# Patient Record
Sex: Male | Born: 1940
Health system: Southern US, Community
[De-identification: ages and names within clinical notes are randomized; demographics above are authoritative.]

## PROBLEM LIST (undated history)

## (undated) DIAGNOSIS — I10 Essential (primary) hypertension: Secondary | ICD-10-CM

## (undated) DIAGNOSIS — C8590 Non-Hodgkin lymphoma, unspecified, unspecified site: Secondary | ICD-10-CM

## (undated) DIAGNOSIS — I639 Cerebral infarction, unspecified: Secondary | ICD-10-CM

## (undated) DIAGNOSIS — K219 Gastro-esophageal reflux disease without esophagitis: Secondary | ICD-10-CM

## (undated) DIAGNOSIS — C801 Malignant (primary) neoplasm, unspecified: Secondary | ICD-10-CM

## (undated) DIAGNOSIS — E7800 Pure hypercholesterolemia, unspecified: Secondary | ICD-10-CM

## (undated) DIAGNOSIS — I509 Heart failure, unspecified: Secondary | ICD-10-CM

## (undated) DIAGNOSIS — F0280 Dementia in other diseases classified elsewhere without behavioral disturbance: Secondary | ICD-10-CM

## (undated) HISTORY — DX: Heart failure, unspecified: I50.9

## (undated) HISTORY — PX: PROSTATE SURGERY: SHX751

## (undated) HISTORY — PX: EYE SURGERY: SHX253

## (undated) HISTORY — DX: Non-Hodgkin lymphoma, unspecified, unspecified site: C85.90

## (undated) HISTORY — PX: APPENDECTOMY: SHX54

## (undated) HISTORY — PX: TONSILLECTOMY: SUR1361

## (undated) HISTORY — PX: PACEMAKER INSERTION: SHX728

---

## 1998-12-14 ENCOUNTER — Encounter: Payer: Self-pay | Admitting: Cardiology

## 1998-12-14 ENCOUNTER — Encounter: Payer: Self-pay | Admitting: Emergency Medicine

## 1998-12-14 ENCOUNTER — Observation Stay (HOSPITAL_COMMUNITY): Admission: EM | Admit: 1998-12-14 | Discharge: 1998-12-14 | Payer: Self-pay | Admitting: Emergency Medicine

## 2012-07-06 ENCOUNTER — Emergency Department (HOSPITAL_BASED_OUTPATIENT_CLINIC_OR_DEPARTMENT_OTHER): Admission: EM | Admit: 2012-07-06 | Discharge: 2012-07-06 | Discharge status: Against Medical Advice | Payer: MEDICARE

## 2012-07-06 ENCOUNTER — Encounter (HOSPITAL_BASED_OUTPATIENT_CLINIC_OR_DEPARTMENT_OTHER): Payer: Self-pay | Admitting: *Deleted

## 2012-07-06 ENCOUNTER — Emergency Department (HOSPITAL_BASED_OUTPATIENT_CLINIC_OR_DEPARTMENT_OTHER): Payer: Medicare Other

## 2012-07-06 ENCOUNTER — Emergency Department (HOSPITAL_BASED_OUTPATIENT_CLINIC_OR_DEPARTMENT_OTHER)
Admission: EM | Admit: 2012-07-06 | Discharge: 2012-07-07 | Disposition: A | Discharge status: Home / Self Care | Payer: Medicare Other | Attending: Emergency Medicine | Admitting: Emergency Medicine

## 2012-07-06 DIAGNOSIS — R011 Cardiac murmur, unspecified: Secondary | ICD-10-CM | POA: Insufficient documentation

## 2012-07-06 DIAGNOSIS — I10 Essential (primary) hypertension: Secondary | ICD-10-CM | POA: Insufficient documentation

## 2012-07-06 DIAGNOSIS — R109 Unspecified abdominal pain: Secondary | ICD-10-CM | POA: Insufficient documentation

## 2012-07-06 DIAGNOSIS — N201 Calculus of ureter: Secondary | ICD-10-CM | POA: Insufficient documentation

## 2012-07-06 DIAGNOSIS — E119 Type 2 diabetes mellitus without complications: Secondary | ICD-10-CM | POA: Insufficient documentation

## 2012-07-06 DIAGNOSIS — Z79899 Other long term (current) drug therapy: Secondary | ICD-10-CM | POA: Insufficient documentation

## 2012-07-06 HISTORY — DX: Cerebral infarction, unspecified: I63.9

## 2012-07-06 HISTORY — DX: Pure hypercholesterolemia, unspecified: E78.00

## 2012-07-06 HISTORY — DX: Gastro-esophageal reflux disease without esophagitis: K21.9

## 2012-07-06 HISTORY — DX: Essential (primary) hypertension: I10

## 2012-07-06 HISTORY — DX: Malignant (primary) neoplasm, unspecified: C80.1

## 2012-07-06 LAB — CBC WITH DIFFERENTIAL/PLATELET
Basophils Absolute: 0 10*3/uL (ref 0.0–0.1)
Basophils Relative: 0 % (ref 0–1)
Eosinophils Absolute: 0.2 10*3/uL (ref 0.0–0.7)
Eosinophils Relative: 2 % (ref 0–5)
HCT: 39.6 % (ref 39.0–52.0)
Hemoglobin: 13.1 g/dL (ref 13.0–17.0)
Lymphocytes Relative: 15 % (ref 12–46)
Lymphs Abs: 1.1 10*3/uL (ref 0.7–4.0)
MCH: 27.3 pg (ref 26.0–34.0)
MCHC: 33.1 g/dL (ref 30.0–36.0)
MCV: 82.7 fL (ref 78.0–100.0)
Monocytes Absolute: 0.5 10*3/uL (ref 0.1–1.0)
Monocytes Relative: 7 % (ref 3–12)
Neutro Abs: 5.8 10*3/uL (ref 1.7–7.7)
Neutrophils Relative %: 76 % (ref 43–77)
Platelets: 235 10*3/uL (ref 150–400)
RBC: 4.79 MIL/uL (ref 4.22–5.81)
RDW: 14.7 % (ref 11.5–15.5)
WBC: 7.6 10*3/uL (ref 4.0–10.5)

## 2012-07-06 LAB — COMPREHENSIVE METABOLIC PANEL
ALT: 16 U/L (ref 0–53)
AST: 20 U/L (ref 0–37)
Albumin: 4.6 g/dL (ref 3.5–5.2)
Alkaline Phosphatase: 55 U/L (ref 39–117)
BUN: 22 mg/dL (ref 6–23)
CO2: 24 mEq/L (ref 19–32)
Calcium: 10.3 mg/dL (ref 8.4–10.5)
Chloride: 101 mEq/L (ref 96–112)
Creatinine, Ser: 1 mg/dL (ref 0.50–1.35)
GFR calc Af Amer: 87 mL/min — ABNORMAL LOW (ref >90)
GFR calc non Af Amer: 75 mL/min — ABNORMAL LOW (ref >90)
Glucose, Bld: 181 mg/dL — ABNORMAL HIGH (ref 70–99)
Potassium: 3.8 mEq/L (ref 3.5–5.1)
Sodium: 138 mEq/L (ref 135–145)
Total Bilirubin: 0.4 mg/dL (ref 0.3–1.2)
Total Protein: 8.3 g/dL (ref 6.0–8.3)

## 2012-07-06 LAB — URINALYSIS, ROUTINE W REFLEX MICROSCOPIC
Bilirubin Urine: NEGATIVE
Glucose, UA: NEGATIVE mg/dL
Ketones, ur: NEGATIVE mg/dL
Leukocytes, UA: NEGATIVE
Nitrite: NEGATIVE
Protein, ur: >300 mg/dL — AB
Specific Gravity, Urine: 1.018 (ref 1.005–1.030)
Urobilinogen, UA: 0.2 mg/dL (ref 0.0–1.0)
pH: 5.5 (ref 5.0–8.0)

## 2012-07-06 LAB — LIPASE, BLOOD: Lipase: 38 U/L (ref 11–59)

## 2012-07-06 LAB — URINE MICROSCOPIC-ADD ON

## 2012-07-06 MED ORDER — ONDANSETRON 8 MG PO TBDP: 8.0000 mg | ORAL_TABLET | Freq: Three times a day (TID) | ORAL | Status: DC | PRN

## 2012-07-06 MED ORDER — HYDROCODONE-ACETAMINOPHEN 5-325 MG PO TABS: 1.0000 | ORAL_TABLET | Freq: Four times a day (QID) | ORAL | Status: DC | PRN

## 2012-07-06 MED ORDER — SODIUM CHLORIDE 0.9 % IV SOLN
1000.0000 mL | INTRAVENOUS | Status: DC
  Administered 2012-07-06 (×2): 1000 mL via INTRAVENOUS

## 2012-07-06 MED ORDER — KETOROLAC TROMETHAMINE 30 MG/ML IJ SOLN
15.0000 mg | Freq: Once | INTRAMUSCULAR | Status: AC
  Administered 2012-07-06: 15 mg via INTRAVENOUS
  Filled 2012-07-06: qty 1

## 2012-07-06 MED ORDER — TAMSULOSIN HCL 0.4 MG PO CAPS: 0.4000 mg | ORAL_CAPSULE | Freq: Every day | ORAL | Status: DC

## 2012-07-06 NOTE — ED Notes (Signed)
Left lower quad abdominal pain x 3 hours. Sudden onset.

## 2012-07-06 NOTE — ED Provider Notes (Signed)
History     CSN: [id number]  Arrival date & time 07/06/12  2016   First MD Initiated Contact with Patient 07/06/12 2119      Chief Complaint  Patient presents with  . Abdominal Pain     HPI Comments: It started this evening before he started having dinner.  The pain has been located in the left lower abdomen.  Patient is a 71 y.o. male presenting with abdominal pain. The history is provided by the patient.  Abdominal Pain The primary symptoms of the illness include abdominal pain. The primary symptoms of the illness do not include fever, fatigue, vomiting, diarrhea or dysuria. The current episode started 3 to 5 hours ago. The onset of the illness was sudden. The problem has been gradually improving.  The abdominal pain is located in the LLQ. The abdominal pain does not radiate. The abdominal pain is relieved by nothing.  Additional symptoms associated with the illness include frequency. Symptoms associated with the illness do not include anorexia or back pain.  Pt has had kidney stones in the past.  Past Medical History  Diagnosis Date  . Hypertension   . High cholesterol   . Diabetes mellitus   . Stroke   . GERD (gastroesophageal reflux disease)   . Cancer     Past Surgical History  Procedure Date  . Prostate surgery   . Appendectomy   . Tonsillectomy     No family history on file.  History  Substance Use Topics  . Smoking status: Never Smoker   . Smokeless tobacco: Not on file  . Alcohol Use: Yes      Review of Systems  Constitutional: Negative for fever and fatigue.  Gastrointestinal: Positive for abdominal pain. Negative for vomiting, diarrhea and anorexia.  Genitourinary: Positive for frequency. Negative for dysuria.  Musculoskeletal: Negative for back pain.  All other systems reviewed and are negative.    Allergies  Review of patient's allergies indicates no known allergies.  Home Medications   Current Outpatient Rx  Name Route Sig Dispense  Refill  . AMLODIPINE BESYLATE 10 MG PO TABS Oral Take 10 mg by mouth daily.    Marland Kitchen CARVEDILOL 12.5 MG PO TABS Oral Take 12.5 mg by mouth 2 (two) times daily with a meal.    . FENOFIBRATE 160 MG PO TABS Oral Take 160 mg by mouth daily.    Marland Kitchen GLIPIZIDE 10 MG PO TABS Oral Take 10 mg by mouth 2 (two) times daily before a meal.    . LISINOPRIL-HYDROCHLOROTHIAZIDE 20-25 MG PO TABS Oral Take 1 tablet by mouth daily.    . MELOXICAM 7.5 MG PO TABS Oral Take 7.5 mg by mouth daily.    Marland Kitchen METFORMIN HCL ER (MOD) 1000 MG PO TB24 Oral Take 1,000 mg by mouth daily with breakfast.    . OSTEO BI-FLEX JOINT SHIELD PO TABS Oral Take 2 tablets by mouth daily.    Marland Kitchen ONE-DAILY MULTI VITAMINS PO TABS Oral Take 1 tablet by mouth daily.    Marland Kitchen FISH OIL PO Oral Take 2 capsules by mouth daily.    . SELENIUM 50 MCG PO TABS Oral Take 50 mcg by mouth daily.    Marland Kitchen SIMVASTATIN 40 MG PO TABS Oral Take 40 mg by mouth every evening.      BP 191/94  Pulse 78  Temp 98.1 F (36.7 C) (Oral)  Resp 20  SpO2 99%  Physical Exam  Nursing note and vitals reviewed. Constitutional: He appears well-developed and well-nourished. No  distress.  HENT:  Head: Normocephalic and atraumatic.  Right Ear: External ear normal.  Left Ear: External ear normal.  Eyes: Conjunctivae normal are normal. Right eye exhibits no discharge. Left eye exhibits no discharge. No scleral icterus.  Neck: Neck supple. No tracheal deviation present.  Cardiovascular: Normal rate, regular rhythm and intact distal pulses.   Murmur heard.      Extra systoles   Pulmonary/Chest: Effort normal and breath sounds normal. No stridor. No respiratory distress. He has no wheezes. He has no rales.  Abdominal: Soft. Bowel sounds are normal. He exhibits no distension. There is no tenderness. There is no rebound and no guarding.  Musculoskeletal: He exhibits no edema and no tenderness.  Neurological: He is alert. He has normal strength. No sensory deficit. Cranial nerve deficit:   no gross defecits noted. He exhibits normal muscle tone. He displays no seizure activity. Coordination normal.  Skin: Skin is warm and dry. No rash noted.  Psychiatric: He has a normal mood and affect.    ED Course  Procedures (including critical care time)  Medications  glipiZIDE (GLUCOTROL) 10 MG tablet (not administered)  metFORMIN (GLUMETZA) 1000 MG (MOD) 24 hr tablet (not administered)  carvedilol (COREG) 12.5 MG tablet (not administered)  amLODipine (NORVASC) 10 MG tablet (not administered)  simvastatin (ZOCOR) 40 MG tablet (not administered)  meloxicam (MOBIC) 7.5 MG tablet (not administered)  lisinopril-hydrochlorothiazide (PRINZIDE,ZESTORETIC) 20-25 MG per tablet (not administered)  fenofibrate 160 MG tablet (not administered)  Omega-3 Fatty Acids (FISH OIL PO) (not administered)  Multiple Vitamin (MULTIVITAMIN) tablet (not administered)  selenium 50 MCG TABS (not administered)  Misc Natural Products (OSTEO BI-FLEX JOINT SHIELD) TABS (not administered)  0.9 %  sodium chloride infusion (1000 mL Intravenous New Bag/Given 07/06/12 2150)  ketorolac (TORADOL) 30 MG/ML injection 15 mg (not administered)  HYDROcodone-acetaminophen (NORCO) 5-325 MG per tablet (not administered)  ondansetron (ZOFRAN ODT) 8 MG disintegrating tablet (not administered)  Tamsulosin HCl (FLOMAX) 0.4 MG CAPS (not administered)    Labs Reviewed  URINALYSIS, ROUTINE W REFLEX MICROSCOPIC - Abnormal; Notable for the following:    Hgb urine dipstick SMALL (*)     Protein, ur >300 (*)     All other components within normal limits  COMPREHENSIVE METABOLIC PANEL - Abnormal; Notable for the following:    Glucose, Bld 181 (*)     GFR calc non Af Amer 75 (*)     GFR calc Af Amer 87 (*)     All other components within normal limits  URINE MICROSCOPIC-ADD ON  LIPASE, BLOOD  CBC WITH DIFFERENTIAL   Ct Abdomen Pelvis Wo Contrast  07/06/2012  *RADIOLOGY REPORT*  Clinical Data: Lower abdominal pain.  CT ABDOMEN  AND PELVIS WITHOUT CONTRAST  Technique:  Multidetector CT imaging of the abdomen and pelvis was performed following the standard protocol without intravenous contrast.  Comparison: None.  Findings: The lung bases are clear.  No pleural or pericardial effusion.   The patient has moderate to moderately severe left hydronephrosis with stranding about the left kidney and ureter due to a 0.5 cm stone just proximal to the UVJ.  The patient has three additional nonobstructing left renal stones measuring 0.3-0.5 cm.  No right renal stones are identified.  The kidneys are otherwise unremarkable.  The gallbladder, liver, adrenal glands, spleen and pancreas appear normal.  Extensive atherosclerosis is seen in a nonaneurysmal aorta.  Urinary bladder is unremarkable.  Surgical clips in the pelvis are identified with the patient status post prostatectomy. The stomach  and small bowel and large bowel are unremarkable. There is no focal bony abnormality with degenerative changes seen in the lower lumbar spine.  IMPRESSION:  1.  Moderate to moderately severe left hydronephrosis due to a 0.5 cm stone just proximal to the UVJ.  Three nonobstructing stones are identified in the left kidney. 2.  Extensive atherosclerosis. 3.  Status post prostatectomy.   Original Report Authenticated By: Arvid Right. D'ALESSIO, M.D.      1. Ureteral stone       MDM  Pt has left sided ureteral stone.  Discussed findings with the patient.  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  Will dc home with pain medications, flomax and antinausea medications.   Pt instructed to call his urologist tomorrow.        Kathalene Frames, MD 07/06/12 [phone]

## 2012-08-31 HISTORY — PX: PACEMAKER INSERTION: SHX728

## 2015-10-29 DIAGNOSIS — K5730 Diverticulosis of large intestine without perforation or abscess without bleeding: Secondary | ICD-10-CM | POA: Insufficient documentation

## 2015-10-29 DIAGNOSIS — R0989 Other specified symptoms and signs involving the circulatory and respiratory systems: Secondary | ICD-10-CM | POA: Insufficient documentation

## 2015-10-29 DIAGNOSIS — Z8673 Personal history of transient ischemic attack (TIA), and cerebral infarction without residual deficits: Secondary | ICD-10-CM | POA: Insufficient documentation

## 2015-10-29 DIAGNOSIS — M179 Osteoarthritis of knee, unspecified: Secondary | ICD-10-CM | POA: Insufficient documentation

## 2015-10-29 DIAGNOSIS — M5137 Other intervertebral disc degeneration, lumbosacral region: Secondary | ICD-10-CM | POA: Insufficient documentation

## 2015-10-29 DIAGNOSIS — M25561 Pain in right knee: Secondary | ICD-10-CM | POA: Insufficient documentation

## 2015-10-29 DIAGNOSIS — Z961 Presence of intraocular lens: Secondary | ICD-10-CM | POA: Insufficient documentation

## 2015-10-29 DIAGNOSIS — M1710 Unilateral primary osteoarthritis, unspecified knee: Secondary | ICD-10-CM | POA: Insufficient documentation

## 2015-10-29 DIAGNOSIS — E782 Mixed hyperlipidemia: Secondary | ICD-10-CM | POA: Insufficient documentation

## 2015-10-29 DIAGNOSIS — I10 Essential (primary) hypertension: Secondary | ICD-10-CM | POA: Insufficient documentation

## 2015-10-29 DIAGNOSIS — I442 Atrioventricular block, complete: Secondary | ICD-10-CM | POA: Insufficient documentation

## 2015-10-29 DIAGNOSIS — Z86018 Personal history of other benign neoplasm: Secondary | ICD-10-CM | POA: Insufficient documentation

## 2015-10-29 DIAGNOSIS — H9190 Unspecified hearing loss, unspecified ear: Secondary | ICD-10-CM | POA: Insufficient documentation

## 2015-10-29 DIAGNOSIS — Z8546 Personal history of malignant neoplasm of prostate: Secondary | ICD-10-CM | POA: Insufficient documentation

## 2015-10-29 DIAGNOSIS — E781 Pure hyperglyceridemia: Secondary | ICD-10-CM | POA: Insufficient documentation

## 2015-10-29 DIAGNOSIS — M47812 Spondylosis without myelopathy or radiculopathy, cervical region: Secondary | ICD-10-CM | POA: Insufficient documentation

## 2015-10-30 DIAGNOSIS — E781 Pure hyperglyceridemia: Secondary | ICD-10-CM | POA: Diagnosis not present

## 2015-10-30 DIAGNOSIS — I10 Essential (primary) hypertension: Secondary | ICD-10-CM | POA: Diagnosis not present

## 2015-10-30 DIAGNOSIS — E119 Type 2 diabetes mellitus without complications: Secondary | ICD-10-CM | POA: Diagnosis not present

## 2015-10-31 DIAGNOSIS — E781 Pure hyperglyceridemia: Secondary | ICD-10-CM | POA: Diagnosis not present

## 2015-12-19 DIAGNOSIS — Z85828 Personal history of other malignant neoplasm of skin: Secondary | ICD-10-CM | POA: Diagnosis not present

## 2015-12-19 DIAGNOSIS — Z08 Encounter for follow-up examination after completed treatment for malignant neoplasm: Secondary | ICD-10-CM | POA: Diagnosis not present

## 2016-01-21 DIAGNOSIS — I498 Other specified cardiac arrhythmias: Secondary | ICD-10-CM | POA: Diagnosis not present

## 2016-03-11 DIAGNOSIS — E119 Type 2 diabetes mellitus without complications: Secondary | ICD-10-CM | POA: Diagnosis not present

## 2016-03-11 DIAGNOSIS — R413 Other amnesia: Secondary | ICD-10-CM | POA: Diagnosis not present

## 2016-03-11 DIAGNOSIS — I10 Essential (primary) hypertension: Secondary | ICD-10-CM | POA: Diagnosis not present

## 2016-03-25 DIAGNOSIS — R413 Other amnesia: Secondary | ICD-10-CM | POA: Diagnosis not present

## 2016-04-11 ENCOUNTER — Encounter (HOSPITAL_BASED_OUTPATIENT_CLINIC_OR_DEPARTMENT_OTHER): Payer: Self-pay

## 2016-04-11 ENCOUNTER — Emergency Department (HOSPITAL_BASED_OUTPATIENT_CLINIC_OR_DEPARTMENT_OTHER)
Admission: EM | Admit: 2016-04-11 | Discharge: 2016-04-11 | Disposition: A | Discharge status: Home / Self Care | Payer: PPO | Attending: Emergency Medicine | Admitting: Emergency Medicine

## 2016-04-11 ENCOUNTER — Other Ambulatory Visit: Payer: Self-pay

## 2016-04-11 DIAGNOSIS — Z7984 Long term (current) use of oral hypoglycemic drugs: Secondary | ICD-10-CM | POA: Diagnosis not present

## 2016-04-11 DIAGNOSIS — Z8673 Personal history of transient ischemic attack (TIA), and cerebral infarction without residual deficits: Secondary | ICD-10-CM | POA: Insufficient documentation

## 2016-04-11 DIAGNOSIS — H81399 Other peripheral vertigo, unspecified ear: Secondary | ICD-10-CM | POA: Diagnosis not present

## 2016-04-11 DIAGNOSIS — Z79899 Other long term (current) drug therapy: Secondary | ICD-10-CM | POA: Insufficient documentation

## 2016-04-11 DIAGNOSIS — I10 Essential (primary) hypertension: Secondary | ICD-10-CM | POA: Diagnosis not present

## 2016-04-11 DIAGNOSIS — Z87891 Personal history of nicotine dependence: Secondary | ICD-10-CM | POA: Insufficient documentation

## 2016-04-11 DIAGNOSIS — R42 Dizziness and giddiness: Secondary | ICD-10-CM | POA: Diagnosis present

## 2016-04-11 DIAGNOSIS — E119 Type 2 diabetes mellitus without complications: Secondary | ICD-10-CM | POA: Diagnosis not present

## 2016-04-11 LAB — CBG MONITORING, ED: Glucose-Capillary: 216 mg/dL — ABNORMAL HIGH (ref 65–99)

## 2016-04-11 MED ORDER — MECLIZINE HCL 25 MG PO TABS
25.0000 mg | ORAL_TABLET | Freq: Once | ORAL | Status: AC
  Administered 2016-04-11: 25 mg via ORAL
  Filled 2016-04-11: qty 1

## 2016-04-11 MED ORDER — MECLIZINE HCL 25 MG PO TABS: 25.0000 mg | ORAL_TABLET | Freq: Three times a day (TID) | ORAL | Status: DC | PRN

## 2016-04-11 NOTE — ED Provider Notes (Signed)
CSN: [id number]     Arrival date & time 04/11/16  2040 History  By signing my name below, I, Roxine Caddy, attest that this documentation has been prepared under the direction and in the presence of Deno Etienne, DO.  Electronically signed: Roxine Caddy, ED Scribe. 04/11/2016. 10:08 PM.   Chief Complaint  Patient presents with  . Dizziness   The history is provided by the patient. No language interpreter was used.   HPI Comments: Kenneth Lyons is a 75 y.o. male with PMHx of HTN, high cholesterol, DM, and stroke who presents to the Emergency Department complaining of sudden onset, intermittment dizziness for the past 4-5 days.Pt reports associated vomiting (x3) which began around 6:30 this evening. He states he has been able to hold food and drink down aside from vomiting earlier today. Pt states dizziness occurs when bending over or standing up. He states the only time symptoms do not occur is when he is sitting still. Pt notes he hit his head on a car door 2 weeks ago.He denies cough, congestion, SOB, diarrhea, headache, neck pain, chest pain, or abdominal pain. Pt has a pacemaker inserted.  Past Medical History  Diagnosis Date  . Hypertension   . High cholesterol   . Diabetes mellitus   . Stroke ([place])   . GERD (gastroesophageal reflux disease)   . Cancer [facility])    Past Surgical History  Procedure Laterality Date  . Prostate surgery    . Appendectomy    . Tonsillectomy    . Pacemaker insertion     No family history on file. Social History  Substance Use Topics  . Smoking status: Former Research scientist (life sciences)  . Smokeless tobacco: None  . Alcohol Use: Yes     Comment: occ    Review of Systems  Constitutional: Negative for fever and chills.  HENT: Negative for congestion and facial swelling.   Eyes: Negative for discharge and visual disturbance.  Respiratory: Negative for cough and shortness of breath.   Cardiovascular: Negative for chest pain and palpitations.  Gastrointestinal: Positive for  vomiting. Negative for abdominal pain and diarrhea.  Musculoskeletal: Negative for myalgias, arthralgias and neck pain.  Skin: Negative for color change and rash.  Neurological: Positive for dizziness. Negative for tremors, syncope and headaches.  Psychiatric/Behavioral: Negative for confusion and dysphoric mood.   Allergies  Review of patient's allergies indicates no known allergies.  Home Medications   Prior to Admission medications   Medication Sig Start Date End Date Taking? Authorizing Provider  amLODipine (NORVASC) 10 MG tablet Take 10 mg by mouth daily.    Historical Provider, MD  carvedilol (COREG) 12.5 MG tablet Take 12.5 mg by mouth 2 (two) times daily with a meal.    Historical Provider, MD  fenofibrate 160 MG tablet Take 160 mg by mouth daily.    Historical Provider, MD  glipiZIDE (GLUCOTROL) 10 MG tablet Take 10 mg by mouth 2 (two) times daily before a meal.    Historical Provider, MD  lisinopril-hydrochlorothiazide (PRINZIDE,ZESTORETIC) 20-25 MG per tablet Take 1 tablet by mouth daily.    Historical Provider, MD  meclizine (ANTIVERT) 25 MG tablet Take 1 tablet (25 mg total) by mouth 3 (three) times daily as needed for dizziness. 04/11/16   Deno Etienne, DO  meloxicam (MOBIC) 7.5 MG tablet Take 7.5 mg by mouth daily.    Historical Provider, MD  metFORMIN (GLUMETZA) 1000 MG (MOD) 24 hr tablet Take 1,000 mg by mouth daily with breakfast.    Historical Provider, MD  Misc Natural Products (OSTEO BI-FLEX JOINT SHIELD) TABS Take 2 tablets by mouth daily.    Historical Provider, MD  Multiple Vitamin (MULTIVITAMIN) tablet Take 1 tablet by mouth daily.    Historical Provider, MD  Omega-3 Fatty Acids (FISH OIL PO) Take 2 capsules by mouth daily.    Historical Provider, MD  selenium 50 MCG TABS Take 50 mcg by mouth daily.    Historical Provider, MD  simvastatin (ZOCOR) 40 MG tablet Take 40 mg by mouth every evening.    Historical Provider, MD  Tamsulosin HCl (FLOMAX) 0.4 MG CAPS Take 1  capsule (0.4 mg total) by mouth daily. 07/06/12   Dorie Rank, MD   BP 170/72 mmHg  Pulse 71  Temp(Src) 98.1 F (36.7 C) (Oral)  Resp 18  Ht 5\' 7"  (1.702 m)  Wt 190 lb (86.183 kg)  BMI 29.75 kg/m2  SpO2 99% Physical Exam  Constitutional: He is oriented to person, place, and time. He appears well-developed and well-nourished.  HENT:  Head: Normocephalic and atraumatic.  Eyes: EOM are normal. Pupils are equal, round, and reactive to light.  Neck: Normal range of motion. Neck supple. No JVD present.  Cardiovascular: Normal rate and regular rhythm.  Exam reveals no gallop and no friction rub.   No murmur heard. Pulmonary/Chest: No respiratory distress. He has no wheezes.  Abdominal: He exhibits no distension. There is no rebound and no guarding.  Musculoskeletal: Normal range of motion.  Neurological: He is alert and oriented to person, place, and time.  Benign neurological exam.  Skin: No rash noted. No pallor.  Psychiatric: He has a normal mood and affect. His behavior is normal.  Nursing note and vitals reviewed.   ED Course  Procedures  DIAGNOSTIC STUDIES: Oxygen Saturation is 100% on RA, normal by my interpretation.  COORDINATION OF CARE: 9:44 PM Discussed treatment plan which includes obtaining a EKG, CBG monitoring, and prescribing meclizine with pt at bedside and pt agreed to plan.  Labs Review Labs Reviewed  CBG MONITORING, ED - Abnormal; Notable for the following:    Glucose-Capillary 216 (*)    All other components within normal limits   Imaging Review No results found. I have personally reviewed and evaluated these images and lab results as part of my medical decision-making.   EKG Interpretation None      MDM   Final diagnoses:  Peripheral vertigo, unspecified laterality    75 yo M With a chief complaints of unsteadiness on his feet. Visit come and go with head movement. Patient currently has no symptoms. Benign neurologic exam. Suspect this is likely  peripheral in nature. Follow-up with family doctor. Start on meclizine.  I personally performed the services described in this documentation, which was scribed in my presence. The recorded information has been reviewed and is accurate.  11:32 PM:  I have discussed the diagnosis/risks/treatment options with the patient and family and believe the pt to be eligible for discharge home to follow-up with PCP. We also discussed returning to the ED immediately if new or worsening sx occur. We discussed the sx which are most concerning (e.g., sudden worsening pain, fever, inability to tolerate by mouth) that necessitate immediate return. Medications administered to the patient during their visit and any new prescriptions provided to the patient are listed below.  Medications given during this visit Medications  meclizine (ANTIVERT) tablet 25 mg (25 mg Oral Given 04/11/16 2219)    Discharge Medication List as of 04/11/2016 10:09 PM    START taking  these medications   Details  meclizine (ANTIVERT) 25 MG tablet Take 1 tablet (25 mg total) by mouth 3 (three) times daily as needed for dizziness., Starting 04/11/2016, Until Discontinued, Print        The patient appears reasonably screen and/or stabilized for discharge and I doubt any other medical condition or other [facility] requiring further screening, evaluation, or treatment in the ED at this time prior to discharge.     Deno Etienne, DO 04/11/16 2333

## 2016-04-11 NOTE — ED Notes (Signed)
Dizziness x 3-4 days-denies HA, CP-NAD-reports steady gait-was brought to triage via w/c

## 2016-04-11 NOTE — Discharge Instructions (Signed)
Benign Positional Vertigo Vertigo is the feeling that you or your surroundings are moving when they are not. Benign positional vertigo is the most common form of vertigo. The cause of this condition is not serious (is benign). This condition is triggered by certain movements and positions (is positional). This condition can be dangerous if it occurs while you are doing something that could endanger you or others, such as driving.  CAUSES In many cases, the cause of this condition is not known. It may be caused by a disturbance in an area of the inner ear that helps your brain to sense movement and balance. This disturbance can be caused by a viral infection (labyrinthitis), head injury, or repetitive motion. RISK FACTORS This condition is more likely to develop in:  Women.  People who are 50 years of age or older. SYMPTOMS Symptoms of this condition usually happen when you move your head or your eyes in different directions. Symptoms may start suddenly, and they usually last for less than a minute. Symptoms may include:  Loss of balance and falling.  Feeling like you are spinning or moving.  Feeling like your surroundings are spinning or moving.  Nausea and vomiting.  Blurred vision.  Dizziness.  Involuntary eye movement (nystagmus). Symptoms can be mild and cause only slight annoyance, or they can be severe and interfere with daily life. Episodes of benign positional vertigo may return (recur) over time, and they may be triggered by certain movements. Symptoms may improve over time. DIAGNOSIS This condition is usually diagnosed by medical history and a physical exam of the head, neck, and ears. You may be referred to a health care provider who specializes in ear, nose, and throat (ENT) problems (otolaryngologist) or a provider who specializes in disorders of the nervous system (neurologist). You may have additional testing, including:  MRI.  A CT scan.  Eye movement tests. Your  health care provider may ask you to change positions quickly while he or she watches you for symptoms of benign positional vertigo, such as nystagmus. Eye movement may be tested with an electronystagmogram (ENG), caloric stimulation, the Dix-Hallpike test, or the roll test.  An electroencephalogram (EEG). This records electrical activity in your brain.  Hearing tests. TREATMENT Usually, your health care provider will treat this by moving your head in specific positions to adjust your inner ear back to normal. Surgery may be needed in severe cases, but this is rare. In some cases, benign positional vertigo may resolve on its own in 2-4 weeks. HOME CARE INSTRUCTIONS Safety  Move slowly.Avoid sudden body or head movements.  Avoid driving.  Avoid operating heavy machinery.  Avoid doing any tasks that would be dangerous to you or others if a vertigo episode would occur.  If you have trouble walking or keeping your balance, try using a cane for stability. If you feel dizzy or unstable, sit down right away.  Return to your normal activities as told by your health care provider. Ask your health care provider what activities are safe for you. General Instructions  Take over-the-counter and prescription medicines only as told by your health care provider.  Avoid certain positions or movements as told by your health care provider.  Drink enough fluid to keep your urine clear or pale yellow.  Keep all follow-up visits as told by your health care provider. This is important. SEEK MEDICAL CARE IF:  You have a fever.  Your condition gets worse or you develop new symptoms.  Your family or friends   notice any behavioral changes.  Your nausea or vomiting gets worse.  You have numbness or a "pins and needles" sensation. SEEK IMMEDIATE MEDICAL CARE IF:  You have difficulty speaking or moving.  You are always dizzy.  You faint.  You develop severe headaches.  You have weakness in your  legs or arms.  You have changes in your hearing or vision.  You develop a stiff neck.  You develop sensitivity to light.   This information is not intended to replace advice given to you by your health care provider. Make sure you discuss any questions you have with your health care provider.   Document Released: 07/14/2006 Document Revised: 06/27/2015 Document Reviewed: 01/29/2015 Elsevier Interactive Patient Education 2016 [employer]  

## 2016-04-16 DIAGNOSIS — I10 Essential (primary) hypertension: Secondary | ICD-10-CM | POA: Diagnosis not present

## 2016-04-16 DIAGNOSIS — Z8673 Personal history of transient ischemic attack (TIA), and cerebral infarction without residual deficits: Secondary | ICD-10-CM | POA: Diagnosis not present

## 2016-04-16 DIAGNOSIS — Z950 Presence of cardiac pacemaker: Secondary | ICD-10-CM | POA: Diagnosis not present

## 2016-04-16 DIAGNOSIS — H8113 Benign paroxysmal vertigo, bilateral: Secondary | ICD-10-CM | POA: Insufficient documentation

## 2016-04-16 DIAGNOSIS — I442 Atrioventricular block, complete: Secondary | ICD-10-CM | POA: Diagnosis not present

## 2016-04-16 DIAGNOSIS — Z8546 Personal history of malignant neoplasm of prostate: Secondary | ICD-10-CM | POA: Diagnosis not present

## 2016-04-16 DIAGNOSIS — E1129 Type 2 diabetes mellitus with other diabetic kidney complication: Secondary | ICD-10-CM | POA: Diagnosis not present

## 2016-04-16 DIAGNOSIS — E11649 Type 2 diabetes mellitus with hypoglycemia without coma: Secondary | ICD-10-CM | POA: Diagnosis not present

## 2016-04-16 DIAGNOSIS — R809 Proteinuria, unspecified: Secondary | ICD-10-CM | POA: Diagnosis not present

## 2016-04-16 DIAGNOSIS — Z45018 Encounter for adjustment and management of other part of cardiac pacemaker: Secondary | ICD-10-CM | POA: Insufficient documentation

## 2016-05-15 DIAGNOSIS — E781 Pure hyperglyceridemia: Secondary | ICD-10-CM | POA: Diagnosis not present

## 2016-06-03 DIAGNOSIS — E11649 Type 2 diabetes mellitus with hypoglycemia without coma: Secondary | ICD-10-CM | POA: Diagnosis not present

## 2016-06-03 DIAGNOSIS — Z8673 Personal history of transient ischemic attack (TIA), and cerebral infarction without residual deficits: Secondary | ICD-10-CM | POA: Diagnosis not present

## 2016-06-03 DIAGNOSIS — L570 Actinic keratosis: Secondary | ICD-10-CM | POA: Diagnosis not present

## 2016-06-03 DIAGNOSIS — I10 Essential (primary) hypertension: Secondary | ICD-10-CM | POA: Diagnosis not present

## 2016-06-03 DIAGNOSIS — F0390 Unspecified dementia without behavioral disturbance: Secondary | ICD-10-CM | POA: Diagnosis not present

## 2016-06-25 DIAGNOSIS — L570 Actinic keratosis: Secondary | ICD-10-CM | POA: Diagnosis not present

## 2016-06-25 DIAGNOSIS — C44319 Basal cell carcinoma of skin of other parts of face: Secondary | ICD-10-CM | POA: Diagnosis not present

## 2016-06-25 DIAGNOSIS — Z85828 Personal history of other malignant neoplasm of skin: Secondary | ICD-10-CM | POA: Diagnosis not present

## 2016-06-25 DIAGNOSIS — D485 Neoplasm of uncertain behavior of skin: Secondary | ICD-10-CM | POA: Diagnosis not present

## 2016-07-08 DIAGNOSIS — Z8673 Personal history of transient ischemic attack (TIA), and cerebral infarction without residual deficits: Secondary | ICD-10-CM | POA: Diagnosis not present

## 2016-07-08 DIAGNOSIS — F0390 Unspecified dementia without behavioral disturbance: Secondary | ICD-10-CM | POA: Diagnosis not present

## 2016-07-15 DIAGNOSIS — R9082 White matter disease, unspecified: Secondary | ICD-10-CM | POA: Diagnosis not present

## 2016-07-21 DIAGNOSIS — F0280 Dementia in other diseases classified elsewhere without behavioral disturbance: Secondary | ICD-10-CM | POA: Diagnosis not present

## 2016-07-21 DIAGNOSIS — G301 Alzheimer's disease with late onset: Secondary | ICD-10-CM | POA: Diagnosis not present

## 2016-07-22 DIAGNOSIS — H02831 Dermatochalasis of right upper eyelid: Secondary | ICD-10-CM | POA: Diagnosis not present

## 2016-07-22 DIAGNOSIS — H11152 Pinguecula, left eye: Secondary | ICD-10-CM | POA: Diagnosis not present

## 2016-07-22 DIAGNOSIS — H02834 Dermatochalasis of left upper eyelid: Secondary | ICD-10-CM | POA: Diagnosis not present

## 2016-07-22 DIAGNOSIS — E119 Type 2 diabetes mellitus without complications: Secondary | ICD-10-CM | POA: Diagnosis not present

## 2016-07-22 DIAGNOSIS — H43393 Other vitreous opacities, bilateral: Secondary | ICD-10-CM | POA: Diagnosis not present

## 2016-07-22 DIAGNOSIS — H524 Presbyopia: Secondary | ICD-10-CM | POA: Diagnosis not present

## 2016-07-23 DIAGNOSIS — Z0000 Encounter for general adult medical examination without abnormal findings: Secondary | ICD-10-CM | POA: Diagnosis not present

## 2016-07-23 DIAGNOSIS — E1129 Type 2 diabetes mellitus with other diabetic kidney complication: Secondary | ICD-10-CM | POA: Diagnosis not present

## 2016-07-23 DIAGNOSIS — I10 Essential (primary) hypertension: Secondary | ICD-10-CM | POA: Diagnosis not present

## 2016-07-23 DIAGNOSIS — R809 Proteinuria, unspecified: Secondary | ICD-10-CM | POA: Diagnosis not present

## 2016-08-07 DIAGNOSIS — R809 Proteinuria, unspecified: Secondary | ICD-10-CM | POA: Diagnosis not present

## 2016-08-07 DIAGNOSIS — E1129 Type 2 diabetes mellitus with other diabetic kidney complication: Secondary | ICD-10-CM | POA: Diagnosis not present

## 2016-08-12 DIAGNOSIS — F0280 Dementia in other diseases classified elsewhere without behavioral disturbance: Secondary | ICD-10-CM | POA: Diagnosis not present

## 2016-08-12 DIAGNOSIS — G301 Alzheimer's disease with late onset: Secondary | ICD-10-CM | POA: Diagnosis not present

## 2016-08-14 DIAGNOSIS — Z950 Presence of cardiac pacemaker: Secondary | ICD-10-CM | POA: Diagnosis not present

## 2016-09-18 DIAGNOSIS — C44319 Basal cell carcinoma of skin of other parts of face: Secondary | ICD-10-CM | POA: Diagnosis not present

## 2016-09-23 DIAGNOSIS — F0280 Dementia in other diseases classified elsewhere without behavioral disturbance: Secondary | ICD-10-CM | POA: Diagnosis not present

## 2016-09-23 DIAGNOSIS — G301 Alzheimer's disease with late onset: Secondary | ICD-10-CM | POA: Diagnosis not present

## 2016-10-30 DIAGNOSIS — G301 Alzheimer's disease with late onset: Secondary | ICD-10-CM | POA: Diagnosis not present

## 2016-10-30 DIAGNOSIS — Z8673 Personal history of transient ischemic attack (TIA), and cerebral infarction without residual deficits: Secondary | ICD-10-CM | POA: Diagnosis not present

## 2016-10-30 DIAGNOSIS — F0280 Dementia in other diseases classified elsewhere without behavioral disturbance: Secondary | ICD-10-CM | POA: Diagnosis not present

## 2016-11-26 DIAGNOSIS — E1129 Type 2 diabetes mellitus with other diabetic kidney complication: Secondary | ICD-10-CM | POA: Diagnosis not present

## 2016-11-26 DIAGNOSIS — R809 Proteinuria, unspecified: Secondary | ICD-10-CM | POA: Diagnosis not present

## 2016-11-26 DIAGNOSIS — I10 Essential (primary) hypertension: Secondary | ICD-10-CM | POA: Diagnosis not present

## 2016-11-27 DIAGNOSIS — E781 Pure hyperglyceridemia: Secondary | ICD-10-CM | POA: Diagnosis not present

## 2017-01-12 DIAGNOSIS — Z950 Presence of cardiac pacemaker: Secondary | ICD-10-CM | POA: Diagnosis not present

## 2017-03-25 DIAGNOSIS — F0390 Unspecified dementia without behavioral disturbance: Secondary | ICD-10-CM | POA: Diagnosis not present

## 2017-04-01 DIAGNOSIS — D485 Neoplasm of uncertain behavior of skin: Secondary | ICD-10-CM | POA: Diagnosis not present

## 2017-04-01 DIAGNOSIS — L570 Actinic keratosis: Secondary | ICD-10-CM | POA: Diagnosis not present

## 2017-04-01 DIAGNOSIS — Z08 Encounter for follow-up examination after completed treatment for malignant neoplasm: Secondary | ICD-10-CM | POA: Diagnosis not present

## 2017-04-01 DIAGNOSIS — L820 Inflamed seborrheic keratosis: Secondary | ICD-10-CM | POA: Diagnosis not present

## 2017-04-01 DIAGNOSIS — Z85828 Personal history of other malignant neoplasm of skin: Secondary | ICD-10-CM | POA: Diagnosis not present

## 2017-04-02 DIAGNOSIS — E1129 Type 2 diabetes mellitus with other diabetic kidney complication: Secondary | ICD-10-CM | POA: Diagnosis not present

## 2017-04-02 DIAGNOSIS — I10 Essential (primary) hypertension: Secondary | ICD-10-CM | POA: Diagnosis not present

## 2017-04-02 DIAGNOSIS — R809 Proteinuria, unspecified: Secondary | ICD-10-CM | POA: Diagnosis not present

## 2017-04-02 DIAGNOSIS — R197 Diarrhea, unspecified: Secondary | ICD-10-CM | POA: Diagnosis not present

## 2017-04-13 DIAGNOSIS — Z950 Presence of cardiac pacemaker: Secondary | ICD-10-CM | POA: Diagnosis not present

## 2017-05-12 DIAGNOSIS — F0280 Dementia in other diseases classified elsewhere without behavioral disturbance: Secondary | ICD-10-CM | POA: Diagnosis not present

## 2017-05-12 DIAGNOSIS — G301 Alzheimer's disease with late onset: Secondary | ICD-10-CM | POA: Diagnosis not present

## 2017-07-09 DIAGNOSIS — I10 Essential (primary) hypertension: Secondary | ICD-10-CM | POA: Diagnosis not present

## 2017-07-09 DIAGNOSIS — Z950 Presence of cardiac pacemaker: Secondary | ICD-10-CM | POA: Diagnosis not present

## 2017-07-09 DIAGNOSIS — E785 Hyperlipidemia, unspecified: Secondary | ICD-10-CM | POA: Diagnosis not present

## 2017-07-09 DIAGNOSIS — I442 Atrioventricular block, complete: Secondary | ICD-10-CM | POA: Diagnosis not present

## 2017-08-04 DIAGNOSIS — H35371 Puckering of macula, right eye: Secondary | ICD-10-CM | POA: Diagnosis not present

## 2017-08-04 DIAGNOSIS — H43393 Other vitreous opacities, bilateral: Secondary | ICD-10-CM | POA: Insufficient documentation

## 2017-08-04 DIAGNOSIS — H02834 Dermatochalasis of left upper eyelid: Secondary | ICD-10-CM | POA: Insufficient documentation

## 2017-08-04 DIAGNOSIS — H11152 Pinguecula, left eye: Secondary | ICD-10-CM | POA: Insufficient documentation

## 2017-08-04 DIAGNOSIS — H524 Presbyopia: Secondary | ICD-10-CM | POA: Insufficient documentation

## 2017-08-04 DIAGNOSIS — H16223 Keratoconjunctivitis sicca, not specified as Sjogren's, bilateral: Secondary | ICD-10-CM | POA: Insufficient documentation

## 2017-08-04 DIAGNOSIS — Z7984 Long term (current) use of oral hypoglycemic drugs: Secondary | ICD-10-CM | POA: Diagnosis not present

## 2017-08-04 DIAGNOSIS — E119 Type 2 diabetes mellitus without complications: Secondary | ICD-10-CM | POA: Diagnosis not present

## 2017-08-04 DIAGNOSIS — H02831 Dermatochalasis of right upper eyelid: Secondary | ICD-10-CM | POA: Insufficient documentation

## 2017-08-13 DIAGNOSIS — Z950 Presence of cardiac pacemaker: Secondary | ICD-10-CM | POA: Diagnosis not present

## 2017-08-26 DIAGNOSIS — E782 Mixed hyperlipidemia: Secondary | ICD-10-CM | POA: Diagnosis not present

## 2017-08-26 DIAGNOSIS — E1129 Type 2 diabetes mellitus with other diabetic kidney complication: Secondary | ICD-10-CM | POA: Diagnosis not present

## 2017-08-26 DIAGNOSIS — E781 Pure hyperglyceridemia: Secondary | ICD-10-CM | POA: Diagnosis not present

## 2017-08-26 DIAGNOSIS — Z23 Encounter for immunization: Secondary | ICD-10-CM | POA: Diagnosis not present

## 2017-08-26 DIAGNOSIS — I10 Essential (primary) hypertension: Secondary | ICD-10-CM | POA: Diagnosis not present

## 2017-08-26 DIAGNOSIS — Z0000 Encounter for general adult medical examination without abnormal findings: Secondary | ICD-10-CM | POA: Diagnosis not present

## 2017-09-07 DIAGNOSIS — E782 Mixed hyperlipidemia: Secondary | ICD-10-CM | POA: Diagnosis not present

## 2017-09-07 DIAGNOSIS — R809 Proteinuria, unspecified: Secondary | ICD-10-CM | POA: Diagnosis not present

## 2017-09-07 DIAGNOSIS — E1129 Type 2 diabetes mellitus with other diabetic kidney complication: Secondary | ICD-10-CM | POA: Diagnosis not present

## 2017-09-07 DIAGNOSIS — G309 Alzheimer's disease, unspecified: Secondary | ICD-10-CM | POA: Diagnosis not present

## 2017-09-07 DIAGNOSIS — F0280 Dementia in other diseases classified elsewhere without behavioral disturbance: Secondary | ICD-10-CM | POA: Diagnosis not present

## 2017-09-07 DIAGNOSIS — I10 Essential (primary) hypertension: Secondary | ICD-10-CM | POA: Diagnosis not present

## 2017-10-27 DIAGNOSIS — L570 Actinic keratosis: Secondary | ICD-10-CM | POA: Diagnosis not present

## 2017-10-27 DIAGNOSIS — Z85828 Personal history of other malignant neoplasm of skin: Secondary | ICD-10-CM | POA: Diagnosis not present

## 2017-12-17 DIAGNOSIS — Z950 Presence of cardiac pacemaker: Secondary | ICD-10-CM | POA: Diagnosis not present

## 2017-12-24 DIAGNOSIS — E781 Pure hyperglyceridemia: Secondary | ICD-10-CM | POA: Diagnosis not present

## 2017-12-24 DIAGNOSIS — I10 Essential (primary) hypertension: Secondary | ICD-10-CM | POA: Diagnosis not present

## 2018-02-22 DIAGNOSIS — K621 Rectal polyp: Secondary | ICD-10-CM | POA: Diagnosis not present

## 2018-03-18 DIAGNOSIS — Z950 Presence of cardiac pacemaker: Secondary | ICD-10-CM | POA: Diagnosis not present

## 2018-04-13 DIAGNOSIS — G301 Alzheimer's disease with late onset: Secondary | ICD-10-CM | POA: Diagnosis not present

## 2018-04-13 DIAGNOSIS — F0280 Dementia in other diseases classified elsewhere without behavioral disturbance: Secondary | ICD-10-CM | POA: Diagnosis not present

## 2018-04-28 DIAGNOSIS — Z85828 Personal history of other malignant neoplasm of skin: Secondary | ICD-10-CM | POA: Diagnosis not present

## 2018-04-28 DIAGNOSIS — L570 Actinic keratosis: Secondary | ICD-10-CM | POA: Diagnosis not present

## 2018-05-19 DIAGNOSIS — E1129 Type 2 diabetes mellitus with other diabetic kidney complication: Secondary | ICD-10-CM | POA: Diagnosis not present

## 2018-05-19 DIAGNOSIS — I071 Rheumatic tricuspid insufficiency: Secondary | ICD-10-CM | POA: Insufficient documentation

## 2018-05-19 DIAGNOSIS — I10 Essential (primary) hypertension: Secondary | ICD-10-CM | POA: Diagnosis not present

## 2018-05-19 DIAGNOSIS — R809 Proteinuria, unspecified: Secondary | ICD-10-CM | POA: Diagnosis not present

## 2018-08-02 DIAGNOSIS — J208 Acute bronchitis due to other specified organisms: Secondary | ICD-10-CM | POA: Diagnosis not present

## 2018-08-03 DIAGNOSIS — E782 Mixed hyperlipidemia: Secondary | ICD-10-CM | POA: Diagnosis not present

## 2018-08-03 DIAGNOSIS — I442 Atrioventricular block, complete: Secondary | ICD-10-CM | POA: Diagnosis not present

## 2018-08-05 DIAGNOSIS — E119 Type 2 diabetes mellitus without complications: Secondary | ICD-10-CM | POA: Insufficient documentation

## 2018-08-05 DIAGNOSIS — Z961 Presence of intraocular lens: Secondary | ICD-10-CM | POA: Diagnosis not present

## 2018-08-05 DIAGNOSIS — H43393 Other vitreous opacities, bilateral: Secondary | ICD-10-CM | POA: Diagnosis not present

## 2018-08-05 DIAGNOSIS — H524 Presbyopia: Secondary | ICD-10-CM | POA: Diagnosis not present

## 2018-08-05 DIAGNOSIS — H11152 Pinguecula, left eye: Secondary | ICD-10-CM | POA: Diagnosis not present

## 2018-08-05 DIAGNOSIS — Z7984 Long term (current) use of oral hypoglycemic drugs: Secondary | ICD-10-CM | POA: Diagnosis not present

## 2018-08-05 DIAGNOSIS — H5203 Hypermetropia, bilateral: Secondary | ICD-10-CM | POA: Diagnosis not present

## 2018-08-05 DIAGNOSIS — H02831 Dermatochalasis of right upper eyelid: Secondary | ICD-10-CM | POA: Diagnosis not present

## 2018-08-05 DIAGNOSIS — H16223 Keratoconjunctivitis sicca, not specified as Sjogren's, bilateral: Secondary | ICD-10-CM | POA: Diagnosis not present

## 2018-08-05 DIAGNOSIS — H35371 Puckering of macula, right eye: Secondary | ICD-10-CM | POA: Diagnosis not present

## 2018-08-05 DIAGNOSIS — H52203 Unspecified astigmatism, bilateral: Secondary | ICD-10-CM | POA: Diagnosis not present

## 2018-08-05 DIAGNOSIS — H04123 Dry eye syndrome of bilateral lacrimal glands: Secondary | ICD-10-CM | POA: Diagnosis not present

## 2018-08-05 DIAGNOSIS — H02834 Dermatochalasis of left upper eyelid: Secondary | ICD-10-CM | POA: Diagnosis not present

## 2018-08-09 DIAGNOSIS — R05 Cough: Secondary | ICD-10-CM | POA: Diagnosis not present

## 2018-08-23 DIAGNOSIS — J22 Unspecified acute lower respiratory infection: Secondary | ICD-10-CM | POA: Diagnosis not present

## 2018-08-23 DIAGNOSIS — R05 Cough: Secondary | ICD-10-CM | POA: Diagnosis not present

## 2018-09-03 DIAGNOSIS — E1169 Type 2 diabetes mellitus with other specified complication: Secondary | ICD-10-CM | POA: Diagnosis not present

## 2018-09-03 DIAGNOSIS — E781 Pure hyperglyceridemia: Secondary | ICD-10-CM | POA: Diagnosis not present

## 2018-09-03 DIAGNOSIS — Z0000 Encounter for general adult medical examination without abnormal findings: Secondary | ICD-10-CM | POA: Diagnosis not present

## 2018-09-03 DIAGNOSIS — Z23 Encounter for immunization: Secondary | ICD-10-CM | POA: Diagnosis not present

## 2018-09-03 DIAGNOSIS — R809 Proteinuria, unspecified: Secondary | ICD-10-CM | POA: Diagnosis not present

## 2018-09-03 DIAGNOSIS — R0989 Other specified symptoms and signs involving the circulatory and respiratory systems: Secondary | ICD-10-CM | POA: Diagnosis not present

## 2018-09-03 DIAGNOSIS — I10 Essential (primary) hypertension: Secondary | ICD-10-CM | POA: Diagnosis not present

## 2018-09-20 DIAGNOSIS — D649 Anemia, unspecified: Secondary | ICD-10-CM | POA: Diagnosis not present

## 2018-09-28 DIAGNOSIS — R0989 Other specified symptoms and signs involving the circulatory and respiratory systems: Secondary | ICD-10-CM | POA: Diagnosis not present

## 2018-10-04 DIAGNOSIS — G301 Alzheimer's disease with late onset: Secondary | ICD-10-CM | POA: Diagnosis not present

## 2018-10-04 DIAGNOSIS — F0280 Dementia in other diseases classified elsewhere without behavioral disturbance: Secondary | ICD-10-CM | POA: Diagnosis not present

## 2018-10-28 ENCOUNTER — Emergency Department (HOSPITAL_BASED_OUTPATIENT_CLINIC_OR_DEPARTMENT_OTHER): Payer: PPO

## 2018-10-28 ENCOUNTER — Encounter (HOSPITAL_BASED_OUTPATIENT_CLINIC_OR_DEPARTMENT_OTHER): Payer: Self-pay | Admitting: *Deleted

## 2018-10-28 ENCOUNTER — Other Ambulatory Visit: Payer: Self-pay

## 2018-10-28 ENCOUNTER — Emergency Department (HOSPITAL_BASED_OUTPATIENT_CLINIC_OR_DEPARTMENT_OTHER)
Admission: EM | Admit: 2018-10-28 | Discharge: 2018-10-28 | Disposition: A | Discharge status: Home / Self Care | Payer: PPO | Attending: Emergency Medicine | Admitting: Emergency Medicine

## 2018-10-28 DIAGNOSIS — R079 Chest pain, unspecified: Secondary | ICD-10-CM | POA: Diagnosis not present

## 2018-10-28 DIAGNOSIS — Z79899 Other long term (current) drug therapy: Secondary | ICD-10-CM | POA: Insufficient documentation

## 2018-10-28 DIAGNOSIS — Z7902 Long term (current) use of antithrombotics/antiplatelets: Secondary | ICD-10-CM | POA: Insufficient documentation

## 2018-10-28 DIAGNOSIS — I10 Essential (primary) hypertension: Secondary | ICD-10-CM | POA: Diagnosis not present

## 2018-10-28 DIAGNOSIS — Z7984 Long term (current) use of oral hypoglycemic drugs: Secondary | ICD-10-CM | POA: Diagnosis not present

## 2018-10-28 DIAGNOSIS — E119 Type 2 diabetes mellitus without complications: Secondary | ICD-10-CM | POA: Insufficient documentation

## 2018-10-28 DIAGNOSIS — Z87891 Personal history of nicotine dependence: Secondary | ICD-10-CM | POA: Diagnosis not present

## 2018-10-28 LAB — BASIC METABOLIC PANEL
Anion gap: 7 (ref 5–15)
BUN: 25 mg/dL — ABNORMAL HIGH (ref 8–23)
CO2: 27 mmol/L (ref 22–32)
Calcium: 10.4 mg/dL — ABNORMAL HIGH (ref 8.9–10.3)
Chloride: 104 mmol/L (ref 98–111)
Creatinine, Ser: 0.99 mg/dL (ref 0.61–1.24)
GFR calc Af Amer: >60 mL/min (ref >60)
GFR calc non Af Amer: >60 mL/min (ref >60)
Glucose, Bld: 198 mg/dL — ABNORMAL HIGH (ref 70–99)
Potassium: 3.7 mmol/L (ref 3.5–5.1)
Sodium: 138 mmol/L (ref 135–145)

## 2018-10-28 LAB — CBC
HCT: 37 % — ABNORMAL LOW (ref 39.0–52.0)
Hemoglobin: 11.3 g/dL — ABNORMAL LOW (ref 13.0–17.0)
MCH: 26.3 pg (ref 26.0–34.0)
MCHC: 30.5 g/dL (ref 30.0–36.0)
MCV: 86 fL (ref 80.0–100.0)
Platelets: 269 10*3/uL (ref 150–400)
RBC: 4.3 MIL/uL (ref 4.22–5.81)
RDW: 14.1 % (ref 11.5–15.5)
WBC: 6.7 10*3/uL (ref 4.0–10.5)
nRBC: 0 % (ref 0.0–0.2)

## 2018-10-28 LAB — TROPONIN I: Troponin I: <0.03 ng/mL (ref <0.03)

## 2018-10-28 NOTE — ED Provider Notes (Signed)
[facility] EMERGENCY DEPARTMENT Provider Note   CSN: [id number] Arrival date & time: 10/28/18  1525     History   Chief Complaint Chief Complaint  Patient presents with  . Chest Pain    HPI Kenneth Lyons is a 78 y.o. male.  Patient presents for the evaluation of left sided anterior chest discomfort, onset yesterday. Discomfort continued through early this afternoon, currently resolved. Discomfort described as sharp. Not associated with shortness of breath, nausea, diaphoresis, syncope. Not increased with deep breathing or palpation. No rash. 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  The history is provided by the patient. No language interpreter was used.  Chest Pain  Pain location:  L chest Pain quality: sharp   Pain radiates to:  Does not radiate Pain severity:  Moderate Onset quality:  Sudden Duration:  2 days Timing:  Intermittent Progression:  Waxing and waning (currently resolved) Chronicity:  New Context: at rest   Context: not breathing, not movement and not trauma   Relieved by:  None tried Worsened by:  Nothing Associated symptoms: no diaphoresis, no dizziness, no fatigue, no lower extremity edema, no nausea, no palpitations, no shortness of breath and no syncope   Risk factors: diabetes mellitus, high cholesterol and hypertension     Past Medical History:  Diagnosis Date  . Cancer ([place])   . Diabetes mellitus   . GERD (gastroesophageal reflux disease)   . High cholesterol   . Hypertension   . Stroke [facility])     There are no active problems to display for this patient.   Past Surgical History:  Procedure Laterality Date  . APPENDECTOMY    . PACEMAKER INSERTION    . PROSTATE SURGERY    . TONSILLECTOMY          Home Medications    Prior to Admission medications   Medication Sig Start Date End Date Taking? Authorizing Provider  amLODipine (NORVASC) 10 MG tablet TAKE 1 TABLET BY MOUTH ONCE  DAILY 01/28/18  Yes [provider]  Blood Glucose Monitoring Suppl (GLUCOCOM BLOOD GLUCOSE MONITOR) DEVI Check blood sugar once daily.   Dx code: E11.9 11/06/15  Yes [provider]  Blood Glucose Monitoring Suppl (GLUCOCOM BLOOD GLUCOSE MONITOR) DEVI Check blood sugar once daily.   Dx code: E11.9 11/06/15  Yes [provider]  carvedilol (COREG) 25 MG tablet TAKE 1 TABLET BY MOUTH TWICE DAILY WITH MEALS 01/28/18  Yes [provider]  clopidogrel (PLAVIX) 75 MG tablet TAKE 1 TABLET BY MOUTH ONCE DAILY 03/20/17  Yes [provider]  escitalopram (LEXAPRO) 10 MG tablet Take by mouth. 10/04/18 01/02/19 Yes [provider]  fenofibrate 160 MG tablet TAKE 1 TABLET BY MOUTH ONCE DAILY 10/04/18  Yes [provider]  galantamine (RAZADYNE ER) 16 MG 24 hr capsule Take by mouth. 05/12/17  Yes [provider]  glipiZIDE (GLUCOTROL) 10 MG tablet TAKE 1 TABLET BY MOUTH TWICE DAILY WITH MEALS 03/05/18  Yes [provider]  hydrochlorothiazide (HYDRODIURIL) 25 MG tablet TAKE 1 TABLET BY MOUTH ONCE DAILY 01/27/17  Yes [provider]  lisinopril (PRINIVIL,ZESTRIL) 40 MG tablet Take by mouth. 01/28/18  Yes [provider]  memantine (NAMENDA) 10 MG tablet TAKE 1 TABLET BY MOUTH TWICE DAILY 05/12/17  Yes [provider]  metFORMIN (GLUCOPHAGE) 1000 MG tablet TAKE 1 TABLET BY MOUTH TWICE DAILY WITH MEALS 01/28/18  Yes [provider]  simvastatin (ZOCOR) 40 MG tablet TAKE 1  TABLET BY MOUTH ONCE DAILY 01/28/18  Yes [provider]  tamsulosin (FLOMAX) 0.4 MG CAPS capsule TAKE 1 CAPSULE BY MOUTH ONCE DAILY 02/09/17  Yes [provider]  amLODipine (NORVASC) 10 MG tablet Take 10 mg by mouth daily.    [provider]  aspirin EC 81 MG tablet Take by mouth.    [provider]  carvedilol (COREG) 12.5 MG tablet Take 12.5 mg by mouth 2 (two) times daily with a meal.    [provider]  fenofibrate 160 MG tablet Take 160 mg by mouth daily.    [provider]  glipiZIDE (GLUCOTROL) 10 MG tablet Take 10 mg by mouth 2 (two) times daily before a meal.    [provider]  lisinopril-hydrochlorothiazide (PRINZIDE,ZESTORETIC) 20-25 MG per tablet Take 1 tablet by mouth daily.    [provider]  meclizine (ANTIVERT) 25 MG tablet Take 1 tablet (25 mg total) by mouth 3 (three) times daily as needed for dizziness. 04/11/16   Deno Etienne, DO  meloxicam (MOBIC) 7.5 MG tablet Take 7.5 mg by mouth daily.    [provider]  metFORMIN (GLUMETZA) 1000 MG (MOD) 24 hr tablet Take 1,000 mg by mouth daily with breakfast.    [provider]  Misc Natural Products (OSTEO BI-FLEX JOINT SHIELD) TABS Take 2 tablets by mouth daily.    [provider]  Multiple Vitamin (MULTIVITAMIN) tablet Take 1 tablet by mouth daily.    [provider]  Multiple Vitamin (MULTIVITAMIN) tablet Take by mouth.    [provider]  Omega 3 1000 MG CAPS Take by mouth.    [provider]  Omega-3 Fatty Acids (FISH OIL PO) Take 2 capsules by mouth daily.    [provider]  selenium 50 MCG TABS Take 50 mcg by mouth daily.    [provider]  simvastatin (ZOCOR) 40 MG tablet Take 40 mg by mouth every evening.    [provider]  sitaGLIPtin (JANUVIA) 100 MG tablet Take by mouth.    [provider]  Tamsulosin HCl (FLOMAX) 0.4 MG CAPS Take 1 capsule (0.4 mg total) by mouth daily. 07/06/12   Dorie Rank, MD    Family History No family history on file.  Social History Social History   Tobacco Use  . Smoking status: Former Research scientist (life sciences)  . Smokeless tobacco: Never Used  Substance Use Topics  . Alcohol use: Yes    Comment: occ  . Drug use: No     Allergies   Patient has no known allergies.   Review of Systems Review of Systems  Constitutional: Negative for diaphoresis and fatigue.  Respiratory:  Negative for shortness of breath.   Cardiovascular: Positive for chest pain. Negative for palpitations and syncope.  Gastrointestinal: Negative for nausea.  Neurological: Negative for dizziness.  All other systems reviewed and are negative.    Physical Exam Updated Vital Signs BP (!) 182/58   Pulse 61   Temp 98.1 F (36.7 C) (Oral)   Resp 18   Ht 5\' 7"  (1.702 m)   Wt 86.2 kg   SpO2 99%   BMI 29.76 kg/m   Physical Exam Constitutional:      Appearance: He is well-developed. He is not ill-appearing.  HENT:     Head: Normocephalic.  Eyes:     Conjunctiva/sclera: Conjunctivae normal.  Cardiovascular:     Rate and Rhythm: Normal rate and regular rhythm.     Chest Wall: PMI is not displaced.  Pulses:          Carotid pulses are 2+ on the right side and 2+ on the left side.      Radial pulses are 2+ on the right side and 2+ on the left side.       Dorsalis pedis pulses are 2+ on the right side and 2+ on the left side.       Posterior tibial pulses are 2+ on the right side and 2+ on the left side.     Heart sounds: Normal heart sounds.  Pulmonary:     Effort: Pulmonary effort is normal.     Breath sounds: Normal breath sounds.  Abdominal:     General: Bowel sounds are normal.     Palpations: Abdomen is soft.  Musculoskeletal: Normal range of motion.     Right lower leg: No edema.     Left lower leg: No edema.  Skin:    General: Skin is warm and dry.  Neurological:     Mental Status: He is alert and oriented to person, place, and time.  Psychiatric:        Mood and Affect: Mood normal.      ED Treatments / Results  Labs (all labs ordered are listed, but only abnormal results are displayed) Labs Reviewed  BASIC METABOLIC PANEL - Abnormal; Notable for the following components:      Result Value   Glucose, Bld 198 (*)    BUN 25 (*)    Calcium 10.4 (*)    All other components within normal limits  CBC - Abnormal; Notable for the following components:    Hemoglobin 11.3 (*)    HCT 37.0 (*)    All other components within normal limits  TROPONIN I   Stable labs EKG EKG Interpretation  Date/Time:  Thursday October 28 2018 15:32:46 EST Ventricular Rate:  60 PR Interval:  178 QRS Duration: 196 QT Interval:  498 QTC Calculation: 498 R Axis:   -61 Text Interpretation:  AV dual-paced rhythm Abnormal ECG Confirmed by Davonna Belling [id number]) on 10/28/2018 5:11:49 PM    Dual A-V paced rhythm  Pacemaker interrogated:  No arrythmias or alert events.  Radiology No results found.  Procedures Procedures (including critical care time)  Medications Ordered in ED Medications - No data to display   Initial Impression / Assessment and Plan / ED Course  I have reviewed the triage vital signs and the nursing notes.  Pertinent labs & imaging results that were available during my care of the patient were reviewed by me and considered in my medical decision making (see chart for details).     Patient is to be discharged with recommendation to follow up with PCP in regards to today's hospital visit. Chest pain is not likely of cardiac or pulmonary etiology d/t presentation, perc negative, VSS, no tracheal deviation, no JVD or new murmur, RRR, breath sounds equal bilaterally, EKG without acute abnormalities, negative troponin, and negative CXR. Pt has been advised to return to the ED is CP becomes exertional, associated with diaphoresis or nausea, radiates to left jaw/arm, worsens or becomes concerning in any way. Pt appears reliable for follow up and is agreeable to discharge.   Case has been discussed with Dr. Alvino Chapel who agrees with the above plan to discharge.   Final Clinical Impressions(s) / ED Diagnoses   Final diagnoses:  Nonspecific chest pain    ED Discharge Orders    None       Etta Quill,  NP 10/28/18 1726    Davonna Belling, MD 10/29/18 [phone]

## 2018-10-28 NOTE — ED Notes (Signed)
NAD at this time. Pt is stable and went home.

## 2018-10-28 NOTE — ED Triage Notes (Signed)
Sharp left sided chest pain since yesterday. Same pain in the past years ago when he had a negative stress test. Hx of pacemaker in 2013.

## 2018-11-01 DIAGNOSIS — Z85828 Personal history of other malignant neoplasm of skin: Secondary | ICD-10-CM | POA: Diagnosis not present

## 2018-11-01 DIAGNOSIS — L570 Actinic keratosis: Secondary | ICD-10-CM | POA: Diagnosis not present

## 2018-11-01 DIAGNOSIS — Z08 Encounter for follow-up examination after completed treatment for malignant neoplasm: Secondary | ICD-10-CM | POA: Diagnosis not present

## 2018-11-24 DIAGNOSIS — I10 Essential (primary) hypertension: Secondary | ICD-10-CM | POA: Diagnosis not present

## 2018-11-24 DIAGNOSIS — I442 Atrioventricular block, complete: Secondary | ICD-10-CM | POA: Diagnosis not present

## 2018-11-26 DIAGNOSIS — D122 Benign neoplasm of ascending colon: Secondary | ICD-10-CM | POA: Diagnosis not present

## 2018-11-26 DIAGNOSIS — K2270 Barrett's esophagus without dysplasia: Secondary | ICD-10-CM | POA: Diagnosis not present

## 2018-11-26 DIAGNOSIS — K648 Other hemorrhoids: Secondary | ICD-10-CM | POA: Diagnosis not present

## 2018-11-26 DIAGNOSIS — D124 Benign neoplasm of descending colon: Secondary | ICD-10-CM | POA: Diagnosis not present

## 2018-11-26 DIAGNOSIS — K621 Rectal polyp: Secondary | ICD-10-CM | POA: Diagnosis not present

## 2018-11-26 DIAGNOSIS — D500 Iron deficiency anemia secondary to blood loss (chronic): Secondary | ICD-10-CM | POA: Diagnosis not present

## 2018-11-26 DIAGNOSIS — K635 Polyp of colon: Secondary | ICD-10-CM | POA: Diagnosis not present

## 2018-11-26 DIAGNOSIS — K317 Polyp of stomach and duodenum: Secondary | ICD-10-CM | POA: Diagnosis not present

## 2018-11-26 DIAGNOSIS — D126 Benign neoplasm of colon, unspecified: Secondary | ICD-10-CM | POA: Diagnosis not present

## 2018-11-26 DIAGNOSIS — K208 Other esophagitis: Secondary | ICD-10-CM | POA: Diagnosis not present

## 2018-12-17 DIAGNOSIS — Z950 Presence of cardiac pacemaker: Secondary | ICD-10-CM | POA: Diagnosis not present

## 2019-01-03 DIAGNOSIS — G301 Alzheimer's disease with late onset: Secondary | ICD-10-CM | POA: Insufficient documentation

## 2019-01-03 DIAGNOSIS — F0281 Dementia in other diseases classified elsewhere with behavioral disturbance: Secondary | ICD-10-CM | POA: Insufficient documentation

## 2019-01-03 DIAGNOSIS — F0280 Dementia in other diseases classified elsewhere without behavioral disturbance: Secondary | ICD-10-CM | POA: Insufficient documentation

## 2019-01-04 DIAGNOSIS — R809 Proteinuria, unspecified: Secondary | ICD-10-CM | POA: Diagnosis not present

## 2019-01-04 DIAGNOSIS — D649 Anemia, unspecified: Secondary | ICD-10-CM | POA: Diagnosis not present

## 2019-01-04 DIAGNOSIS — E1129 Type 2 diabetes mellitus with other diabetic kidney complication: Secondary | ICD-10-CM | POA: Diagnosis not present

## 2019-01-04 DIAGNOSIS — I10 Essential (primary) hypertension: Secondary | ICD-10-CM | POA: Diagnosis not present

## 2019-01-04 DIAGNOSIS — F0280 Dementia in other diseases classified elsewhere without behavioral disturbance: Secondary | ICD-10-CM | POA: Diagnosis not present

## 2019-01-04 DIAGNOSIS — G309 Alzheimer's disease, unspecified: Secondary | ICD-10-CM | POA: Diagnosis not present

## 2019-01-20 DIAGNOSIS — R42 Dizziness and giddiness: Secondary | ICD-10-CM | POA: Diagnosis not present

## 2019-01-20 DIAGNOSIS — M5416 Radiculopathy, lumbar region: Secondary | ICD-10-CM | POA: Diagnosis not present

## 2019-01-20 DIAGNOSIS — N184 Chronic kidney disease, stage 4 (severe): Secondary | ICD-10-CM | POA: Diagnosis not present

## 2019-01-20 DIAGNOSIS — M79652 Pain in left thigh: Secondary | ICD-10-CM | POA: Diagnosis not present

## 2019-01-20 DIAGNOSIS — F332 Major depressive disorder, recurrent severe without psychotic features: Secondary | ICD-10-CM | POA: Diagnosis not present

## 2019-01-20 DIAGNOSIS — E7800 Pure hypercholesterolemia, unspecified: Secondary | ICD-10-CM | POA: Diagnosis not present

## 2019-01-20 DIAGNOSIS — M25562 Pain in left knee: Secondary | ICD-10-CM | POA: Diagnosis not present

## 2019-01-20 DIAGNOSIS — I714 Abdominal aortic aneurysm, without rupture: Secondary | ICD-10-CM | POA: Diagnosis not present

## 2019-01-20 DIAGNOSIS — I10 Essential (primary) hypertension: Secondary | ICD-10-CM | POA: Diagnosis not present

## 2019-01-20 DIAGNOSIS — J449 Chronic obstructive pulmonary disease, unspecified: Secondary | ICD-10-CM | POA: Diagnosis not present

## 2019-01-20 DIAGNOSIS — R0600 Dyspnea, unspecified: Secondary | ICD-10-CM | POA: Diagnosis not present

## 2019-01-20 DIAGNOSIS — Z794 Long term (current) use of insulin: Secondary | ICD-10-CM | POA: Diagnosis not present

## 2019-01-20 DIAGNOSIS — G8929 Other chronic pain: Secondary | ICD-10-CM | POA: Diagnosis not present

## 2019-01-20 DIAGNOSIS — E1169 Type 2 diabetes mellitus with other specified complication: Secondary | ICD-10-CM | POA: Diagnosis not present

## 2019-01-20 DIAGNOSIS — M1712 Unilateral primary osteoarthritis, left knee: Secondary | ICD-10-CM | POA: Diagnosis not present

## 2019-03-18 DIAGNOSIS — Z950 Presence of cardiac pacemaker: Secondary | ICD-10-CM | POA: Diagnosis not present

## 2019-04-19 DIAGNOSIS — D485 Neoplasm of uncertain behavior of skin: Secondary | ICD-10-CM | POA: Diagnosis not present

## 2019-04-19 DIAGNOSIS — L570 Actinic keratosis: Secondary | ICD-10-CM | POA: Diagnosis not present

## 2019-04-19 DIAGNOSIS — D0462 Carcinoma in situ of skin of left upper limb, including shoulder: Secondary | ICD-10-CM | POA: Diagnosis not present

## 2019-05-12 DIAGNOSIS — D649 Anemia, unspecified: Secondary | ICD-10-CM | POA: Diagnosis not present

## 2019-05-12 DIAGNOSIS — R8290 Unspecified abnormal findings in urine: Secondary | ICD-10-CM | POA: Diagnosis not present

## 2019-05-12 DIAGNOSIS — I10 Essential (primary) hypertension: Secondary | ICD-10-CM | POA: Diagnosis not present

## 2019-05-12 DIAGNOSIS — F0281 Dementia in other diseases classified elsewhere with behavioral disturbance: Secondary | ICD-10-CM | POA: Diagnosis not present

## 2019-05-12 DIAGNOSIS — E1129 Type 2 diabetes mellitus with other diabetic kidney complication: Secondary | ICD-10-CM | POA: Diagnosis not present

## 2019-05-12 DIAGNOSIS — E119 Type 2 diabetes mellitus without complications: Secondary | ICD-10-CM | POA: Diagnosis not present

## 2019-05-12 DIAGNOSIS — G301 Alzheimer's disease with late onset: Secondary | ICD-10-CM | POA: Diagnosis not present

## 2019-05-12 DIAGNOSIS — R809 Proteinuria, unspecified: Secondary | ICD-10-CM | POA: Diagnosis not present

## 2019-05-12 DIAGNOSIS — R4182 Altered mental status, unspecified: Secondary | ICD-10-CM | POA: Diagnosis not present

## 2019-05-16 DIAGNOSIS — D485 Neoplasm of uncertain behavior of skin: Secondary | ICD-10-CM | POA: Diagnosis not present

## 2019-05-16 DIAGNOSIS — Z85828 Personal history of other malignant neoplasm of skin: Secondary | ICD-10-CM | POA: Diagnosis not present

## 2019-05-16 DIAGNOSIS — C4442 Squamous cell carcinoma of skin of scalp and neck: Secondary | ICD-10-CM | POA: Diagnosis not present

## 2019-05-16 DIAGNOSIS — D0462 Carcinoma in situ of skin of left upper limb, including shoulder: Secondary | ICD-10-CM | POA: Diagnosis not present

## 2019-05-16 DIAGNOSIS — C44311 Basal cell carcinoma of skin of nose: Secondary | ICD-10-CM | POA: Diagnosis not present

## 2019-05-16 DIAGNOSIS — C4441 Basal cell carcinoma of skin of scalp and neck: Secondary | ICD-10-CM | POA: Diagnosis not present

## 2019-05-16 DIAGNOSIS — L570 Actinic keratosis: Secondary | ICD-10-CM | POA: Diagnosis not present

## 2019-05-16 DIAGNOSIS — Z08 Encounter for follow-up examination after completed treatment for malignant neoplasm: Secondary | ICD-10-CM | POA: Diagnosis not present

## 2019-05-24 DIAGNOSIS — N3000 Acute cystitis without hematuria: Secondary | ICD-10-CM | POA: Diagnosis not present

## 2019-05-24 DIAGNOSIS — N179 Acute kidney failure, unspecified: Secondary | ICD-10-CM | POA: Diagnosis not present

## 2019-06-14 DIAGNOSIS — Z45018 Encounter for adjustment and management of other part of cardiac pacemaker: Secondary | ICD-10-CM | POA: Diagnosis not present

## 2019-06-14 DIAGNOSIS — I442 Atrioventricular block, complete: Secondary | ICD-10-CM | POA: Diagnosis not present

## 2019-07-05 DIAGNOSIS — I442 Atrioventricular block, complete: Secondary | ICD-10-CM | POA: Diagnosis not present

## 2019-07-08 ENCOUNTER — Other Ambulatory Visit: Payer: Self-pay | Admitting: *Deleted

## 2019-07-08 ENCOUNTER — Encounter: Payer: Self-pay | Admitting: *Deleted

## 2019-07-08 DIAGNOSIS — E119 Type 2 diabetes mellitus without complications: Secondary | ICD-10-CM

## 2019-07-08 NOTE — Patient Outreach (Signed)
[facility]) Care Management Chronic Special Needs Program  07/08/2019  Name: Novak Stgermaine DOB: 05-29-1941  MRN: [id number]  Mr. Elmo Rio is enrolled in a chronic special needs plan for Diabetes. Chronic Care Management Coordinator telephoned client to review health risk assessment and to develop individualized care plan.  Introduced the chronic care management program, importance of client participation, and taking their care plan to all provider appointments and inpatient facilities.  Reviewed the transition of care process and possible referral to community care management.  Subjective: Mr. Tinoco called this RNCM because he is requesting assistance with his Januvia copay or a less expensive substitution. He says he is in the coverage gap and that his Januvia cost $329 for a 90 day supply. He reports he checks a fasting blood sugar daily and that the values are between 90-130. He says he does not exercise, follows a CHO controlled meal plan and takes his medications as directed. He says he does not use a pill box as it confused him in the past. He says he sees his primary care provider (PCP) and his specialists: cardiologist, neurologist, ophthalmologist on a regular basis. He says his told him his Hgb A1C target is 5.5% and that his usually runs in the low 7's. He says he lives with his wife in a two story home, his bedroom is on the first floor, and that he is independent with ADLs and IADLS and that he is a retired Engineer, maintenance (IT). He says they have 3 children, 2 in the [place], [place] area and one in [place] and several grandchildren.   Goals Addressed            This Visit's Progress   . Client understands the importance of follow-up with providers by attending scheduled visits   On track   . Client will not report change from baseline and no repeated symptoms of stroke with in the next 3 months       Mailed educational handout to client "What You Can Do To Prevent A  Second Stroke"    . Client will report no fall or injuries in the next 3 months.       Mailed client "Check Home For Safety" brochure    . Client will verbalize knowledge of self management of Hypertension as evidences by BP reading of 140/90 or less; or as defined by provider       Reviewed BP targets Mailed educational handout to client on diabetes and blood pressure    . Client/Caregiver will verbalize understanding of instructions related to self-care and safety   On track    Client reports no falls Mailed "Check for Safety" brochure to client and educational handouts "How to Help Your Memory" and "Mild Cognitive Impairment"     . HEMOGLOBIN A1C < 7.0       Reviewed A1C target and self management skills Diabetes self management actions:  Glucose monitoring per provider recommendations  Perform Quality checks on blood meter  Eat Healthy  Check feet daily  Visit provider every 3-6 months as directed  Hbg A1C level every 3-6 months.  Eye Exam yearly    . Maintain timely refills of diabetic medication as prescribed within the year .   On track    Referred client to pharmacy for coverage gap assistance for Januvia    . Obtain annual  Lipid Profile, LDL-C   On track    Assessed statin adherence Yearly lipid profiles obtained Lipid profile done Sep 03 2018  showed elevated triglycerides and low HDL but both improved from 2018 profile    . Obtain Annual Eye (retinal)  Exam    On track    Client has annual diabetic eye exam in fall of each year- negative for retinopathy    . Obtain Annual Foot Exam   On track    Client states provider checks feet at each exam, states no skin impairment    . Obtain annual screen for micro albuminuria (urine) , nephropathy (kidney problems)   Not on track    Client states urine checked for protein yearly Unable to find microabuminuria results in [place] since 02/21/2015    . Obtain Hemoglobin A1C at least 2 times per year   On track     Most recent Hgb A1C done 05/12/19 with result of 7.4%     . Visit Primary Care Provider or Endocrinologist at least 2 times per year    On track    Client states he sess his primary care provider every 4 months with most recent completed office visit on 05/12/19     Assessment: Client is not meeting diabetes self-management goal of hemoglobin A1C of <7% with most recent reading of 7.4% on 05/12/19 without reports of hypoglycemia . Client has good understanding of:  COVID-19 cause, symptoms, precautions (social distancing, stay at home order, hand washing, wear face covering when unable to maintain or ensure 6 foot social distancing), and symptoms requiring provider notification.  Plan:   Send successful outreach letter with a copy of their individualized care plan to client Send individual care plan to provider  Send educational material on home safety, and diabetes and HTN To client Chronic care management coordination will outreach in:  3 Months Will refer client to:  Pharmacy for coverage gap medication assistance for Januvia   Kelli Churn RN, CCM, CDE Chronic Care Management Coordinator [facility] Management [phone]

## 2019-07-11 ENCOUNTER — Telehealth: Payer: Self-pay | Admitting: Pharmacist

## 2019-07-11 NOTE — Patient Outreach (Addendum)
Kenneth Lyons) Care Management  [place]   07/11/2019  Kenneth Lyons [birth year] [id number]  Reason for referral: Medication Assistance  Referral source: [employer] Care Manager with [facility] Current insurance: [employer]  PMHx includes but not limited to:  Type 2 diabetes, history of prostate cancer, history of CVA, Alzheimer's disease, and hyperlipidemia.   Outreach:  Successful telephone call with patient and his wife Kenneth Lyons).  HIPAA identifiers verified.   Subjective:  Patient is a 78 year old male with the medical conditions listed above. He reported Januvia had a copay of >$350 for a 90 day supply the last time he went to pick it up. He wondered if there were any programs to help him with the copayment.  Objective: The ASCVD Risk score Kenneth Bussing DC Jr., et al., 2013) failed to calculate for the following reasons:   The patient has a prior MI or stroke diagnosis  Lab Results  Component Value Date   CREATININE 0.99 10/28/2018   CREATININE 1.00 07/06/2012    No results found for: HGBA1C 7.3% ([facility]) 05/12/19 Lipid Panel  No results found for: CHOL, TRIG, HDL, CHOLHDL, VLDL, LDLCALC, LDLDIRECT  BP Readings from Last 3 Encounters:  10/28/18 (!) 168/68  04/11/16 170/72  07/06/12 191/94    No Known Allergies  Medications Reviewed Today    Reviewed by Kenneth Lyons (Pharmacist) on 07/11/19 at [facility] List Status: <None>  Medication Order Taking? Sig Documenting Provider Last Dose Status Informant  amLODipine (NORVASC) 10 MG tablet [id number] Yes Take 10 mg by mouth daily. Provider, Historical, Lyons Taking Lyons Self  aspirin EC 81 MG tablet [id number] Yes Take by mouth. Provider, Historical, Lyons Taking Lyons   Blood Glucose Monitoring Suppl [facility] BLOOD GLUCOSE MONITOR) DEVI [id number] Yes Check blood sugar once daily.   Dx code: E11.9 Provider, Historical, Lyons Taking Lyons   Calcium-Magnesium-Vitamin D (CALCIUM  1200+D3 PO) [id number] Yes Take 1 tablet by mouth daily. Provider, Historical, Lyons Taking Lyons   carvedilol (COREG) 25 MG tablet [id number] Yes TAKE 1 TABLET BY MOUTH TWICE DAILY WITH MEALS Provider, Historical, Lyons Taking Lyons   Cholecalciferol (VITAMIN D-1000 MAX ST) 25 MCG (1000 UT) tablet [id number] Yes Take 1 capsule by mouth daily. Provider, Historical, Lyons Taking Lyons   clopidogrel (PLAVIX) 75 MG tablet [id number] Yes TAKE 1 TABLET BY MOUTH ONCE DAILY Provider, Historical, Lyons Taking Lyons   escitalopram (LEXAPRO) 10 MG tablet [id number]  Take by mouth. Provider, Historical, Lyons  Expired 01/02/19 2359   fenofibrate 160 MG tablet [id number] Yes TAKE 1 TABLET BY MOUTH ONCE DAILY Provider, Historical, Lyons Taking Lyons   ferrous sulfate 325 (65 FE) MG tablet [id number] Yes Take 1 tablet by mouth daily. Provider, Historical, Lyons Taking Lyons   galantamine (RAZADYNE ER) 16 MG 24 hr capsule [id number] Yes Take 16 mg by mouth daily.  Provider, Historical, Lyons Taking Lyons   glipiZIDE (GLUCOTROL) 10 MG tablet [id number] Yes Take 10 mg by mouth 2 (two) times daily before a meal. Provider, Historical, Lyons Taking Lyons Self  Glucosamine-Chondroitin 250-200 MG TABS [id number] Yes Take 1 tablet by mouth 2 (two) times daily. Provider, Historical, Lyons Taking Lyons   hydrochlorothiazide (HYDRODIURIL) 25 MG tablet [id number] Yes TAKE 1 TABLET BY MOUTH ONCE DAILY Provider, Historical, Lyons Taking Lyons   lisinopril (PRINIVIL,ZESTRIL) 40 MG tablet [id number] Yes Take by mouth. Provider, Historical, Lyons Taking Lyons   meclizine (ANTIVERT) 25 MG tablet [id number] Yes Take 1 tablet (25 mg  total) by mouth 3 (three) times daily as needed for dizziness. Kenneth Etienne, DO Taking Lyons   meloxicam (MOBIC) 7.5 MG tablet [id number] Yes Take 7.5 mg by mouth daily. Provider, Historical, Lyons Taking Lyons Self  memantine (NAMENDA) 10 MG tablet [id number] Yes TAKE 1 TABLET BY MOUTH TWICE DAILY Provider, Historical, Lyons Taking Lyons   metFORMIN  (GLUCOPHAGE) 1000 MG tablet [id number] Yes TAKE 1 TABLET BY MOUTH TWICE DAILY WITH MEALS Provider, Historical, Lyons Taking Lyons   Multiple Vitamin (MULTIVITAMIN) tablet [id number] Yes Take 1 tablet by mouth daily. Provider, Historical, Lyons Taking Lyons Self  Omega-3 Fatty Acids (FISH OIL PO) [id number] Yes Take 2 capsules by mouth daily. Provider, Historical, Lyons Taking Lyons Self  selenium 50 MCG TABS [id number] Yes Take 50 mcg by mouth daily. Provider, Historical, Lyons Taking Lyons Self        Discontinued 04/30/18 7588 (Duplicate)   simvastatin (ZOCOR) 40 MG tablet [id number] Yes TAKE 1 TABLET BY MOUTH ONCE DAILY Provider, Historical, Lyons Taking Lyons   sitaGLIPtin (JANUVIA) 100 MG tablet [id number] Yes Take 100 mg by mouth daily.  Provider, Historical, Lyons Taking Lyons            Med Note Kenneth Lyons, Kenneth Lyons   Fri Jul 08, 2019  2:22 PM) Says he ran out 2 days ago because he cannot afford the copay  Tamsulosin HCl (FLOMAX) 0.4 MG CAPS [id number] Yes Take 1 capsule (0.4 mg total) by mouth daily. Kenneth Lyons           Assessment: Drugs sorted by system:  Neurologic/Psychologic: Escitalopram, Galantamine, Memantine,   Cardiovascular:  Amlodipine, Aspirin, Carvedilol, Clopidogrel, Fenofibrate, HCTZ, Lisinopril, Simvastatin  Gastrointestinal: Meclizine,   Endocrine: Glipizide, Metformin, Januvia,   Pain: Meloxicam,   Genitourinary: Tamsulosin  Vitamins/Minerals/Supplements: Calcium/Mag/Vitamin D, cholecalciferol, Ferrous Sulfate, Multiple Vitamin, Omega 3 Fatty Acids, Selenium,  Glucosamine-Chondroitin,    Medication Review Findings:  Potential Dose too high:  Plasma concentrations and pharmacologic effects of simvastatin may be increased by amlodipine. The daily dose of simvastatin should not exceed 20 mg in patients receiving amlodipine according to official package labeling.  HgA1c- 7.3% 04/2019 Januvia, Metformin, Glipizide  On statin  LDL direct 51  12/24/2017)   Medication Assistance Findings:  Medication assistance needs identified: Januvia   Over Income to qualify for [employer]'s Patient Assistance Program  Extra Help:  Not eligible for Extra Help Low Income Subsidy based on reported income and assets  Patient Assistance Programs: Januvia made by [employer] o Income requirement met: No o Out-of-pocket prescription expenditure met:   Not Applicable - Patient has not met application requirements to apply for this program at this time.     Plan: . Will contact PCP  regarding Simvastatin dose with Amlodipine.  . Will route note to PCP.  Marland Kitchen Will follow-up in 3-4 weeks.   Kenneth Lyons, PharmD, [place] Clinical Pharmacist [phone]

## 2019-07-19 DIAGNOSIS — F0280 Dementia in other diseases classified elsewhere without behavioral disturbance: Secondary | ICD-10-CM | POA: Diagnosis not present

## 2019-07-19 DIAGNOSIS — Z7984 Long term (current) use of oral hypoglycemic drugs: Secondary | ICD-10-CM | POA: Diagnosis not present

## 2019-07-19 DIAGNOSIS — E119 Type 2 diabetes mellitus without complications: Secondary | ICD-10-CM | POA: Diagnosis not present

## 2019-07-19 DIAGNOSIS — E1129 Type 2 diabetes mellitus with other diabetic kidney complication: Secondary | ICD-10-CM | POA: Diagnosis not present

## 2019-07-19 DIAGNOSIS — Z8673 Personal history of transient ischemic attack (TIA), and cerebral infarction without residual deficits: Secondary | ICD-10-CM | POA: Diagnosis not present

## 2019-07-19 DIAGNOSIS — R809 Proteinuria, unspecified: Secondary | ICD-10-CM | POA: Diagnosis not present

## 2019-07-19 DIAGNOSIS — R197 Diarrhea, unspecified: Secondary | ICD-10-CM | POA: Diagnosis not present

## 2019-07-19 DIAGNOSIS — Z950 Presence of cardiac pacemaker: Secondary | ICD-10-CM | POA: Diagnosis not present

## 2019-07-19 DIAGNOSIS — G309 Alzheimer's disease, unspecified: Secondary | ICD-10-CM | POA: Diagnosis not present

## 2019-07-20 DIAGNOSIS — C44311 Basal cell carcinoma of skin of nose: Secondary | ICD-10-CM | POA: Diagnosis not present

## 2019-07-26 DIAGNOSIS — I10 Essential (primary) hypertension: Secondary | ICD-10-CM | POA: Diagnosis not present

## 2019-07-26 DIAGNOSIS — I442 Atrioventricular block, complete: Secondary | ICD-10-CM | POA: Diagnosis not present

## 2019-07-26 DIAGNOSIS — G309 Alzheimer's disease, unspecified: Secondary | ICD-10-CM | POA: Diagnosis not present

## 2019-07-26 DIAGNOSIS — I361 Nonrheumatic tricuspid (valve) insufficiency: Secondary | ICD-10-CM | POA: Diagnosis not present

## 2019-07-26 DIAGNOSIS — F0280 Dementia in other diseases classified elsewhere without behavioral disturbance: Secondary | ICD-10-CM | POA: Diagnosis not present

## 2019-07-26 DIAGNOSIS — R29818 Other symptoms and signs involving the nervous system: Secondary | ICD-10-CM | POA: Diagnosis not present

## 2019-07-26 DIAGNOSIS — I340 Nonrheumatic mitral (valve) insufficiency: Secondary | ICD-10-CM | POA: Diagnosis not present

## 2019-08-11 ENCOUNTER — Ambulatory Visit: Payer: Self-pay | Admitting: Pharmacist

## 2019-08-11 ENCOUNTER — Other Ambulatory Visit: Payer: Self-pay | Admitting: Pharmacist

## 2019-08-11 DIAGNOSIS — H524 Presbyopia: Secondary | ICD-10-CM | POA: Diagnosis not present

## 2019-08-11 DIAGNOSIS — H26492 Other secondary cataract, left eye: Secondary | ICD-10-CM | POA: Diagnosis not present

## 2019-08-11 DIAGNOSIS — H5203 Hypermetropia, bilateral: Secondary | ICD-10-CM | POA: Diagnosis not present

## 2019-08-11 DIAGNOSIS — H52203 Unspecified astigmatism, bilateral: Secondary | ICD-10-CM | POA: Diagnosis not present

## 2019-08-11 DIAGNOSIS — H35371 Puckering of macula, right eye: Secondary | ICD-10-CM | POA: Diagnosis not present

## 2019-08-11 DIAGNOSIS — E119 Type 2 diabetes mellitus without complications: Secondary | ICD-10-CM | POA: Diagnosis not present

## 2019-08-11 DIAGNOSIS — Z7984 Long term (current) use of oral hypoglycemic drugs: Secondary | ICD-10-CM | POA: Diagnosis not present

## 2019-08-11 NOTE — Patient Outreach (Addendum)
[facility]) Care Management  08/11/2019  Kenneth Lyons November 16, 1940 [id number]  Patient was called to follow up. Unfortunately, he did not answer his phone. HIPAA compliant message was left on his voicemail.    During our initial conversation, it was determined the patient is over income for patient assistance with Januvia.   Also, he is taking simvastatin 40 mg 1 tablet daily and is also on amlodipine. The simvastatin manufacturer dosing guidelines suggest the maximum dose for patients also taking amlodipine is 20 mg daily.  Patient has been on Simvastatin 40 mg and Amlodipine for some time without issue.  A note was routed to the patient's PCP after our last conversation.  Plan: Route another note to Dr. Suzy Bouchard Follow up with patient in 3 months for CSNP follow up.  Elayne Guerin, PharmD, [place] Clinical Pharmacist [phone]

## 2019-08-12 ENCOUNTER — Other Ambulatory Visit: Payer: Self-pay | Admitting: Pharmacist

## 2019-08-12 DIAGNOSIS — I10 Essential (primary) hypertension: Secondary | ICD-10-CM | POA: Diagnosis not present

## 2019-08-12 DIAGNOSIS — E1129 Type 2 diabetes mellitus with other diabetic kidney complication: Secondary | ICD-10-CM | POA: Diagnosis not present

## 2019-08-12 DIAGNOSIS — D649 Anemia, unspecified: Secondary | ICD-10-CM | POA: Diagnosis not present

## 2019-08-12 DIAGNOSIS — R809 Proteinuria, unspecified: Secondary | ICD-10-CM | POA: Diagnosis not present

## 2019-08-12 DIAGNOSIS — E781 Pure hyperglyceridemia: Secondary | ICD-10-CM | POA: Diagnosis not present

## 2019-08-12 NOTE — Patient Outreach (Signed)
[facility]) Care Management  08/12/2019  Kenneth Lyons 1940/10/31 [id number]   Patient called me back from my call yesterday. HIPAA identifiers were obtained. Patient confirmed his simvastatin dose had not been decreased. Patient is taking amlodipine and Simvastatin. The simvastatin package labeling suggests the maximum does of Simvastatin should be no more than 20 mg daily when combined with amlodipine.  Patient has been on both medications for some time without complaint.  Will continue with scheduled CSNP follow up. (He was over income for patient assistance with Januvia). Elayne Guerin, PharmD, [address] Clinical Pharmacist [phone]

## 2019-09-12 DIAGNOSIS — C44319 Basal cell carcinoma of skin of other parts of face: Secondary | ICD-10-CM | POA: Diagnosis not present

## 2019-09-12 DIAGNOSIS — D485 Neoplasm of uncertain behavior of skin: Secondary | ICD-10-CM | POA: Diagnosis not present

## 2019-09-12 DIAGNOSIS — Z85828 Personal history of other malignant neoplasm of skin: Secondary | ICD-10-CM | POA: Diagnosis not present

## 2019-09-12 DIAGNOSIS — Z08 Encounter for follow-up examination after completed treatment for malignant neoplasm: Secondary | ICD-10-CM | POA: Diagnosis not present

## 2019-09-12 DIAGNOSIS — L570 Actinic keratosis: Secondary | ICD-10-CM | POA: Diagnosis not present

## 2019-09-13 DIAGNOSIS — E1129 Type 2 diabetes mellitus with other diabetic kidney complication: Secondary | ICD-10-CM | POA: Diagnosis not present

## 2019-09-13 DIAGNOSIS — G301 Alzheimer's disease with late onset: Secondary | ICD-10-CM | POA: Diagnosis not present

## 2019-09-13 DIAGNOSIS — R2689 Other abnormalities of gait and mobility: Secondary | ICD-10-CM | POA: Diagnosis not present

## 2019-09-13 DIAGNOSIS — F0280 Dementia in other diseases classified elsewhere without behavioral disturbance: Secondary | ICD-10-CM | POA: Diagnosis not present

## 2019-09-28 DIAGNOSIS — Z7902 Long term (current) use of antithrombotics/antiplatelets: Secondary | ICD-10-CM | POA: Diagnosis not present

## 2019-09-28 DIAGNOSIS — I10 Essential (primary) hypertension: Secondary | ICD-10-CM | POA: Diagnosis not present

## 2019-09-28 DIAGNOSIS — F0280 Dementia in other diseases classified elsewhere without behavioral disturbance: Secondary | ICD-10-CM | POA: Diagnosis not present

## 2019-09-28 DIAGNOSIS — M5137 Other intervertebral disc degeneration, lumbosacral region: Secondary | ICD-10-CM | POA: Diagnosis not present

## 2019-09-28 DIAGNOSIS — Z8673 Personal history of transient ischemic attack (TIA), and cerebral infarction without residual deficits: Secondary | ICD-10-CM | POA: Diagnosis not present

## 2019-09-28 DIAGNOSIS — E119 Type 2 diabetes mellitus without complications: Secondary | ICD-10-CM | POA: Diagnosis not present

## 2019-09-28 DIAGNOSIS — G301 Alzheimer's disease with late onset: Secondary | ICD-10-CM | POA: Diagnosis not present

## 2019-09-28 DIAGNOSIS — Z9181 History of falling: Secondary | ICD-10-CM | POA: Diagnosis not present

## 2019-09-28 DIAGNOSIS — Z950 Presence of cardiac pacemaker: Secondary | ICD-10-CM | POA: Diagnosis not present

## 2019-09-28 DIAGNOSIS — Z7984 Long term (current) use of oral hypoglycemic drugs: Secondary | ICD-10-CM | POA: Diagnosis not present

## 2019-09-28 DIAGNOSIS — E785 Hyperlipidemia, unspecified: Secondary | ICD-10-CM | POA: Diagnosis not present

## 2019-09-28 DIAGNOSIS — Z7982 Long term (current) use of aspirin: Secondary | ICD-10-CM | POA: Diagnosis not present

## 2019-10-07 ENCOUNTER — Ambulatory Visit: Payer: Self-pay | Admitting: *Deleted

## 2019-10-10 ENCOUNTER — Ambulatory Visit: Payer: Self-pay | Admitting: *Deleted

## 2019-10-17 ENCOUNTER — Other Ambulatory Visit: Payer: Self-pay | Admitting: *Deleted

## 2019-10-17 ENCOUNTER — Ambulatory Visit: Payer: Self-pay | Admitting: *Deleted

## 2019-10-17 NOTE — Patient Outreach (Signed)
  [facility]) Care Management Chronic Special Needs Program    10/17/2019  Name: Kenneth Lyons, DOB: [birth year]  MRN: [id number]   [name] is enrolled in a chronic special needs plan for Diabetes. Attempted to reach Kenneth Lyons via mobile number to complete follow up  assessment; no answer; left HIPAA compliant message requesting return call.  Plan: If client does not return call, will make second outreach call within one week.  Kelli Churn RN, CCM, [facility] Care Management [phone]

## 2019-10-20 ENCOUNTER — Encounter: Payer: Self-pay | Admitting: *Deleted

## 2019-10-20 ENCOUNTER — Other Ambulatory Visit: Payer: Self-pay | Admitting: *Deleted

## 2019-10-20 DIAGNOSIS — I639 Cerebral infarction, unspecified: Secondary | ICD-10-CM | POA: Insufficient documentation

## 2019-10-20 DIAGNOSIS — E119 Type 2 diabetes mellitus without complications: Secondary | ICD-10-CM | POA: Insufficient documentation

## 2019-10-20 DIAGNOSIS — C801 Malignant (primary) neoplasm, unspecified: Secondary | ICD-10-CM | POA: Insufficient documentation

## 2019-10-20 NOTE — Patient Outreach (Signed)
[place] [facility]) Care Management Chronic Special Needs Program  10/20/2019  Name: Kenneth Lyons DOB: 10/31/1940  MRN: [id number]  Kenneth Lyons is enrolled in a chronic special needs plan for Diabetes. Reviewed and updated care plan.  Subjective: Spoke with client's wife initially as client was in the bathroom and Kenneth Lyons had questions about Trulicity. She is concerned that client's fasting blood sugars have been running higher, 180-210, since the glipizide and Januvia were stopped and client started Trulicity in mid December. She says they are vacationing in [place] for 3 weeks in January and she is worried Kenneth Lyons blood sugars will continue to climb.  Spoke with Kenneth Lyons and he says he only checks fasting blood sugars and that he injects Trulicity every Thursday. He denies difficulty with Trulicity self administration. He denies hypoglycemia. Kenneth Lyons says he just completed a few weeks of home health physical therapy that was ordered due to balance issues. He says no ambulatory assistive device was recommended and he was taught strengthening and balance exercises to do at home.  Client states both he and his wife have remained safe from the Covid virus and plan to take the vaccine when it is available.   Goals    . Client understands the importance of follow-up with providers by attending scheduled visits     Client states he consistently keeps provider appointments    . Client will not report change from baseline and no repeated symptoms of stroke with in the next 3 months     07/08/19 Mailed educational handout to client "What You Can Do To Prevent A Second Stroke" 10/20/19 Client reports no stroke symptoms and self monitored  blood pressure readings meeting targets    . Client will report no fall or injuries in the next 3 months.     07/08/19 Mailed client "Check Home For Safety" brochure 10/20/19 Client reports no falls    . Client will verbalize  knowledge of self management of Hypertension as evidences by BP reading of 140/90 or less; or as defined by provider     07/08/19 Reviewed BP targets and mailed educational handout to client on diabetes and blood pressure 10/20/19 Reviewed blood pressure targets and self monitored readings , reviewed blood pressure readings at recent provider office visits; readings meeting target the majority of the time    . Client/Caregiver will verbalize understanding of instructions related to self-care and safety     07/08/19 Client reports no falls, mailed "Check for Safety" brochure to client and educational handouts "How to Help Your Memory" and "Mild Cognitive Impairment" 10/20/19 Client reports no falls, to have driving evaluation related to late onset dementia, completed home physical therapy and no ambulatory device recommended, completes regular follow up with gerontologist/neurologist for dementia issues     . HEMOGLOBIN A1C < 7.0     10/20/19 Reviewed A1C target , most recent Hgb A1C of 7.0% on 08/12/19 and self management skill Diabetes self management actions:  Glucose monitoring per provider recommendations  Perform Quality checks on blood meter  Eat Healthy  Check feet daily  Visit provider every 3-6 months as directed  Hbg A1C level every 3-6 months.  Eye Exam yearly Reviewed self monitored CBGs and explained why fasting blood sugars have been running higher since stopping Glipizide and Januvia and starting Trulicity. Discussed mechanism  of action of the 3 medications, common side effects and the cardiovascular benefits of Trulicity. Advised them it may take longer to see the effect  of the Trulicity on client's fasting CBGs. Advised them to continue to monitor blood sugars and to report values to primary care provider prior to leaving for vacation. Also advised them to take testing supplies and left over Glipizide and /or Januvia with them in case provider wants to restart the  medications.     . Maintain timely refills of diabetic medication as prescribed within the year .     07/08/19 Referred client to pharmacy for coverage gap assistance for Januvia- client did not qualify for assistance due to income 10/20/19 Client states he refills medications on time and review of medication dispense report validates this    . Obtain Annual Foot Exam     Client states provider checks feet at each exam, states no skin impairment but reports thickened nails on left foot    . Obtain annual screen for micro albuminuria (urine) , nephropathy (kidney problems)     Client states urine checked for protein yearly Unable to find microabuminuria results in [place] since 02/21/2015       Assessment: Client is not meeting diabetes self-management goal of hemoglobin A1C of <7% with most recent reading of 7.0% on 08/12/19 without reports of hypoglycemia but with concern about fasting hyperglycemia since stopping glipizide . Client has good understanding of:  COVID-19 cause, symptoms, precautions (social distancing, stay at home order, hand washing, wear face covering when unable to maintain or ensure 6 foot social distancing), and symptoms requiring provider notification.  Plan:   Send successful outreach letter with a copy of their individualized care plan to client Send individual care plan to provider Chronic care management coordinator will outreach in:  6 Months  Kelli Churn RN, CCM, [place] Management Coordinator [place] Management [phone]   .

## 2019-10-28 DIAGNOSIS — E860 Dehydration: Secondary | ICD-10-CM | POA: Diagnosis not present

## 2019-10-28 DIAGNOSIS — R739 Hyperglycemia, unspecified: Secondary | ICD-10-CM | POA: Diagnosis not present

## 2019-10-28 DIAGNOSIS — R410 Disorientation, unspecified: Secondary | ICD-10-CM | POA: Diagnosis not present

## 2019-10-28 DIAGNOSIS — E1165 Type 2 diabetes mellitus with hyperglycemia: Secondary | ICD-10-CM | POA: Diagnosis not present

## 2019-10-28 DIAGNOSIS — F0390 Unspecified dementia without behavioral disturbance: Secondary | ICD-10-CM | POA: Diagnosis not present

## 2019-10-28 DIAGNOSIS — E119 Type 2 diabetes mellitus without complications: Secondary | ICD-10-CM | POA: Diagnosis not present

## 2019-10-28 DIAGNOSIS — R4182 Altered mental status, unspecified: Secondary | ICD-10-CM | POA: Diagnosis not present

## 2019-10-28 DIAGNOSIS — R81 Glycosuria: Secondary | ICD-10-CM | POA: Diagnosis not present

## 2019-10-28 DIAGNOSIS — I10 Essential (primary) hypertension: Secondary | ICD-10-CM | POA: Diagnosis not present

## 2019-10-28 DIAGNOSIS — R451 Restlessness and agitation: Secondary | ICD-10-CM | POA: Diagnosis not present

## 2019-10-30 ENCOUNTER — Other Ambulatory Visit: Payer: Self-pay

## 2019-10-30 ENCOUNTER — Emergency Department (HOSPITAL_BASED_OUTPATIENT_CLINIC_OR_DEPARTMENT_OTHER)
Admission: EM | Admit: 2019-10-30 | Discharge: 2019-10-30 | Disposition: A | Discharge status: Home / Self Care | Payer: HMO | Attending: Emergency Medicine | Admitting: Emergency Medicine

## 2019-10-30 ENCOUNTER — Encounter (HOSPITAL_BASED_OUTPATIENT_CLINIC_OR_DEPARTMENT_OTHER): Payer: Self-pay | Admitting: Emergency Medicine

## 2019-10-30 DIAGNOSIS — Z79899 Other long term (current) drug therapy: Secondary | ICD-10-CM | POA: Diagnosis not present

## 2019-10-30 DIAGNOSIS — E1165 Type 2 diabetes mellitus with hyperglycemia: Secondary | ICD-10-CM | POA: Insufficient documentation

## 2019-10-30 DIAGNOSIS — Z7984 Long term (current) use of oral hypoglycemic drugs: Secondary | ICD-10-CM | POA: Insufficient documentation

## 2019-10-30 DIAGNOSIS — Z7982 Long term (current) use of aspirin: Secondary | ICD-10-CM | POA: Diagnosis not present

## 2019-10-30 DIAGNOSIS — G301 Alzheimer's disease with late onset: Secondary | ICD-10-CM | POA: Diagnosis not present

## 2019-10-30 DIAGNOSIS — F0281 Dementia in other diseases classified elsewhere with behavioral disturbance: Secondary | ICD-10-CM | POA: Diagnosis not present

## 2019-10-30 DIAGNOSIS — R739 Hyperglycemia, unspecified: Secondary | ICD-10-CM

## 2019-10-30 DIAGNOSIS — Z87891 Personal history of nicotine dependence: Secondary | ICD-10-CM | POA: Insufficient documentation

## 2019-10-30 DIAGNOSIS — Z7902 Long term (current) use of antithrombotics/antiplatelets: Secondary | ICD-10-CM | POA: Diagnosis not present

## 2019-10-30 LAB — COMPREHENSIVE METABOLIC PANEL
ALT: 14 U/L (ref 0–44)
AST: 19 U/L (ref 15–41)
Albumin: 3.6 g/dL (ref 3.5–5.0)
Alkaline Phosphatase: 50 U/L (ref 38–126)
Anion gap: 8 (ref 5–15)
BUN: 27 mg/dL — ABNORMAL HIGH (ref 8–23)
CO2: 27 mmol/L (ref 22–32)
Calcium: 9.6 mg/dL (ref 8.9–10.3)
Chloride: 102 mmol/L (ref 98–111)
Creatinine, Ser: 1.16 mg/dL (ref 0.61–1.24)
GFR calc Af Amer: >60 mL/min (ref >60)
GFR calc non Af Amer: >60 mL/min (ref >60)
Glucose, Bld: 318 mg/dL — ABNORMAL HIGH (ref 70–99)
Potassium: 3.9 mmol/L (ref 3.5–5.1)
Sodium: 137 mmol/L (ref 135–145)
Total Bilirubin: 0.3 mg/dL (ref 0.3–1.2)
Total Protein: 7.2 g/dL (ref 6.5–8.1)

## 2019-10-30 LAB — URINALYSIS, MICROSCOPIC (REFLEX): Squamous Epithelial / HPF: NONE SEEN (ref 0–5)

## 2019-10-30 LAB — CBC WITH DIFFERENTIAL/PLATELET
Abs Immature Granulocytes: 0.02 10*3/uL (ref 0.00–0.07)
Basophils Absolute: 0 10*3/uL (ref 0.0–0.1)
Basophils Relative: 0 %
Eosinophils Absolute: 0.2 10*3/uL (ref 0.0–0.5)
Eosinophils Relative: 2 %
HCT: 30.3 % — ABNORMAL LOW (ref 39.0–52.0)
Hemoglobin: 9.5 g/dL — ABNORMAL LOW (ref 13.0–17.0)
Immature Granulocytes: 0 %
Lymphocytes Relative: 12 %
Lymphs Abs: 1 10*3/uL (ref 0.7–4.0)
MCH: 26.5 pg (ref 26.0–34.0)
MCHC: 31.4 g/dL (ref 30.0–36.0)
MCV: 84.4 fL (ref 80.0–100.0)
Monocytes Absolute: 0.7 10*3/uL (ref 0.1–1.0)
Monocytes Relative: 9 %
Neutro Abs: 6.2 10*3/uL (ref 1.7–7.7)
Neutrophils Relative %: 77 %
Platelets: 300 10*3/uL (ref 150–400)
RBC: 3.59 MIL/uL — ABNORMAL LOW (ref 4.22–5.81)
RDW: 13.9 % (ref 11.5–15.5)
WBC: 8.1 10*3/uL (ref 4.0–10.5)
nRBC: 0 % (ref 0.0–0.2)

## 2019-10-30 LAB — CBG MONITORING, ED
Glucose-Capillary: 329 mg/dL — ABNORMAL HIGH (ref 70–99)
Glucose-Capillary: 249 mg/dL — ABNORMAL HIGH (ref 70–99)

## 2019-10-30 LAB — URINALYSIS, ROUTINE W REFLEX MICROSCOPIC
Bilirubin Urine: NEGATIVE
Glucose, UA: >=500 mg/dL — AB
Hgb urine dipstick: NEGATIVE
Ketones, ur: NEGATIVE mg/dL
Leukocytes,Ua: NEGATIVE
Nitrite: NEGATIVE
Protein, ur: NEGATIVE mg/dL
Specific Gravity, Urine: 1.015 (ref 1.005–1.030)
pH: 6.5 (ref 5.0–8.0)

## 2019-10-30 MED ORDER — SODIUM CHLORIDE 0.9 % IV BOLUS
1000.0000 mL | Freq: Once | INTRAVENOUS | Status: AC
  Administered 2019-10-30: 1000 mL via INTRAVENOUS

## 2019-10-30 NOTE — ED Notes (Signed)
ED Provider at bedside. Dr. Regenia Skeeter

## 2019-10-30 NOTE — ED Provider Notes (Signed)
[place] EMERGENCY DEPARTMENT Provider Note   CSN: [id number] Arrival date & time: 10/30/19  1341     History Chief Complaint  Patient presents with  . Hyperglycemia    Kenneth Lyons is a 79 y.o. male with a hx of DM, hypertension, hypercholesterolemia, dementia, & prior stroke who presents to the ED with his wife for continued hyperglycemia today. Patient states that he is only here because his wife wanted him to come, he states he feels fine, his wife states that she is concerned that his blood sugars are continuing to be elevated and that he is not quite back to his normal self. She states that Friday evening 01/08 they were driving to [place] when patient started to seem confused, she took him to an ED in [place] who performed labs, CT head, & found him to be hyperglycemic improved with fluids/insulin and discharged him home. They returned home as opposed to going to [place]. She states his mental status is overall improving, he still does not know the day which is atypical for him but otherwise has been with it. She was concerned this AM when his blood sugar was high and wanted to get him evaluated. No alleviating/aggravating factors. CBG 363 PTA, had wheaties for breakfast prior to this. Patient was previously on Glipizide, Januvia & Metformin for diabetes control, his neurologist recommending discontinuation of Glipizide & Januvia due to possible contribution to worsening dementia, PCP discontinued these meds, started on Trulicity with his Metformin 4 weeks ago- sugars have been high since.  Denies fever, nausea, vomiting, polydipsia, abdominal pain, chest pain, dyspnea, numbness, weakness, dizziness, or headache. Patient states he does not feel confused.    HPI     Past Medical History:  Diagnosis Date  . Cancer ([place])   . Diabetes mellitus   . GERD (gastroesophageal reflux disease)   . High cholesterol   . Hypertension   . Stroke [facility])     Patient Active Problem  List   Diagnosis Date Noted  . Cancer ([place]) 10/20/2019  . Stroke ([place]) 10/20/2019  . Diabetes mellitus ([place]) 10/20/2019  . Late onset Alzheimer's disease with behavioral disturbance ([place]) 01/03/2019  . Type 2 diabetes mellitus without retinopathy ([place]) 08/05/2018  . Tricuspid regurgitation 05/19/2018  . Hyperplastic rectal polyp 02/22/2018  . Dermatochalasis of both upper eyelids 08/04/2017  . Epiretinal membrane (ERM) of right eye 08/04/2017  . Keratoconjunctivitis sicca of both eyes not specified as Sjogren's 08/04/2017  . Pinguecula, left eye 08/04/2017  . Presbyopia of both eyes 08/04/2017  . Vitreous floater, bilateral 08/04/2017  . Dementia ([place]) 06/03/2016  . Type 2 diabetes mellitus with microalbuminuria, without long-term current use of insulin ([place]) 04/16/2016  . Benign paroxysmal positional vertigo due to bilateral vestibular disorder 04/16/2016  . Pacemaker reprogramming/check 04/16/2016  . Essential hypertension 10/29/2015  . H/O malignant neoplasm of prostate 10/29/2015  . H/O: CVA (cerebrovascular accident) 10/29/2015  . Actinic keratosis 10/29/2015  . Atrioventricular block, complete ([place]) 10/29/2015  . Calculus of kidney 10/29/2015  . Carotid bruit 10/29/2015  . Cervical spondylosis without myelopathy 10/29/2015  . DDD (degenerative disc disease), lumbosacral 10/29/2015  . Disorder of refraction 10/29/2015  . Diverticulosis of colon 10/29/2015  . Fredrickson type IIb or III hyperlipoproteinemia 10/29/2015  . Hearing loss 10/29/2015  . History of benign neoplasm of colon 10/29/2015  . Hypertriglyceridemia 10/29/2015  . Normocytic anemia 10/29/2015  . Osteoarthritis, knee 10/29/2015  . Right knee pain 10/29/2015  . Pseudophakia of both eyes 10/29/2015  Past Surgical History:  Procedure Laterality Date  . APPENDECTOMY    . PACEMAKER INSERTION    . PROSTATE SURGERY    . TONSILLECTOMY         No family history on file.  Social History   Tobacco Use    . Smoking status: Former Research scientist (life sciences)  . Smokeless tobacco: Never Used  Substance Use Topics  . Alcohol use: Yes    Comment: occ  . Drug use: No    Home Medications Prior to Admission medications   Medication Sig Start Date End Date Taking? Authorizing Provider  amLODipine (NORVASC) 10 MG tablet Take 10 mg by mouth daily.    [provider]  aspirin EC 81 MG tablet Take by mouth.    [provider]  Blood Glucose Monitoring Suppl (GLUCOCOM BLOOD GLUCOSE MONITOR) DEVI Check blood sugar once daily.   Dx code: E11.9 11/06/15   [provider]  Calcium-Magnesium-Vitamin D (CALCIUM 1200+D3 PO) Take 1 tablet by mouth daily. 08/31/18   [provider]  carvedilol (COREG) 25 MG tablet TAKE 1 TABLET BY MOUTH TWICE DAILY WITH MEALS 01/28/18   [provider]  Cholecalciferol (VITAMIN D-1000 MAX ST) 25 MCG (1000 UT) tablet Take 1 capsule by mouth daily.    [provider]  clopidogrel (PLAVIX) 75 MG tablet TAKE 1 TABLET BY MOUTH ONCE DAILY 03/20/17   [provider]  escitalopram (LEXAPRO) 10 MG tablet Take by mouth. 10/04/18 01/02/19  [provider]  fenofibrate 160 MG tablet TAKE 1 TABLET BY MOUTH ONCE DAILY 10/04/18   [provider]  ferrous sulfate 325 (65 FE) MG tablet Take 1 tablet by mouth daily.    [provider]  galantamine (RAZADYNE ER) 16 MG 24 hr capsule Take 16 mg by mouth daily.  05/12/17   [provider]  glipiZIDE (GLUCOTROL) 10 MG tablet Take 10 mg by mouth 2 (two) times daily before a meal.    [provider]  Glucosamine-Chondroitin 250-200 MG TABS Take 1 tablet by mouth 2 (two) times daily.    [provider]  hydrochlorothiazide (HYDRODIURIL) 25 MG tablet TAKE 1 TABLET BY MOUTH ONCE DAILY 01/27/17   [provider]  lisinopril (PRINIVIL,ZESTRIL) 40 MG tablet Take by mouth. 01/28/18   [provider]  meclizine (ANTIVERT) 25 MG tablet Take 1 tablet (25 mg  total) by mouth 3 (three) times daily as needed for dizziness. 04/11/16   Deno Etienne, DO  meloxicam (MOBIC) 7.5 MG tablet Take 7.5 mg by mouth daily.    [provider]  memantine (NAMENDA) 10 MG tablet TAKE 1 TABLET BY MOUTH TWICE DAILY 05/12/17   [provider]  metFORMIN (GLUCOPHAGE) 1000 MG tablet TAKE 1 TABLET BY MOUTH TWICE DAILY WITH MEALS 01/28/18   [provider]  Multiple Vitamin (MULTIVITAMIN) tablet Take 1 tablet by mouth daily.    [provider]  Omega-3 Fatty Acids (FISH OIL PO) Take 2 capsules by mouth daily.    [provider]  rosuvastatin (CRESTOR) 20 MG tablet Take 20 mg by mouth daily. 08/17/19   [provider]  selenium 50 MCG TABS Take 50 mcg by mouth daily.    [provider]  simvastatin (ZOCOR) 40 MG tablet TAKE 1 TABLET BY MOUTH ONCE DAILY 01/28/18   [provider]  sitaGLIPtin (JANUVIA) 100 MG tablet Take 100 mg by mouth daily.     [provider]  Tamsulosin HCl (FLOMAX) 0.4 MG CAPS Take 1 capsule (0.4  mg total) by mouth daily. 07/06/12   Dorie Rank, MD  TRULICITY 4.65 KC/1.2XN SOPN clientt state he injects Trulicity every Thursday 10/04/19   [provider]    Allergies    Patient has no known allergies.  Review of Systems   Review of Systems  Constitutional: Negative for chills and fever.  Respiratory: Negative for shortness of breath.   Cardiovascular: Negative for chest pain.  Gastrointestinal: Negative for abdominal pain.  Endocrine: Negative for polydipsia, polyphagia and polyuria.  Neurological: Negative for dizziness, seizures, syncope, facial asymmetry, speech difficulty, weakness, numbness and headaches.  Psychiatric/Behavioral: Positive for confusion (per wife, patient denies).  All other systems reviewed and are negative.   Physical Exam Updated Vital Signs BP (!) 117/53 (BP Location: Right Arm)   Pulse 62   Temp 98.6 F (37 C) (Oral)   Resp 18   Ht 5'  7" (1.702 m)   Wt 86.2 kg   SpO2 98%   BMI 29.76 kg/m   Physical Exam Vitals and nursing note reviewed.  Constitutional:      General: He is not in acute distress.    Appearance: He is well-developed. He is not toxic-appearing.  HENT:     Head: Normocephalic and atraumatic.  Eyes:     General:        Right eye: No discharge.        Left eye: No discharge.     Conjunctiva/sclera: Conjunctivae normal.  Cardiovascular:     Rate and Rhythm: Normal rate and regular rhythm.  Pulmonary:     Effort: Pulmonary effort is normal. No respiratory distress.     Breath sounds: Normal breath sounds. No wheezing, rhonchi or rales.  Abdominal:     General: There is no distension.     Palpations: Abdomen is soft.     Tenderness: There is no abdominal tenderness.  Musculoskeletal:     Cervical back: Neck supple. No rigidity.  Skin:    General: Skin is warm and dry.     Findings: No rash.  Neurological:     Mental Status: He is alert.     Comments: Alert. Oriented x 4 with the exception that he stated today was Monday as opposed to Sunday but stated he wasnot sure. Clear speech. No facial droop. CNIII-XII grossly intact. Bilateral upper and lower extremities' sensation grossly intact. 5/5 symmetric strength with grip strength and with plantar and dorsi flexion bilaterally. Normal finger to nose bilaterally. Negative pronator drift. Negative Romberg sign. Gait is steady and intact.  Psychiatric:        Behavior: Behavior normal.     ED Results / Procedures / Treatments   Labs (all labs ordered are listed, but only abnormal results are displayed) Labs Reviewed  CBG MONITORING, ED - Abnormal; Notable for the following components:      Result Value   Glucose-Capillary 329 (*)    All other components within normal limits    EKG None  Radiology No results found.  Procedures Procedures (including critical care time)  Medications Ordered in ED Medications - No data to display  ED  Course  I have reviewed the triage vital signs and the nursing notes.  Pertinent labs & imaging results that were available during my care of the patient were reviewed by me and considered in my medical decision making (see chart for details).  Patient's wife has brought his paperwork from ED visit in [place] which has been reviewed:  CBC: mild anemia, no leukocytosis Metabolic  panel: Hyperglycemia w/o acidosis or elevation in anion gap.  Ethanol level: WNL Coags fairly unremarkable UA without UTI, glucosuria CT head: no intracranial hemorrhage, mass effect, or midline shift. Volume loss with slightly disproportionate enlargement of the ventricles as compared to the sulci. In the appropriate clinical setting, the possibility of normal pressure hydrocephalus cannot be excluded. Recommend clinical correlation & follow up with MRI as indicated.    MDM Rules/Calculators/A&P                     Patient presents to the ED with his wife for hyperglycemia and concern for confusion over the past few days. Patient is nontoxic appearing, resting comfortably, vitals without significant abnormality. Patient alert & oriented x 4 with the exception that he thought today was Monday as opposed to Sunday. He has no focal neuro deficits. No meningismus. Repeat labs today overall reassuring. Hyperglycemia without findings of DKA. Anemia similar to prior ED visit. No electrolyte derangement. No liver or renal dysfunction. No UTI. CT head was performed @ prior visit, given he is having improvement in his mental status do not feel repeat CT or emergent MRI in the ED is necessary, he also has a pacemaker so would not be amenable to this- outpatient neurology follow up. CBG improved with fluids- discussed option of medication adjustment and patient & his wife would prefer to call PCP to discuss this which I feel is appropriate, they have already sent message to PCP office for follow up. Patient appears appropriate for  discharge home. I discussed results, treatment plan, need for follow-up, and return precautions with the patient & his wife @ bedside. Provided opportunity for questions, the have confirmed understanding and are in agreement with plan.   This is a shared visit with supervising physician Dr. Regenia Skeeter who has independently evaluated patient & provided guidance in evaluation/management/disposition, in agreement with care    Final Clinical Impression(s) / ED Diagnoses Final diagnoses:  Hyperglycemia    Rx / DC Orders ED Discharge Orders    None       Amaryllis Dyke, PA-C 10/30/19 1648    Sherwood Gambler, MD 10/31/19 1003

## 2019-10-30 NOTE — ED Triage Notes (Signed)
Per wife pt is having hyperglycemia. This morning it was 322 and prior to arrival 363. Pt took his diabetes medication today. Pt was in the hospital on Friday in [place] for the same thing.

## 2019-10-30 NOTE — Discharge Instructions (Addendum)
You were seen in the ER today for elevated blood sugar levels- this improved to 249 with fluids. Your remaining work up was reassuring. Please call your primary care provider tomorrow as well as your neurologist to establish follow up within the next 3 days to recheck symptoms and blood sugar as well as for possible medication adjustments. Return to the ER for new or worsening symptoms including but not limited to altered mental status, increased confusion, not using a certain side of the body, vomiting, fever, abdominal pain, chest pain, trouble breathing, difficulty walking or any other concerns.

## 2019-10-31 ENCOUNTER — Other Ambulatory Visit: Payer: Self-pay | Admitting: *Deleted

## 2019-10-31 LAB — URINE CULTURE: Culture: <10000 — AB

## 2019-10-31 NOTE — Patient Outreach (Signed)
  [place] [facility]) Care Management Chronic Special Needs Program    10/31/2019  Name: Kenneth Lyons, DOB: [birth year]  MRN: [id number]   [name] is enrolled in a chronic special needs plan for Diabetes. Returned call to Mrs. Kenneth Lyons after she left a message on this RNCM's voice mail at 12:13 pm on Sunday 10/30/19.  Subjective: Mrs. Kenneth Lyons states client 's fasting blood sugars have remained high (>200) and that as they were driving to [place] she took him an emergency room in [place] on 1/8 because of increased confusion and incontinence combined with a blood sugar of 311 fasting and 369 post meal. She says this was even after she gave [name] 5 mg of Glipizide per his  provider's direction.  She took him to the emergency room in [facility] yesterday for ongoing hyperglycemia. Clarified with Mrs. Cothran that [name] is administering the Trulicity correctly. She also says they contacted [name] primary care provider and have an appointment on 11/01/19 at 1:45 pm. Mrs. Kenneth Lyons had questions about treating  hyperglycemia and hypoglycemia and diabetic meal planning. She says her daughter it currently staying with them and is going to help her with healthy meal planning as she is very knowledgeable in regards to this.   Plan: Positive reinforcement given to Mrs. Kenneth Lyons for seeking help for client. Reminded her to take all of [name] current medications to provider's appointment tomorrow. With Mrs. Kenneth Lyons's permission and after discussion of how to treat hypoglycemia and hyperglycemia, emailed Mrs. Kenneth Lyons 6 [employer] education modules on: Diabetic Meal Planning; Low Blood Sugar in People with Diabetes; Diabetes Diet;  Diabetes: Carbohydrate Counting;  How to Prevent High Blood Sugar Emergencies in Diabetes;  and Blood Sugar Emergencies for Type 2 diabetes. Encouraged Mrs. Kenneth Lyons to contact this RNCM for future care management questions or concerns and  requested update after [name] completes his provider appointment tomorrow afternoon.  Kelli Churn RN, CCM, [place] Management Coordinator [facility] Care Management [phone]

## 2019-11-01 DIAGNOSIS — E1165 Type 2 diabetes mellitus with hyperglycemia: Secondary | ICD-10-CM | POA: Diagnosis not present

## 2019-11-03 ENCOUNTER — Other Ambulatory Visit: Payer: Self-pay | Admitting: *Deleted

## 2019-11-03 NOTE — Patient Outreach (Signed)
  [place] [facility]) Care Management Chronic Special Needs Program    11/03/2019  Name: Kenneth Lyons, DOB: [birth year]  MRN: [id number]   [name] is enrolled in a chronic special needs plan for Diabetes. Received e-mail from client's wife, Kenneth Lyons, on 11/02/19 at 12:15 pm stating client saw a primary care provider on 11/01/19 to follow up on persistent hyperglycemia that resulted in 2 recent trips to the emergency room. Mrs. Moskal says client's diabetes treatment plan was changed to include: stopping Trulicity and resuming Januvia and Glipizide.  The e-mail included client's fasting blood sugar on 11/02/19 of 183 and Mrs. Honda stated that was the first time his fasting blood sugar has been under 200 in a month.   Plan: Sent reply e-mail to Mrs Kunesh thanking her for the update and counseled her that glipizide can cause low blood sugars so encourage client to eat meals at regular intervals and check blood sugar anytime he does not feel well or suspects hypoglycemia. Also  encouraged Mrs. Bauserman to contact this RNCM for future care management questions and/or concerns. Will update medication list and care plan to reflect changes to diabetes treatment plan.  Kelli Churn RN, CCM, [place] Management Coordinator [facility] Care Management [phone]

## 2019-11-09 ENCOUNTER — Ambulatory Visit: Payer: Self-pay | Admitting: Pharmacist

## 2019-11-09 ENCOUNTER — Other Ambulatory Visit: Payer: Self-pay | Admitting: Pharmacist

## 2019-11-09 NOTE — Patient Outreach (Signed)
[place] [facility]) Care Management  11/09/2019  Kenneth Lyons 1940-12-23 [id number]   Patient was called regarding CSNP follow up. Unfortunately, he did not answer the phone. HIPAA compliant message was left on both the home and mobile numbers.  Plan: Call patient back in 8-10 weeks.  Elayne Guerin, PharmD, [place] Clinical Pharmacist [phone]

## 2019-11-10 ENCOUNTER — Other Ambulatory Visit: Payer: Self-pay | Admitting: Pharmacist

## 2019-11-10 NOTE — Patient Outreach (Signed)
[name] Care Management  11/10/2019  Kenneth Lyons 1941/10/02 [id number]   Patient's wife called me back and left a message. I called back and spoke with the patient. HIPAA identifiers were obtained.    Patient is a 79 year old male with multiple medical conditions including but not limited to:  AV block, cervical spondylosis, DDD, dementia, type 2 diabetes, hypertension, CVA, hyperlipidemia, osteoarthritis of the knee, Pacemaker placement, and history of stroke.  Patient's medications were reviewed via telephone:  Medications Reviewed Today    Reviewed by Kenneth Lyons, [facility] (Pharmacist) on 11/10/19 at 1325  Med List Status: <None>  Medication Order Taking? Sig Documenting Provider Last Dose Status Informant  alfuzosin (UROXATRAL) 10 MG 24 hr tablet [id number] Yes TAKE 1 TABLET BY MOUTH ONCE DAILY [provider] Taking Active   amLODipine (NORVASC) 10 MG tablet [id number] Yes Take 10 mg by mouth daily. [provider] Taking Active Self  aspirin EC 81 MG tablet [id number] Yes Take by mouth. [provider] Taking Active   Blood Glucose Monitoring Suppl [facility] BLOOD GLUCOSE MONITOR) DEVI [id number] Yes Check blood sugar once daily.   Dx code: E11.9 [provider] Taking Active   Calcium-Magnesium-Vitamin D (CALCIUM 1200+D3 PO) [id number] Yes Take 1 tablet by mouth daily. [provider] Taking Active   carvedilol (COREG) 25 MG tablet [id number] Yes TAKE 1 TABLET BY MOUTH TWICE DAILY WITH MEALS [provider] Taking Active   Cholecalciferol (VITAMIN D-1000 MAX ST) 25 MCG (1000 UT) tablet [id number] Yes Take 1 capsule by mouth daily. [provider] Taking Active   clopidogrel (PLAVIX) 75 MG tablet [id number] Yes TAKE 1 TABLET BY MOUTH ONCE DAILY [provider] Taking Active   Cyanocobalamin 5000 MCG/ML LIQD [id number] Yes Place one dose under the tongue daily [provider] Taking Active    escitalopram (LEXAPRO) 10 MG tablet [id number]  Take by mouth. [provider]  Expired 01/02/19 2359   fenofibrate 160 MG tablet [id number] Yes TAKE 1 TABLET BY MOUTH ONCE DAILY [provider] Taking Active   ferrous sulfate 325 (65 FE) MG tablet [id number] Yes Take 1 tablet by mouth daily. [provider] Taking Active   glipiZIDE (GLUCOTROL) 10 MG tablet [id number] Yes Take 10 mg by mouth 2 (two) times daily before a meal. [provider] Taking Active Self           Med Note Kenneth Lyons, [place] Nov 03, 2019  4:49 PM) Restarted on 11/02/19 as blood sugars were not controlled on Trulicity with Metformin  Glucosamine-Chondroitin 250-200 MG TABS [id number] Yes Take 1 tablet by mouth 2 (two) times daily. [provider] Taking Active   hydrochlorothiazide (HYDRODIURIL) 25 MG tablet [id number] Yes TAKE 1 TABLET BY MOUTH ONCE DAILY [provider] Taking Active   lisinopril (PRINIVIL,ZESTRIL) 40 MG tablet [id number] Yes Take by mouth. [provider] Taking Active   meclizine (ANTIVERT) 25 MG tablet [id number]  Take 1 tablet (25 mg total) by mouth 3 (three) times daily as needed for dizziness. Kenneth Etienne, DO  Active   memantine (NAMENDA) 10 MG tablet [id number] Yes TAKE 1 TABLET BY MOUTH TWICE DAILY [provider] Taking Active   metFORMIN (GLUCOPHAGE) 1000 MG tablet [id number] Yes TAKE 1 TABLET BY MOUTH TWICE DAILY WITH MEALS [provider] Taking Active   Multiple Vitamin (MULTIVITAMIN) tablet [id number] Yes Take 1 tablet by mouth daily. [provider] Taking Active Self  Omega-3 Fatty Acids (FISH OIL  PO) [id number] Yes Take 2 capsules by mouth daily. [provider] Taking Active Self  rosuvastatin (CRESTOR) 20 MG tablet [id number] Yes Take 20 mg by mouth daily. [provider] Taking Active   selenium 50 MCG TABS [id number] Yes Take 50 mcg by mouth daily. [provider] Taking Active Self         Discontinued 11/10/19 1324 (Change in therapy)   sitaGLIPtin (JANUVIA) 100 MG tablet [id number] Yes Take 100 mg by mouth daily.  [provider] Taking Active            Med Note Kenneth Lyons, [place] Nov 03, 2019  4:51 PM) Restarted on 11/02/19 as blood sugars were not controlled on Trulicity  and Metformin         Medication Findings:  HgA1c-7% 08/12/2019 [facility]  Over income for [employer] Simvastatin was changed to Rosuvastatin (previous drug interaction with Amlodipine)  Plan: Follow up with patient in 6 months.  Kenneth Lyons, PharmD, [place] Clinical Pharmacist [phone]

## 2019-11-21 ENCOUNTER — Ambulatory Visit: Payer: HMO

## 2019-11-24 ENCOUNTER — Ambulatory Visit: Payer: HMO

## 2019-12-13 DIAGNOSIS — D649 Anemia, unspecified: Secondary | ICD-10-CM | POA: Diagnosis not present

## 2020-01-16 ENCOUNTER — Ambulatory Visit: Payer: Self-pay | Admitting: Pharmacist

## 2020-01-18 DIAGNOSIS — L570 Actinic keratosis: Secondary | ICD-10-CM | POA: Diagnosis not present

## 2020-01-18 DIAGNOSIS — L820 Inflamed seborrheic keratosis: Secondary | ICD-10-CM | POA: Diagnosis not present

## 2020-01-24 ENCOUNTER — Other Ambulatory Visit: Payer: Self-pay | Admitting: Pharmacist

## 2020-01-24 NOTE — Patient Outreach (Signed)
[facility] ([facility])  [facility] Quality Pharmacy Team    [facility] pharmacy case will be closed as our team is transitioning from the [facility] Care Management Department into the [facility] Quality Department and will no longer be using [facility] for documentation purposes.      Twanna Resh J. Octavious Zidek, PharmD, BCACP [facility] Clinical Pharmacist [phone]   

## 2020-02-10 DIAGNOSIS — M5136 Other intervertebral disc degeneration, lumbar region: Secondary | ICD-10-CM | POA: Diagnosis not present

## 2020-02-10 DIAGNOSIS — M7918 Myalgia, other site: Secondary | ICD-10-CM | POA: Diagnosis not present

## 2020-02-10 DIAGNOSIS — J811 Chronic pulmonary edema: Secondary | ICD-10-CM | POA: Diagnosis not present

## 2020-02-10 DIAGNOSIS — M25551 Pain in right hip: Secondary | ICD-10-CM | POA: Diagnosis not present

## 2020-02-10 DIAGNOSIS — R609 Edema, unspecified: Secondary | ICD-10-CM | POA: Diagnosis not present

## 2020-02-10 DIAGNOSIS — M1611 Unilateral primary osteoarthritis, right hip: Secondary | ICD-10-CM | POA: Diagnosis not present

## 2020-02-10 DIAGNOSIS — M47816 Spondylosis without myelopathy or radiculopathy, lumbar region: Secondary | ICD-10-CM | POA: Diagnosis not present

## 2020-02-10 DIAGNOSIS — R600 Localized edema: Secondary | ICD-10-CM | POA: Diagnosis not present

## 2020-02-14 DIAGNOSIS — I350 Nonrheumatic aortic (valve) stenosis: Secondary | ICD-10-CM | POA: Diagnosis not present

## 2020-02-14 DIAGNOSIS — I5030 Unspecified diastolic (congestive) heart failure: Secondary | ICD-10-CM | POA: Diagnosis not present

## 2020-02-14 DIAGNOSIS — N183 Chronic kidney disease, stage 3 unspecified: Secondary | ICD-10-CM | POA: Diagnosis not present

## 2020-02-14 DIAGNOSIS — M7918 Myalgia, other site: Secondary | ICD-10-CM | POA: Diagnosis not present

## 2020-02-14 DIAGNOSIS — E1122 Type 2 diabetes mellitus with diabetic chronic kidney disease: Secondary | ICD-10-CM | POA: Diagnosis not present

## 2020-02-17 DIAGNOSIS — I340 Nonrheumatic mitral (valve) insufficiency: Secondary | ICD-10-CM | POA: Diagnosis not present

## 2020-02-17 DIAGNOSIS — I10 Essential (primary) hypertension: Secondary | ICD-10-CM | POA: Diagnosis not present

## 2020-02-17 DIAGNOSIS — I361 Nonrheumatic tricuspid (valve) insufficiency: Secondary | ICD-10-CM | POA: Diagnosis not present

## 2020-02-17 DIAGNOSIS — I442 Atrioventricular block, complete: Secondary | ICD-10-CM | POA: Diagnosis not present

## 2020-02-24 DIAGNOSIS — G8929 Other chronic pain: Secondary | ICD-10-CM | POA: Diagnosis not present

## 2020-02-24 DIAGNOSIS — M5441 Lumbago with sciatica, right side: Secondary | ICD-10-CM | POA: Diagnosis not present

## 2020-02-24 DIAGNOSIS — R600 Localized edema: Secondary | ICD-10-CM | POA: Diagnosis not present

## 2020-02-24 DIAGNOSIS — M5442 Lumbago with sciatica, left side: Secondary | ICD-10-CM | POA: Diagnosis not present

## 2020-02-26 ENCOUNTER — Encounter (HOSPITAL_BASED_OUTPATIENT_CLINIC_OR_DEPARTMENT_OTHER): Payer: Self-pay | Admitting: Emergency Medicine

## 2020-02-26 ENCOUNTER — Emergency Department (HOSPITAL_BASED_OUTPATIENT_CLINIC_OR_DEPARTMENT_OTHER)
Admission: EM | Admit: 2020-02-26 | Discharge: 2020-02-26 | Disposition: A | Discharge status: Home / Self Care | Payer: HMO | Attending: Emergency Medicine | Admitting: Emergency Medicine

## 2020-02-26 ENCOUNTER — Other Ambulatory Visit: Payer: Self-pay

## 2020-02-26 ENCOUNTER — Telehealth: Payer: Self-pay

## 2020-02-26 ENCOUNTER — Emergency Department (HOSPITAL_BASED_OUTPATIENT_CLINIC_OR_DEPARTMENT_OTHER): Payer: HMO

## 2020-02-26 DIAGNOSIS — I10 Essential (primary) hypertension: Secondary | ICD-10-CM | POA: Insufficient documentation

## 2020-02-26 DIAGNOSIS — Z79899 Other long term (current) drug therapy: Secondary | ICD-10-CM | POA: Diagnosis not present

## 2020-02-26 DIAGNOSIS — G309 Alzheimer's disease, unspecified: Secondary | ICD-10-CM | POA: Diagnosis not present

## 2020-02-26 DIAGNOSIS — G301 Alzheimer's disease with late onset: Secondary | ICD-10-CM | POA: Diagnosis not present

## 2020-02-26 DIAGNOSIS — Z7984 Long term (current) use of oral hypoglycemic drugs: Secondary | ICD-10-CM | POA: Diagnosis not present

## 2020-02-26 DIAGNOSIS — R590 Localized enlarged lymph nodes: Secondary | ICD-10-CM | POA: Insufficient documentation

## 2020-02-26 DIAGNOSIS — R109 Unspecified abdominal pain: Secondary | ICD-10-CM | POA: Diagnosis not present

## 2020-02-26 DIAGNOSIS — E119 Type 2 diabetes mellitus without complications: Secondary | ICD-10-CM | POA: Diagnosis not present

## 2020-02-26 DIAGNOSIS — Z87891 Personal history of nicotine dependence: Secondary | ICD-10-CM | POA: Diagnosis not present

## 2020-02-26 DIAGNOSIS — Z7982 Long term (current) use of aspirin: Secondary | ICD-10-CM | POA: Insufficient documentation

## 2020-02-26 DIAGNOSIS — R591 Generalized enlarged lymph nodes: Secondary | ICD-10-CM | POA: Diagnosis not present

## 2020-02-26 DIAGNOSIS — M545 Low back pain: Secondary | ICD-10-CM | POA: Diagnosis present

## 2020-02-26 DIAGNOSIS — Z8546 Personal history of malignant neoplasm of prostate: Secondary | ICD-10-CM | POA: Diagnosis not present

## 2020-02-26 DIAGNOSIS — F0281 Dementia in other diseases classified elsewhere with behavioral disturbance: Secondary | ICD-10-CM | POA: Insufficient documentation

## 2020-02-26 MED ORDER — LIDOCAINE 4 % EX CREA
TOPICAL_CREAM | Freq: Once | CUTANEOUS | Status: AC
  Administered 2020-02-26: 1 via TOPICAL
  Filled 2020-02-26: qty 5

## 2020-02-26 MED ORDER — KETOROLAC TROMETHAMINE 60 MG/2ML IM SOLN
30.0000 mg | Freq: Once | INTRAMUSCULAR | Status: AC
  Administered 2020-02-26: 30 mg via INTRAMUSCULAR
  Filled 2020-02-26: qty 2

## 2020-02-26 NOTE — ED Triage Notes (Signed)
Pt reports left flank pain for past 3 weeks; given muscle relaxant by doctor but provided no relief, last dose taken at 0300. Pt reports no changes in urinary output or appearance, no burning or pain with voiding

## 2020-02-26 NOTE — Telephone Encounter (Signed)
Called wife [name] to give number of Oncology Navigator [phone]. Asked about how patient was doing. She states" patient in much less pain. Medications seem to be working"

## 2020-02-26 NOTE — ED Provider Notes (Signed)
[facility] EMERGENCY DEPARTMENT Provider Note  CSN: [id number] Arrival date & time: 02/26/20 0447  Chief Complaint(s) Back Pain  HPI Kenneth Lyons is a 79 y.o. male with a past medical history listed below including remote history of prostate cancer in the 90s with a 10-year surveillance that was unremarkable who presents to the emergency department with 3 weeks of gradually worsening lower back pain.  Patient has been seen by his primary care provider and had plain films revealing degenerative disc disease with mild anterolisthesis without bony lesions or fractures.  Patient was placed on tramadol to which he did not respond well to.  He was subsequently placed on baclofen and Neurontin which he is tolerating well and does not provide relief.  Pain is worse with lying down.  Improved with other positions.  Patient denies any trauma.  No new bladder/bowel incontinence but does report having some mild bladder incontinence since January due to his severe hyperglycemic event.  No lower extremity weakness or loss of sensation.  No trouble with ambulation.  No recent fevers or infections.  No instrumentation.  No other physical complaints  HPI  Past Medical History Past Medical History:  Diagnosis Date  . Cancer ([place])   . Diabetes mellitus   . GERD (gastroesophageal reflux disease)   . High cholesterol   . Hypertension   . Stroke [facility])    Patient Active Problem List   Diagnosis Date Noted  . Cancer ([place]) 10/20/2019  . Stroke ([place]) 10/20/2019  . Diabetes mellitus ([place]) 10/20/2019  . Late onset Alzheimer's disease with behavioral disturbance ([place]) 01/03/2019  . Type 2 diabetes mellitus without retinopathy ([place]) 08/05/2018  . Tricuspid regurgitation 05/19/2018  . Hyperplastic rectal polyp 02/22/2018  . Dermatochalasis of both upper eyelids 08/04/2017  . Epiretinal membrane (ERM) of right eye 08/04/2017  . Keratoconjunctivitis sicca of both eyes not specified as Sjogren's  08/04/2017  . Pinguecula, left eye 08/04/2017  . Presbyopia of both eyes 08/04/2017  . Vitreous floater, bilateral 08/04/2017  . Dementia ([place]) 06/03/2016  . Type 2 diabetes mellitus with microalbuminuria, without long-term current use of insulin ([place]) 04/16/2016  . Benign paroxysmal positional vertigo due to bilateral vestibular disorder 04/16/2016  . Pacemaker reprogramming/check 04/16/2016  . Essential hypertension 10/29/2015  . H/O malignant neoplasm of prostate 10/29/2015  . H/O: CVA (cerebrovascular accident) 10/29/2015  . Actinic keratosis 10/29/2015  . Atrioventricular block, complete ([place]) 10/29/2015  . Calculus of kidney 10/29/2015  . Carotid bruit 10/29/2015  . Cervical spondylosis without myelopathy 10/29/2015  . DDD (degenerative disc disease), lumbosacral 10/29/2015  . Disorder of refraction 10/29/2015  . Diverticulosis of colon 10/29/2015  . Fredrickson type IIb or III hyperlipoproteinemia 10/29/2015  . Hearing loss 10/29/2015  . History of benign neoplasm of colon 10/29/2015  . Hypertriglyceridemia 10/29/2015  . Normocytic anemia 10/29/2015  . Osteoarthritis, knee 10/29/2015  . Right knee pain 10/29/2015  . Pseudophakia of both eyes 10/29/2015   Home Medication(s) Prior to Admission medications   Medication Sig Start Date End Date Taking? Authorizing Provider  alfuzosin (UROXATRAL) 10 MG 24 hr tablet TAKE 1 TABLET BY MOUTH ONCE DAILY 08/18/19  Yes [provider]  amLODipine (NORVASC) 10 MG tablet Take 10 mg by mouth daily.   Yes [provider]  aspirin EC 81 MG tablet Take by mouth.   Yes [provider]  baclofen (LIORESAL) 10 MG tablet Take by mouth. 02/24/20 03/25/20 Yes [provider]  Blood Glucose Monitoring Suppl ([place]) DEVI  Check blood sugar once daily.   Dx code: E11.9 11/06/15  Yes [provider]  Calcium-Magnesium-Vitamin D (CALCIUM 1200+D3 PO) Take 1 tablet by mouth daily. 08/31/18   Yes [provider]  carvedilol (COREG) 25 MG tablet TAKE 1 TABLET BY MOUTH TWICE DAILY WITH MEALS 01/28/18  Yes [provider]  Cholecalciferol (VITAMIN D-1000 MAX ST) 25 MCG (1000 UT) tablet Take 1 capsule by mouth daily.   Yes [provider]  clopidogrel (PLAVIX) 75 MG tablet TAKE 1 TABLET BY MOUTH ONCE DAILY 03/20/17  Yes [provider]  Cyanocobalamin 5000 MCG/ML LIQD Place one dose under the tongue daily   Yes [provider]  escitalopram (LEXAPRO) 10 MG tablet Take by mouth. 10/04/18 02/26/20 Yes [provider]  fenofibrate 160 MG tablet TAKE 1 TABLET BY MOUTH ONCE DAILY 10/04/18  Yes [provider]  ferrous sulfate 325 (65 FE) MG tablet Take 1 tablet by mouth daily.   Yes [provider]  furosemide (LASIX) 20 MG tablet Take by mouth. 02/17/20  Yes [provider]  glipiZIDE (GLUCOTROL) 10 MG tablet Take 10 mg by mouth 2 (two) times daily before a meal.   Yes [provider]  Glucosamine-Chondroitin 250-200 MG TABS Take 1 tablet by mouth 2 (two) times daily.   Yes [provider]  hydrochlorothiazide (HYDRODIURIL) 25 MG tablet TAKE 1 TABLET BY MOUTH ONCE DAILY 01/27/17  Yes [provider]  lisinopril (PRINIVIL,ZESTRIL) 40 MG tablet Take by mouth. 01/28/18  Yes [provider]  memantine (NAMENDA) 10 MG tablet TAKE 1 TABLET BY MOUTH TWICE DAILY 05/12/17  Yes [provider]  metFORMIN (GLUCOPHAGE) 1000 MG tablet TAKE 1 TABLET BY MOUTH TWICE DAILY WITH MEALS 01/28/18  Yes [provider]  Multiple Vitamin (MULTIVITAMIN) tablet Take 1 tablet by mouth daily.   Yes [provider]  Omega-3 Fatty Acids (FISH OIL PO) Take 2 capsules by mouth daily.   Yes [provider]  rosuvastatin (CRESTOR) 20 MG tablet Take 20 mg by mouth daily. 08/17/19  Yes [provider]  selenium 50 MCG TABS Take 50 mcg by mouth daily.   Yes [provider]   Dulaglutide (TRULICITY) 8.32 PQ/9.8YM SOPN Inject into the skin.    [provider]  meclizine (ANTIVERT) 25 MG tablet Take 1 tablet (25 mg total) by mouth 3 (three) times daily as needed for dizziness. 04/11/16   Deno Etienne, DO  sitaGLIPtin (JANUVIA) 100 MG tablet Take 100 mg by mouth daily.     [provider]                                                                                                                                    Past Surgical History Past Surgical History:  Procedure Laterality Date  . APPENDECTOMY    . PACEMAKER INSERTION    . PROSTATE SURGERY    . TONSILLECTOMY  Family History No family history on file.  Social History Social History   Tobacco Use  . Smoking status: Former Research scientist (life sciences)  . Smokeless tobacco: Never Used  Substance Use Topics  . Alcohol use: Yes    Comment: occ  . Drug use: No   Allergies Patient has no known allergies.  Review of Systems Review of Systems All other systems are reviewed and are negative for acute change except as noted in the HPI  Physical Exam Vital Signs  I have reviewed the triage vital signs BP (!) 144/57 (BP Location: Right Arm)   Pulse 77   Temp 98 F (36.7 C) (Oral)   Resp 18   Ht 5\' 7"  (1.702 m)   Wt 81.6 kg   SpO2 100%   BMI 28.19 kg/m   Physical Exam Vitals reviewed.  Constitutional:      General: He is not in acute distress.    Appearance: He is well-developed. He is not diaphoretic.  HENT:     Head: Normocephalic and atraumatic.     Jaw: No trismus.     Right Ear: External ear normal.     Left Ear: External ear normal.     Nose: Nose normal.  Eyes:     General: No scleral icterus.    Conjunctiva/sclera: Conjunctivae normal.  Neck:     Trachea: Phonation normal.  Cardiovascular:     Rate and Rhythm: Normal rate and regular rhythm.  Pulmonary:     Effort: Pulmonary effort is normal. No respiratory distress.     Breath sounds: No stridor.  Abdominal:     General:  There is no distension.  Musculoskeletal:        General: Normal range of motion.     Cervical back: Normal range of motion. No spasms, tenderness or bony tenderness.     Thoracic back: No spasms, tenderness or bony tenderness.     Lumbar back: No spasms, tenderness or bony tenderness.  Lymphadenopathy:     Cervical: No cervical adenopathy.     Upper Body:     Right upper body: No supraclavicular or axillary adenopathy.     Left upper body: No supraclavicular or axillary adenopathy.     Lower Body: No right inguinal adenopathy. No left inguinal adenopathy.  Neurological:     Mental Status: He is alert and oriented to person, place, and time.     Comments: Spine Exam: Strength: 5/5 throughout LE bilaterally  Sensation: Intact to light touch in proximal and distal LE bilaterally Reflexes: 1+ quadriceps and achilles reflexes   Psychiatric:        Behavior: Behavior normal.     ED Results and Treatments Labs (all labs ordered are listed, but only abnormal results are displayed) Labs Reviewed - No data to display                                                                                                                       EKG  EKG Interpretation  Date/Time:    Ventricular Rate:    PR Interval:    QRS Duration:   QT Interval:    QTC Calculation:   R Axis:     Text Interpretation:        Radiology CT Renal Stone Study  Result Date: 02/26/2020 CLINICAL DATA:  Low back pain.  LEFT flank pain for 3 weeks. EXAM: CT ABDOMEN AND PELVIS WITHOUT CONTRAST TECHNIQUE: Multidetector CT imaging of the abdomen and pelvis was performed following the standard protocol without IV contrast. COMPARISON:  CT 03/03/2012 FINDINGS: Lower chest: Lung bases are clear. Hepatobiliary: No focal hepatic lesion. No biliary duct dilatation. Gallbladder is normal. Common bile duct is normal. Pancreas: Pancreas is normal. No ductal dilatation. No pancreatic inflammation. Spleen: Normal spleen  Adrenals/urinary tract: Adrenal glands normal. Two nonobstructing calculi in the LEFT kidney measuring 4 mm each. Punctate calcification in the RIGHT kidney measuring 1 mm. No ureterolithiasis or obstructive uropathy. Bladder normal. Stomach/Bowel: Stomach, small-bowel and cecum are normal. The appendix is not identified but there is no pericecal inflammation to suggest appendicitis. The colon and rectosigmoid colon are normal. Vascular/Lymphatic: Calcification of the abdominal aorta. Normal caliber. There is bulky retroperitoneal periaortic lymphadenopathy which extends to the common iliac vessels. Lymph nodes surround the aorta and IVC. Lymph node mass on the LEFT at the level of the kidney measures 5.7 x 5.6 cm. LEFT common iliac lymph node measures 2.6 cm (image 43/2). LEFT external iliac lymph node measures 2.8 cm. No inguinal adenopathy. Reproductive: Post prostatectomy Other: No free fluid. Musculoskeletal: No aggressive osseous lesion. IMPRESSION: 1. Bulky retroperitoneal periaortic lymphadenopathy extending to the iliac nodal chains most consistent with LYMPHOMA. 2. Most inferior lymph node is a LEFT external iliac lymph node. 3. Normal volume spleen. 4. Bilateral nephrolithiasis. 5.  Aortic Atherosclerosis (ICD10-I70.0). Findings conveyed toPEDRO Deddrick Saindon on 02/26/2020  at06:22. Electronically Signed   By: Suzy Bouchard M.D.   On: 02/26/2020 06:22    Pertinent labs & imaging results that were available during my care of the patient were reviewed by me and considered in my medical decision making (see chart for details).  Medications Ordered in ED Medications  ketorolac (TORADOL) injection 30 mg (30 mg Intramuscular Given 02/26/20 0641)  lidocaine (LMX) 4 % cream (1 application Topical Given 02/26/20 0640)                                                                                                                                    Procedures Procedures  (including critical care time)  Medical  Decision Making / ED Course I have reviewed the nursing notes for this encounter and the patient's prior records (if available in EHR or on provided paperwork).   Bradden Tadros was evaluated in Emergency Department on 02/26/2020 for the symptoms described in the history of present illness. He was evaluated in the context of the global COVID-19 pandemic, which necessitated consideration that the patient might  be at risk for infection with the SARS-CoV-2 virus that causes COVID-19. Institutional protocols and algorithms that pertain to the evaluation of patients at risk for COVID-19 are in a state of rapid change based on information released by regulatory bodies including the CDC and federal and state organizations. These policies and algorithms were followed during the patient's care in the ED.  Patient presents with 3 weeks of gradually worsening lower back pain which is not responding to prescribed medication by primary care.  Patient denies any acute red flag symptoms that would be concerning for cauda equina.  Patient does have a history of cancer.  Last CT scan 2013 also revealed renal stones.  Will obtain a CT stone study to assess for any other possible etiologies besides musculoskeletal.  CT revealed retroperitoneal/periaortic lymphadenopathy concerning for lymphoma.  Patient has had recent labs that were unchanged from prior and reassuring without significant anemia, thrombocytopenia and normal cell counts.  Patient was provided with a dose of Toradol and lidocaine cream. Will provide an ambulatory referral to oncology.  Recommended close follow-up with PCP to further evaluate and work-up possible lymphoma.      Final Clinical Impression(s) / ED Diagnoses Final diagnoses:  Retroperitoneal lymphadenopathy   The patient appears reasonably screened and/or stabilized for discharge and I doubt any other medical condition or other [facility] requiring further screening, evaluation, or treatment in  the ED at this time prior to discharge. Safe for discharge with strict return precautions.  Disposition: Discharge  Condition: Good  I have discussed the results, Dx and Tx plan with the patient/family who expressed understanding and agree(s) with the plan. Discharge instructions discussed at length. The patient/family was given strict return precautions who verbalized understanding of the instructions. No further questions at time of discharge.    ED Discharge Orders         Ordered    Ambulatory referral to Oncology     02/26/20 0647          Follow Up: Nicola Girt, [address] [place] [address] [phone]  Schedule an appointment as soon as possible for a visit  For close follow up to assess for possible new lymphoma      This chart was dictated using voice recognition software.  Despite best efforts to proofread,  errors can occur which can change the documentation meaning.   Fatima Blank, MD 02/26/20 [phone]

## 2020-02-27 ENCOUNTER — Encounter: Payer: Self-pay | Admitting: *Deleted

## 2020-02-27 NOTE — Progress Notes (Signed)
Reached out to [employer] to introduce myself as the office RN Navigator and explain our new patient process. Spoke with his wife, [name]. Reviewed the reason for their referral and scheduled their new patient appointment along with labs. Provided address and directions to the office including call back phone number. Reviewed with patient any concerns they may have or any possible barriers to attending their appointment.   Informed patient about my role as a navigator and that I will meet with them prior to their New Patient appointment and more fully discuss what services I can provide. At this time patient has no further questions or needs.

## 2020-02-28 ENCOUNTER — Encounter: Payer: Self-pay | Admitting: *Deleted

## 2020-02-28 ENCOUNTER — Other Ambulatory Visit: Payer: Self-pay

## 2020-02-28 ENCOUNTER — Inpatient Hospital Stay (HOSPITAL_BASED_OUTPATIENT_CLINIC_OR_DEPARTMENT_OTHER): Payer: HMO | Admitting: Hematology & Oncology

## 2020-02-28 ENCOUNTER — Encounter: Payer: Self-pay | Admitting: Hematology & Oncology

## 2020-02-28 ENCOUNTER — Inpatient Hospital Stay: Payer: HMO

## 2020-02-28 ENCOUNTER — Inpatient Hospital Stay: Payer: HMO | Attending: Hematology & Oncology

## 2020-02-28 VITALS — BP 129/46 | HR 67 | Temp 97.8°F | Resp 16 | Wt 181.0 lb

## 2020-02-28 DIAGNOSIS — Z950 Presence of cardiac pacemaker: Secondary | ICD-10-CM | POA: Insufficient documentation

## 2020-02-28 DIAGNOSIS — Z9079 Acquired absence of other genital organ(s): Secondary | ICD-10-CM | POA: Diagnosis not present

## 2020-02-28 DIAGNOSIS — D500 Iron deficiency anemia secondary to blood loss (chronic): Secondary | ICD-10-CM

## 2020-02-28 DIAGNOSIS — R809 Proteinuria, unspecified: Secondary | ICD-10-CM

## 2020-02-28 DIAGNOSIS — Z7902 Long term (current) use of antithrombotics/antiplatelets: Secondary | ICD-10-CM | POA: Diagnosis not present

## 2020-02-28 DIAGNOSIS — R599 Enlarged lymph nodes, unspecified: Secondary | ICD-10-CM | POA: Diagnosis not present

## 2020-02-28 DIAGNOSIS — E1129 Type 2 diabetes mellitus with other diabetic kidney complication: Secondary | ICD-10-CM | POA: Diagnosis not present

## 2020-02-28 DIAGNOSIS — Z7189 Other specified counseling: Secondary | ICD-10-CM

## 2020-02-28 DIAGNOSIS — C8243 Follicular lymphoma grade IIIb, intra-abdominal lymph nodes: Secondary | ICD-10-CM | POA: Diagnosis not present

## 2020-02-28 DIAGNOSIS — K909 Intestinal malabsorption, unspecified: Secondary | ICD-10-CM | POA: Diagnosis not present

## 2020-02-28 DIAGNOSIS — M7989 Other specified soft tissue disorders: Secondary | ICD-10-CM | POA: Diagnosis not present

## 2020-02-28 DIAGNOSIS — Z8546 Personal history of malignant neoplasm of prostate: Secondary | ICD-10-CM | POA: Diagnosis not present

## 2020-02-28 DIAGNOSIS — C642 Malignant neoplasm of left kidney, except renal pelvis: Secondary | ICD-10-CM | POA: Diagnosis not present

## 2020-02-28 DIAGNOSIS — E119 Type 2 diabetes mellitus without complications: Secondary | ICD-10-CM | POA: Insufficient documentation

## 2020-02-28 DIAGNOSIS — G893 Neoplasm related pain (acute) (chronic): Secondary | ICD-10-CM | POA: Insufficient documentation

## 2020-02-28 DIAGNOSIS — D630 Anemia in neoplastic disease: Secondary | ICD-10-CM | POA: Insufficient documentation

## 2020-02-28 DIAGNOSIS — D518 Other vitamin B12 deficiency anemias: Secondary | ICD-10-CM

## 2020-02-28 DIAGNOSIS — C801 Malignant (primary) neoplasm, unspecified: Secondary | ICD-10-CM

## 2020-02-28 DIAGNOSIS — Z87891 Personal history of nicotine dependence: Secondary | ICD-10-CM | POA: Diagnosis not present

## 2020-02-28 DIAGNOSIS — R32 Unspecified urinary incontinence: Secondary | ICD-10-CM | POA: Insufficient documentation

## 2020-02-28 DIAGNOSIS — Z79899 Other long term (current) drug therapy: Secondary | ICD-10-CM | POA: Insufficient documentation

## 2020-02-28 DIAGNOSIS — C778 Secondary and unspecified malignant neoplasm of lymph nodes of multiple regions: Secondary | ICD-10-CM | POA: Diagnosis not present

## 2020-02-28 HISTORY — DX: Other specified counseling: Z71.89

## 2020-02-28 LAB — CMP (CANCER CENTER ONLY)
ALT: 11 U/L (ref 0–44)
AST: 14 U/L — ABNORMAL LOW (ref 15–41)
Albumin: 3.6 g/dL (ref 3.5–5.0)
Alkaline Phosphatase: 40 U/L (ref 38–126)
Anion gap: 9 (ref 5–15)
BUN: 33 mg/dL — ABNORMAL HIGH (ref 8–23)
CO2: 29 mmol/L (ref 22–32)
Calcium: 10.2 mg/dL (ref 8.9–10.3)
Chloride: 105 mmol/L (ref 98–111)
Creatinine: 1.16 mg/dL (ref 0.61–1.24)
GFR, Est AFR Am: >60 mL/min (ref >60)
GFR, Estimated: >60 mL/min (ref >60)
Glucose, Bld: 221 mg/dL — ABNORMAL HIGH (ref 70–99)
Potassium: 4.5 mmol/L (ref 3.5–5.1)
Sodium: 143 mmol/L (ref 135–145)
Total Bilirubin: 0.2 mg/dL — ABNORMAL LOW (ref 0.3–1.2)
Total Protein: 6.4 g/dL — ABNORMAL LOW (ref 6.5–8.1)

## 2020-02-28 LAB — ABO/RH: ABO/RH(D): O POS

## 2020-02-28 LAB — CBC WITH DIFFERENTIAL (CANCER CENTER ONLY)
Abs Immature Granulocytes: 0.05 10*3/uL (ref 0.00–0.07)
Basophils Absolute: 0.1 10*3/uL (ref 0.0–0.1)
Basophils Relative: 1 %
Eosinophils Absolute: 0.1 10*3/uL (ref 0.0–0.5)
Eosinophils Relative: 1 %
HCT: 20.7 % — ABNORMAL LOW (ref 39.0–52.0)
Hemoglobin: 6.2 g/dL — CL (ref 13.0–17.0)
Immature Granulocytes: 1 %
Lymphocytes Relative: 6 %
Lymphs Abs: 0.6 10*3/uL — ABNORMAL LOW (ref 0.7–4.0)
MCH: 23.2 pg — ABNORMAL LOW (ref 26.0–34.0)
MCHC: 30 g/dL (ref 30.0–36.0)
MCV: 77.5 fL — ABNORMAL LOW (ref 80.0–100.0)
Monocytes Absolute: 0.6 10*3/uL (ref 0.1–1.0)
Monocytes Relative: 7 %
Neutro Abs: 7.9 10*3/uL — ABNORMAL HIGH (ref 1.7–7.7)
Neutrophils Relative %: 84 %
Platelet Count: 476 10*3/uL — ABNORMAL HIGH (ref 150–400)
RBC: 2.67 MIL/uL — ABNORMAL LOW (ref 4.22–5.81)
RDW: 16.5 % — ABNORMAL HIGH (ref 11.5–15.5)
WBC Count: 9.3 10*3/uL (ref 4.0–10.5)
nRBC: 0 % (ref 0.0–0.2)

## 2020-02-28 LAB — RETICULOCYTES
Immature Retic Fract: 17.2 % — ABNORMAL HIGH (ref 2.3–15.9)
RBC.: 2.72 MIL/uL — ABNORMAL LOW (ref 4.22–5.81)
Retic Count, Absolute: 28.8 10*3/uL (ref 19.0–186.0)
Retic Ct Pct: 1.1 % (ref 0.4–3.1)

## 2020-02-28 LAB — LACTATE DEHYDROGENASE: LDH: 287 U/L — ABNORMAL HIGH (ref 98–192)

## 2020-02-28 LAB — SAVE SMEAR(SSMR), FOR PROVIDER SLIDE REVIEW

## 2020-02-28 LAB — PREPARE RBC (CROSSMATCH)

## 2020-02-28 LAB — VITAMIN B12: Vitamin B-12: 4142 pg/mL — ABNORMAL HIGH (ref 180–914)

## 2020-02-28 NOTE — Progress Notes (Signed)
Initial RN Navigator Patient Visit  Name: Kenneth Lyons Date of Referral : 02/27/2020 Diagnosis: Adenopathy   Met with patient prior to their visit with MD. [name] patient "Your Patient Navigator" handout which explains my role, areas in which I am able to help, and all the contact information for myself and the office. Also gave patient MD and Navigator business card. Reviewed with patient the general overview of expected course after initial diagnosis and time frame for all steps to be completed.  New patient packet given to patient which includes: orientation to office and staff; campus directory; education on My Chart and Advance Directives; and patient centered education on cancer and lymphoma.   Patient completed visit with Dr. Marin Olp  Revisited with patient after MD visit. Patient will need  2units of PRBC scheduled for 02/28/2020 PET Scan - scheduled for 03/06/2020 at [facility] for sooner apt.  CT Biopsy - message sent to IR scheduling to expedite biopsy appointment.   Will follow up with patient tomorrow during his infusion appointment to include PET scan appointment and instruction.  Patient understands all follow up procedures and expectations. They have my number to reach out for any further clarification or additional needs.

## 2020-02-28 NOTE — Progress Notes (Signed)
Referral MD  Reason for Referral: Retroperitoneal Adenopathy;  Microcytic anemia  Chief Complaint  Patient presents with  . New Patient (Initial Visit)  : I went to the emergency room and was told to have lymphoma.  HPI: [name] is a very nice 79 year old white male.  He is originally from [place].  He is a retired Engineer, maintenance (IT).  He had prostate cancer probably about 25 years ago.  He had a prostatectomy for this.  He has incontinence.  He has been having problems lying down at nighttime.  He has had no weight loss.  He has had no obvious issues with bowels or bladder.  He was given some Ultram which caused him mental status changes.  Apparently, any narcotic that he uses, he has problems with.  He subsequently went to the emergency room on May 9.  He had a CT scan done.  This showed bulky retroperitoneal periaortic adenopathy which extends to the common iliac vessels.  The lymph node mass measured 5.7 x 5.6 cm.  A left common iliac node measured 2.6 cm.  I left external iliac lymph node measures 2.8 cm.  Unfortunately, he did not have any blood work done at the time.  He was subsequently referred to the [facility] for an evaluation.  He is quite pale.  He has diabetes.  His blood sugar today is 221.  What is most concerning is that his hemoglobin is down to 6.2.  He has had no obvious melena or hematochezia.  I actually did a rectal exam on him.  The rectal exam did not show any obvious blood in his stool.  The MCV is 77.5.  I do believe he is iron deficient.  He has not felt any palpable lymph glands.  There is no history of cancer in the family.  There is no history of anemia in the family.  He used to smoke but stopped back about 45 years ago.  He really does not have much in the way of alcohol.  There is been no rashes.  He has had no nausea or vomiting.  Of note, he does have a pacemaker in place.  He is on Plavix and baby aspirin.  His main problem  clearly has been the fact that is hard for him to lie down on his back.  Overall, his performance status is ECOG 1.    Past Medical History:  Diagnosis Date  . Cancer ([place])   . Diabetes mellitus   . GERD (gastroesophageal reflux disease)   . High cholesterol   . Hypertension   . Stroke [place])   :  Past Surgical History:  Procedure Laterality Date  . APPENDECTOMY    . PACEMAKER INSERTION    . PROSTATE SURGERY    . TONSILLECTOMY    :   Current Outpatient Medications:  .  alfuzosin (UROXATRAL) 10 MG 24 hr tablet, TAKE 1 TABLET BY MOUTH ONCE DAILY, Disp: , Rfl:  .  amLODipine (NORVASC) 10 MG tablet, Take 10 mg by mouth daily., Disp: , Rfl:  .  aspirin EC 81 MG tablet, Take by mouth., Disp: , Rfl:  .  baclofen (LIORESAL) 10 MG tablet, Take by mouth., Disp: , Rfl:  .  Calcium-Magnesium-Vitamin D (CALCIUM 1200+D3 PO), Take 1 tablet by mouth daily., Disp: , Rfl:  .  carvedilol (COREG) 25 MG tablet, TAKE 1 TABLET BY MOUTH TWICE DAILY WITH MEALS, Disp: , Rfl:  .  Cholecalciferol (VITAMIN D-1000 MAX ST) 25 MCG (1000  UT) tablet, Take 1 capsule by mouth daily., Disp: , Rfl:  .  clopidogrel (PLAVIX) 75 MG tablet, TAKE 1 TABLET BY MOUTH ONCE DAILY, Disp: , Rfl:  .  Cyanocobalamin 5000 MCG/ML LIQD, Place one dose under the tongue daily, Disp: , Rfl:  .  Dulaglutide (TRULICITY) 2.58 NI/7.7OE SOPN, Inject into the skin., Disp: , Rfl:  .  escitalopram (LEXAPRO) 10 MG tablet, Take by mouth., Disp: , Rfl:  .  ferrous sulfate 325 (65 FE) MG tablet, Take 1 tablet by mouth daily., Disp: , Rfl:  .  furosemide (LASIX) 20 MG tablet, Take by mouth., Disp: , Rfl:  .  gabapentin (NEURONTIN) 100 MG capsule, Take 100 mg by mouth at bedtime as needed., Disp: , Rfl:  .  glipiZIDE (GLUCOTROL) 10 MG tablet, Take 10 mg by mouth 2 (two) times daily before a meal., Disp: , Rfl:  .  Glucosamine-Chondroitin 250-200 MG TABS, Take 1 tablet by mouth 2 (two) times daily., Disp: , Rfl:  .  hydrochlorothiazide  (HYDRODIURIL) 25 MG tablet, TAKE 1 TABLET BY MOUTH ONCE DAILY, Disp: , Rfl:  .  lisinopril (PRINIVIL,ZESTRIL) 40 MG tablet, Take by mouth., Disp: , Rfl:  .  memantine (NAMENDA) 10 MG tablet, TAKE 1 TABLET BY MOUTH TWICE DAILY, Disp: , Rfl:  .  metFORMIN (GLUCOPHAGE) 1000 MG tablet, TAKE 1 TABLET BY MOUTH TWICE DAILY WITH MEALS, Disp: , Rfl:  .  Multiple Vitamin (MULTIVITAMIN) tablet, Take 1 tablet by mouth daily., Disp: , Rfl:  .  Omega-3 Fatty Acids (FISH OIL PO), Take 2 capsules by mouth daily., Disp: , Rfl:  .  rosuvastatin (CRESTOR) 20 MG tablet, Take 20 mg by mouth daily., Disp: , Rfl:  .  selenium 50 MCG TABS, Take 50 mcg by mouth daily., Disp: , Rfl: :  :  No Known Allergies:  History reviewed. No pertinent family history.:  Social History   Socioeconomic History  . Marital status: Married    Spouse name: Not on file  . Number of children: Not on file  . Years of education: Not on file  . Highest education level: Not on file  Occupational History    Comment: Retired Engineer, maintenance (IT)  Tobacco Use  . Smoking status: Former Research scientist (life sciences)  . Smokeless tobacco: Never Used  Substance and Sexual Activity  . Alcohol use: Yes    Comment: occ  . Drug use: No  . Sexual activity: Not on file  Other Topics Concern  . Not on file  Social History Narrative  . Not on file   Social Determinants of Health   Financial Resource Strain: Low Risk   . Difficulty of Paying Living Expenses: Not hard at all  Food Insecurity: No Food Insecurity  . Worried About Charity fundraiser in the Last Year: Never true  . Ran Out of Food in the Last Year: Never true  Transportation Needs: No Transportation Needs  . Lack of Transportation (Medical): No  . Lack of Transportation (Non-Medical): No  Physical Activity: Inactive  . Days of Exercise per Week: 0 days  . Minutes of Exercise per Session: 0 min  Stress: No Stress Concern Present  . Feeling of Stress : Only a little  Social Connections: Unknown  . Frequency  of Communication with Friends and Family: More than three times a week  . Frequency of Social Gatherings with Friends and Family: Not on file  . Attends Religious Services: Not on file  . Active Member of Clubs or Organizations: Not on  file  . Attends Archivist Meetings: Not on file  . Marital Status: Married  Human resources officer Violence: Not At Risk  . Fear of Current or Ex-Partner: No  . Emotionally Abused: No  . Physically Abused: No  . Sexually Abused: No  :  Review of Systems  Constitutional: Negative.   HENT: Negative.   Eyes: Negative.   Respiratory: Negative.   Cardiovascular: Negative.   Gastrointestinal: Negative.   Genitourinary: Negative.   Musculoskeletal: Positive for back pain.  Skin: Negative.   Neurological: Negative.   Endo/Heme/Allergies: Negative.   Psychiatric/Behavioral: Negative.      Exam:  This is an elderly appearing white male in no obvious distress.  His vital signs show temperature of 97.8.  Pulse 67.  Blood pressure 129/46.  Weight is 181 pounds.  Head and neck exam shows no ocular or oral lesions.  He has no scleral icterus.  There is no adenopathy in his neck.  He has no palpable thyroid.  His lungs are clear bilaterally.  Cardiac exam regular rate and rhythm.  He has a 1/6 systolic ejection murmur.  Abdomen is soft.  Looks like he has a anterior abdominal wall hernia.  He has no guarding or rebound tenderness.  There is no palpable fluid wave.  There is no palpable liver or spleen tip.  I cannot palpate any adenopathy.  Back exam shows no tenderness over the spine, ribs or hips.  Extremities shows some trace edema in his lower legs.  He has decent range of motion of his joints.  Skin exam shows no suspicious skin lesions.  He has had Mohs surgery on his nose.  Neurological exam shows no focal neurological deficits.     @IPVITALS @   Recent Labs    02/28/20 0950  WBC 9.3  HGB 6.2*  HCT 20.7*  PLT 476*   Recent Labs     02/28/20 0950  NA 143  K 4.5  CL 105  CO2 29  GLUCOSE 221*  BUN 33*  CREATININE 1.16  CALCIUM 10.2    Blood smear review: He has marked hypochromic white blood cells.  I see a couple neutrophils that have 8 nuclei.  He has numerous polys that have 6 nuclei.  He has hypochromic red blood cells.  I see no schistocytes.  He has no nucleated red blood cells.  Platelets are adequate and well granulated.  Pathology: Pending    Assessment and Plan: Mr. Fontanilla is a very nice 79 year old white male.  He has this retroperitoneal mass.  It does look like lymphadenopathy.  I would have to believe that this is going to be lymphoma.  I cannot imagine this being sarcoma or anything else.  I do not think this is anything related to prostate cancer.  Ultimately, he will need a biopsy.  Unfortunate, he is on Plavix so he will like to come off Plavix for about 5 days.  He is also quite anemic.  Have to believe that he is iron deficient despite the fact that I cannot find any blood in his stool.  I also think that he may have pernicious anemia given the blood smear.  The hypersegmented polys are quite prominent.  He will need to be transfused.  We will go ahead and give him 2 units of blood.  This I think would be very important.  We will have to see what his iron studies show.  We will see what the B12 studies show.  We will try  to get the biopsy set up for next week.  Hopefully, if this is lymphoma, this will not be a high-grade lymphoma and that we can treat this fairly aggressively.  As far as a treatment for lymphoma, we will have to see what the pathology shows.  I do think we must will get a PET scan on him.  I spent about an hour with he and his wife.  They are both very very nice.  We went over the scans.  We went over the lab work.  We will have him come back on Wednesday for a blood transfusion.

## 2020-02-29 ENCOUNTER — Telehealth: Payer: Self-pay | Admitting: Hematology & Oncology

## 2020-02-29 ENCOUNTER — Inpatient Hospital Stay: Payer: HMO

## 2020-02-29 ENCOUNTER — Telehealth: Payer: Self-pay

## 2020-02-29 ENCOUNTER — Other Ambulatory Visit: Payer: Self-pay

## 2020-02-29 ENCOUNTER — Encounter: Payer: Self-pay | Admitting: Hematology & Oncology

## 2020-02-29 ENCOUNTER — Encounter: Payer: Self-pay | Admitting: *Deleted

## 2020-02-29 ENCOUNTER — Other Ambulatory Visit: Payer: Self-pay | Admitting: Family

## 2020-02-29 ENCOUNTER — Encounter (HOSPITAL_COMMUNITY): Payer: Self-pay | Admitting: Radiology

## 2020-02-29 VITALS — BP 150/48 | HR 68 | Temp 97.1°F | Resp 17

## 2020-02-29 DIAGNOSIS — D500 Iron deficiency anemia secondary to blood loss (chronic): Secondary | ICD-10-CM

## 2020-02-29 DIAGNOSIS — C8243 Follicular lymphoma grade IIIb, intra-abdominal lymph nodes: Secondary | ICD-10-CM

## 2020-02-29 DIAGNOSIS — E1129 Type 2 diabetes mellitus with other diabetic kidney complication: Secondary | ICD-10-CM

## 2020-02-29 DIAGNOSIS — C8593 Non-Hodgkin lymphoma, unspecified, intra-abdominal lymph nodes: Secondary | ICD-10-CM

## 2020-02-29 DIAGNOSIS — C801 Malignant (primary) neoplasm, unspecified: Secondary | ICD-10-CM

## 2020-02-29 DIAGNOSIS — C642 Malignant neoplasm of left kidney, except renal pelvis: Secondary | ICD-10-CM | POA: Diagnosis not present

## 2020-02-29 HISTORY — DX: Non-hodgkin lymphoma, unspecified, intra-abdominal lymph nodes: C85.93

## 2020-02-29 HISTORY — DX: Iron deficiency anemia secondary to blood loss (chronic): D50.0

## 2020-02-29 LAB — IRON AND TIBC
Iron: 11 ug/dL — ABNORMAL LOW (ref 42–163)
Saturation Ratios: 3 % — ABNORMAL LOW (ref 20–55)
TIBC: 314 ug/dL (ref 202–409)
UIBC: 303 ug/dL (ref 117–376)

## 2020-02-29 LAB — PROSTATE-SPECIFIC AG, SERUM (LABCORP): Prostate Specific Ag, Serum: <0.1 ng/mL (ref 0.0–4.0)

## 2020-02-29 LAB — ERYTHROPOIETIN: Erythropoietin: 61.9 m[IU]/mL — ABNORMAL HIGH (ref 2.6–18.5)

## 2020-02-29 LAB — BETA 2 MICROGLOBULIN, SERUM: Beta-2 Microglobulin: 3.5 mg/L — ABNORMAL HIGH (ref 0.6–2.4)

## 2020-02-29 LAB — FERRITIN: Ferritin: 410 ng/mL — ABNORMAL HIGH (ref 24–336)

## 2020-02-29 MED ORDER — FUROSEMIDE 10 MG/ML IJ SOLN: 20.0000 mg | Freq: Once | INTRAMUSCULAR | Status: DC

## 2020-02-29 MED ORDER — SODIUM CHLORIDE 0.9 % IV SOLN
Freq: Once | INTRAVENOUS | Status: AC
  Filled 2020-02-29: qty 250

## 2020-02-29 MED ORDER — SODIUM CHLORIDE 0.9% IV SOLUTION
250.0000 mL | Freq: Once | INTRAVENOUS | Status: AC
  Administered 2020-02-29: 250 mL via INTRAVENOUS
  Filled 2020-02-29: qty 250

## 2020-02-29 MED ORDER — SODIUM CHLORIDE 0.9 % IV SOLN
200.0000 mg | Freq: Once | INTRAVENOUS | Status: AC
  Administered 2020-02-29: 200 mg via INTRAVENOUS
  Filled 2020-02-29: qty 200

## 2020-02-29 MED ORDER — FENTANYL 12 MCG/HR TD PT72: 1.0000 | MEDICATED_PATCH | TRANSDERMAL | 0 refills | Status: DC

## 2020-02-29 NOTE — Progress Notes (Signed)
Kenneth Lyons Male, 79 y.o., 1941-07-16 MRN:  [id number] Phone:  [phone] Kenneth Lyons) PCP:  Kenneth Girt, DO Coverage:  [employer] Next Appt With Radiology ([facility]-PET CT1) 03/06/2020 at 8:30 AM  RE: CT Biopsy Received: Today Message Contents  Kenneth Cleveland, MD  Kenneth Lyons  Ok   CT core L ext iliac LAN   DDH       Previous Messages   ----- Message -----  From: Kenneth Lyons D  Sent: 02/28/2020  6:53 PM EDT  To: Ir Procedure Requests  Subject: CT Biopsy                     Procedure:  CT Biopsy   Reason: Enlarged lymph nodes, Cancer   History: CT in computer, NM PET scheduled for 03/06/50.        Received message from [facility] asking if we needed PET imaging or could you use the        CT Renal Study that was done.   Provider: Volanda Napoleon   Provider Contact: [phone]

## 2020-02-29 NOTE — Telephone Encounter (Signed)
Contacted Kenneth Lyons, Development worker, community re: Dr Antonieta Pert request for pt to get iron infusion while in the office today. Per Kenneth Lyons, ok for pt to get IV Venofer with current insurance. dph

## 2020-02-29 NOTE — Progress Notes (Signed)
Venofer 200mg  x 2 infusions. No additional appts needed at this time.  Hardie Pulley, PharmD, BCPS, BCOP

## 2020-02-29 NOTE — Patient Instructions (Signed)

## 2020-02-29 NOTE — Addendum Note (Signed)
Addended by: Burney Gauze R on: 02/29/2020 12:15 PM   Modules accepted: Orders

## 2020-02-29 NOTE — Progress Notes (Signed)
Kenneth Lyons Male, 79 y.o., 1941/06/15 MRN:  [id number] Phone:  [phone] [name]) PCP:  Nicola Girt, DO Coverage:  [employer]mo Next Appt With Radiology ([facility]-PET CT1) 03/06/2020 at 8:30 AM  RE: CT Biopsy Received: Today Message Contents  Ennever, Rudell Cobb, MD  Garth Bigness D  Yes, please hold. pete       Previous Messages   ----- Message -----  From: Garth Bigness D  Sent: 02/28/2020  6:59 PM EDT  To: Cordelia Poche, RN, Volanda Napoleon, MD  Subject: FW: CT Biopsy                   Patient is on Plavix and it will need to be held 5 days prior to biopsy being done. Please advise if it is okay to hold. Thanks Aniceto Boss  ----- Message -----  From: Garth Bigness D  Sent: 02/28/2020  6:50 PM EDT  To: Cordelia Poche, RN  Subject: RE: CT Biopsy                   That is up to the Radiologist, I will see what they say once review comes back.  ----- Message -----  From: Cordelia Poche, RN  Sent: 02/28/2020  1:44 PM EDT  To: Jillyn Hidden  Subject: CT Biopsy                     Good afternoon,   This patient has a new order for a CT biopsy. Do you know if they will schedule off the CT scan from Sunday or will they wait until the PET scan results come in? Patient and wife are extremely anxious to get this done and MD would prefer biopsy sometime next week.   Thanks for any help you can provide,   Roselyn Reef

## 2020-02-29 NOTE — Telephone Encounter (Signed)
No los 5/11

## 2020-02-29 NOTE — Progress Notes (Signed)
Reviewed PET Scan appointment with patient including prep. Written instructions also given to patient.   Reached out to IR schedulers to see if biopsy could be done off the renal CT or if scheduling will have to wait on PET. They are awaiting radiologist response.

## 2020-03-01 ENCOUNTER — Other Ambulatory Visit: Payer: Self-pay | Admitting: Hematology & Oncology

## 2020-03-01 LAB — BPAM RBC
Blood Product Expiration Date: 202105312359
Blood Product Expiration Date: 202106022359
ISSUE DATE / TIME: 202105120756
ISSUE DATE / TIME: 202105120756
Unit Type and Rh: 5100
Unit Type and Rh: 5100

## 2020-03-01 LAB — TYPE AND SCREEN
ABO/RH(D): O POS
Antibody Screen: NEGATIVE
Unit division: 0
Unit division: 0

## 2020-03-06 ENCOUNTER — Encounter
Admission: RE | Admit: 2020-03-06 | Discharge: 2020-03-06 | Disposition: A | Discharge status: Home / Self Care | Payer: HMO | Source: Ambulatory Visit | Attending: Hematology & Oncology | Admitting: Hematology & Oncology

## 2020-03-06 ENCOUNTER — Other Ambulatory Visit: Payer: Self-pay

## 2020-03-06 ENCOUNTER — Other Ambulatory Visit: Payer: Self-pay | Admitting: Radiology

## 2020-03-06 DIAGNOSIS — R599 Enlarged lymph nodes, unspecified: Secondary | ICD-10-CM | POA: Diagnosis present

## 2020-03-06 DIAGNOSIS — R591 Generalized enlarged lymph nodes: Secondary | ICD-10-CM | POA: Diagnosis not present

## 2020-03-06 DIAGNOSIS — Z8546 Personal history of malignant neoplasm of prostate: Secondary | ICD-10-CM | POA: Insufficient documentation

## 2020-03-06 DIAGNOSIS — I2510 Atherosclerotic heart disease of native coronary artery without angina pectoris: Secondary | ICD-10-CM | POA: Insufficient documentation

## 2020-03-06 DIAGNOSIS — I700 Atherosclerosis of aorta: Secondary | ICD-10-CM | POA: Diagnosis not present

## 2020-03-06 LAB — GLUCOSE, CAPILLARY
Glucose-Capillary: 92 mg/dL (ref 70–99)
Glucose-Capillary: 78 mg/dL (ref 70–99)
Glucose-Capillary: 67 mg/dL — ABNORMAL LOW (ref 70–99)

## 2020-03-06 MED ORDER — FLUDEOXYGLUCOSE F - 18 (FDG) INJECTION
9.4000 | Freq: Once | INTRAVENOUS | Status: AC | PRN
  Administered 2020-03-06: 9.74 via INTRAVENOUS

## 2020-03-07 ENCOUNTER — Inpatient Hospital Stay: Payer: HMO

## 2020-03-07 VITALS — BP 150/48 | HR 69 | Resp 18

## 2020-03-07 DIAGNOSIS — Z950 Presence of cardiac pacemaker: Secondary | ICD-10-CM | POA: Diagnosis not present

## 2020-03-07 DIAGNOSIS — Z9079 Acquired absence of other genital organ(s): Secondary | ICD-10-CM | POA: Diagnosis not present

## 2020-03-07 DIAGNOSIS — Z87891 Personal history of nicotine dependence: Secondary | ICD-10-CM | POA: Diagnosis not present

## 2020-03-07 DIAGNOSIS — K909 Intestinal malabsorption, unspecified: Secondary | ICD-10-CM | POA: Diagnosis not present

## 2020-03-07 DIAGNOSIS — Z7902 Long term (current) use of antithrombotics/antiplatelets: Secondary | ICD-10-CM | POA: Diagnosis not present

## 2020-03-07 DIAGNOSIS — C778 Secondary and unspecified malignant neoplasm of lymph nodes of multiple regions: Secondary | ICD-10-CM | POA: Diagnosis not present

## 2020-03-07 DIAGNOSIS — M7989 Other specified soft tissue disorders: Secondary | ICD-10-CM | POA: Diagnosis not present

## 2020-03-07 DIAGNOSIS — D630 Anemia in neoplastic disease: Secondary | ICD-10-CM | POA: Diagnosis not present

## 2020-03-07 DIAGNOSIS — G893 Neoplasm related pain (acute) (chronic): Secondary | ICD-10-CM | POA: Diagnosis not present

## 2020-03-07 DIAGNOSIS — R32 Unspecified urinary incontinence: Secondary | ICD-10-CM | POA: Diagnosis not present

## 2020-03-07 DIAGNOSIS — Z8546 Personal history of malignant neoplasm of prostate: Secondary | ICD-10-CM | POA: Diagnosis not present

## 2020-03-07 DIAGNOSIS — C8243 Follicular lymphoma grade IIIb, intra-abdominal lymph nodes: Secondary | ICD-10-CM

## 2020-03-07 DIAGNOSIS — C642 Malignant neoplasm of left kidney, except renal pelvis: Secondary | ICD-10-CM | POA: Diagnosis not present

## 2020-03-07 DIAGNOSIS — D500 Iron deficiency anemia secondary to blood loss (chronic): Secondary | ICD-10-CM

## 2020-03-07 DIAGNOSIS — Z79899 Other long term (current) drug therapy: Secondary | ICD-10-CM | POA: Diagnosis not present

## 2020-03-07 DIAGNOSIS — E119 Type 2 diabetes mellitus without complications: Secondary | ICD-10-CM | POA: Diagnosis not present

## 2020-03-07 MED ORDER — SODIUM CHLORIDE 0.9 % IV SOLN
Freq: Once | INTRAVENOUS | Status: AC
  Filled 2020-03-07: qty 250

## 2020-03-07 MED ORDER — SODIUM CHLORIDE 0.9 % IV SOLN
200.0000 mg | Freq: Once | INTRAVENOUS | Status: AC
  Administered 2020-03-07: 200 mg via INTRAVENOUS
  Filled 2020-03-07: qty 200

## 2020-03-07 NOTE — Patient Instructions (Signed)

## 2020-03-08 ENCOUNTER — Other Ambulatory Visit: Payer: Self-pay

## 2020-03-08 ENCOUNTER — Encounter (HOSPITAL_COMMUNITY): Payer: Self-pay

## 2020-03-08 ENCOUNTER — Ambulatory Visit (HOSPITAL_COMMUNITY)
Admission: RE | Admit: 2020-03-08 | Discharge: 2020-03-08 | Disposition: A | Discharge status: Home / Self Care | Payer: HMO | Source: Ambulatory Visit | Attending: Hematology & Oncology | Admitting: Hematology & Oncology

## 2020-03-08 ENCOUNTER — Encounter: Payer: Self-pay | Admitting: *Deleted

## 2020-03-08 ENCOUNTER — Other Ambulatory Visit: Payer: Self-pay | Admitting: Hematology & Oncology

## 2020-03-08 DIAGNOSIS — E119 Type 2 diabetes mellitus without complications: Secondary | ICD-10-CM | POA: Diagnosis not present

## 2020-03-08 DIAGNOSIS — K219 Gastro-esophageal reflux disease without esophagitis: Secondary | ICD-10-CM | POA: Diagnosis not present

## 2020-03-08 DIAGNOSIS — E7800 Pure hypercholesterolemia, unspecified: Secondary | ICD-10-CM | POA: Diagnosis not present

## 2020-03-08 DIAGNOSIS — C801 Malignant (primary) neoplasm, unspecified: Secondary | ICD-10-CM

## 2020-03-08 DIAGNOSIS — Z794 Long term (current) use of insulin: Secondary | ICD-10-CM | POA: Insufficient documentation

## 2020-03-08 DIAGNOSIS — Z79899 Other long term (current) drug therapy: Secondary | ICD-10-CM | POA: Insufficient documentation

## 2020-03-08 DIAGNOSIS — I10 Essential (primary) hypertension: Secondary | ICD-10-CM | POA: Insufficient documentation

## 2020-03-08 DIAGNOSIS — C770 Secondary and unspecified malignant neoplasm of lymph nodes of head, face and neck: Secondary | ICD-10-CM | POA: Insufficient documentation

## 2020-03-08 DIAGNOSIS — Z7982 Long term (current) use of aspirin: Secondary | ICD-10-CM | POA: Insufficient documentation

## 2020-03-08 DIAGNOSIS — R599 Enlarged lymph nodes, unspecified: Secondary | ICD-10-CM | POA: Diagnosis not present

## 2020-03-08 DIAGNOSIS — D509 Iron deficiency anemia, unspecified: Secondary | ICD-10-CM | POA: Insufficient documentation

## 2020-03-08 DIAGNOSIS — I700 Atherosclerosis of aorta: Secondary | ICD-10-CM | POA: Insufficient documentation

## 2020-03-08 DIAGNOSIS — Z8673 Personal history of transient ischemic attack (TIA), and cerebral infarction without residual deficits: Secondary | ICD-10-CM | POA: Diagnosis not present

## 2020-03-08 DIAGNOSIS — N200 Calculus of kidney: Secondary | ICD-10-CM | POA: Insufficient documentation

## 2020-03-08 DIAGNOSIS — Z87891 Personal history of nicotine dependence: Secondary | ICD-10-CM | POA: Diagnosis not present

## 2020-03-08 DIAGNOSIS — R590 Localized enlarged lymph nodes: Secondary | ICD-10-CM | POA: Diagnosis present

## 2020-03-08 DIAGNOSIS — Z8546 Personal history of malignant neoplasm of prostate: Secondary | ICD-10-CM | POA: Diagnosis not present

## 2020-03-08 HISTORY — PX: IR US GUIDE BX ASP/DRAIN: IMG2392

## 2020-03-08 LAB — CBC WITH DIFFERENTIAL/PLATELET
Abs Immature Granulocytes: 0.04 10*3/uL (ref 0.00–0.07)
Basophils Absolute: 0.1 10*3/uL (ref 0.0–0.1)
Basophils Relative: 1 %
Eosinophils Absolute: 0.3 10*3/uL (ref 0.0–0.5)
Eosinophils Relative: 4 %
HCT: 26.4 % — ABNORMAL LOW (ref 39.0–52.0)
Hemoglobin: 7.8 g/dL — ABNORMAL LOW (ref 13.0–17.0)
Immature Granulocytes: 1 %
Lymphocytes Relative: 8 %
Lymphs Abs: 0.6 10*3/uL — ABNORMAL LOW (ref 0.7–4.0)
MCH: 23.7 pg — ABNORMAL LOW (ref 26.0–34.0)
MCHC: 29.5 g/dL — ABNORMAL LOW (ref 30.0–36.0)
MCV: 80.2 fL (ref 80.0–100.0)
Monocytes Absolute: 0.7 10*3/uL (ref 0.1–1.0)
Monocytes Relative: 9 %
Neutro Abs: 6.2 10*3/uL (ref 1.7–7.7)
Neutrophils Relative %: 77 %
Platelets: 390 10*3/uL (ref 150–400)
RBC: 3.29 MIL/uL — ABNORMAL LOW (ref 4.22–5.81)
RDW: 17.1 % — ABNORMAL HIGH (ref 11.5–15.5)
WBC: 7.9 10*3/uL (ref 4.0–10.5)
nRBC: 0 % (ref 0.0–0.2)

## 2020-03-08 LAB — PROTIME-INR
INR: 1.1 (ref 0.8–1.2)
Prothrombin Time: 13.6 seconds (ref 11.4–15.2)

## 2020-03-08 LAB — GLUCOSE, CAPILLARY: Glucose-Capillary: 114 mg/dL — ABNORMAL HIGH (ref 70–99)

## 2020-03-08 MED ORDER — SODIUM CHLORIDE 0.9 % IV SOLN: INTRAVENOUS | Status: DC

## 2020-03-08 MED ORDER — LIDOCAINE-EPINEPHRINE 1 %-1:100000 IJ SOLN
INTRAMUSCULAR | Status: AC
  Filled 2020-03-08: qty 1

## 2020-03-08 MED ORDER — LIDOCAINE-EPINEPHRINE (PF) 1 %-1:200000 IJ SOLN
INTRAMUSCULAR | Status: AC | PRN
  Administered 2020-03-08: 10 mL

## 2020-03-08 MED ORDER — MIDAZOLAM HCL 2 MG/2ML IJ SOLN
INTRAMUSCULAR | Status: AC
  Filled 2020-03-08: qty 2

## 2020-03-08 NOTE — Progress Notes (Signed)
Patient's wife calling asking what the next steps are now that patient has had his PET scan and now his biopsy this morning. Patient not yet scheduled for follow up appointment. Made appointment for follow up with his wife for next week so that pathology results could be discussed and treatment plan presented. She was grateful for the information and appointment time.

## 2020-03-08 NOTE — Discharge Instructions (Signed)
May remove dressing and shower (wash gently with soap and water) in 24 to 48 hours.  Keep site clean and dry and replace bandaid as necessary  Urgent needs on call IR MD [phone]  Needle Biopsy, Care After This sheet gives you information about how to care for yourself after your procedure. Your health care provider may also give you more specific instructions. If you have problems or questions, contact your health care provider. What can I expect after the procedure? After the procedure, it is common to have soreness, bruising, or mild pain at the puncture site. This should go away in a few days. Follow these instructions at home: Needle insertion site care  Wash your hands with soap and water before you change your bandage (dressing). If you cannot use soap and water, use hand sanitizer.  Follow instructions from your health care provider about how to take care of your puncture site. This includes: ? When and how to change your dressing. ? When to remove your dressing.  Check your puncture site every day for signs of infection. Check for: ? Redness, swelling, or pain. ? Fluid or blood. ? Pus or a bad smell. ? Warmth. General instructions  Return to your normal activities as told by your health care provider. Ask your health care provider what activities are safe for you.  Do not take baths, swim, or use a hot tub until your health care provider approves. Ask your health care provider if you may take showers. You may only be allowed to take sponge baths.  Take over-the-counter and prescription medicines only as told by your health care provider.  Keep all follow-up visits as told by your health care provider. This is important. Contact a health care provider if:  You have a fever.  You have redness, swelling, or pain at the puncture site that lasts longer than a few days.  You have fluid, blood, or pus coming from your puncture site.  Your puncture site feels warm to the  touch. Get help right away if:  You have severe bleeding from the puncture site. Summary  After the procedure, it is common to have soreness, bruising, or mild pain at the puncture site. This should go away in a few days.  Check your puncture site every day for signs of infection, such as redness, swelling, or pain.  Get help right away if you have severe bleeding from your puncture site. This information is not intended to replace advice given to you by your health care provider. Make sure you discuss any questions you have with your health care provider. Document Revised: 12/18/2017 Document Reviewed: 10/19/2017 Elsevier Patient Education  [employer].   Moderate Conscious Sedation, Adult, Care After These instructions provide you with information about caring for yourself after your procedure. Your health care provider may also give you more specific instructions. Your treatment has been planned according to current medical practices, but problems sometimes occur. Call your health care provider if you have any problems or questions after your procedure. What can I expect after the procedure? After your procedure, it is common:  To feel sleepy for several hours.  To feel clumsy and have poor balance for several hours.  To have poor judgment for several hours.  To vomit if you eat too soon. Follow these instructions at home: For at least 24 hours after the procedure:  Do not: ? Participate in activities where you could fall or become injured. ? Drive. ? Use heavy  machinery. ? Drink alcohol. ? Take sleeping pills or medicines that cause drowsiness. ? Make important decisions or sign legal documents. ? Take care of children on your own.  Rest. Eating and drinking  Follow the diet recommended by your health care provider.  If you vomit: ? Drink water, juice, or soup when you can drink without vomiting. ? Make sure you have little or no nausea before eating solid  foods. General instructions  Have a responsible adult stay with you until you are awake and alert.  Take over-the-counter and prescription medicines only as told by your health care provider.  If you smoke, do not smoke without supervision.  Keep all follow-up visits as told by your health care provider. This is important. Contact a health care provider if:  You keep feeling nauseous or you keep vomiting.  You feel light-headed.  You develop a rash.  You have a fever. Get help right away if:  You have trouble breathing. This information is not intended to replace advice given to you by your health care provider. Make sure you discuss any questions you have with your health care provider. Document Revised: 09/18/2017 Document Reviewed: 01/26/2016 Elsevier Patient Education  2020 [employer].

## 2020-03-08 NOTE — H&P (Signed)
Chief Complaint: Patient was seen in consultation today for left external iliac lymphadenopathy/biopsy.  Referring Physician(s): Ennever,Peter R  Supervising Physician: Jacqulynn Cadet  Patient Status: [facility] - Out-pt  History of Present Illness: Kenneth Lyons is a 79 y.o. male with a past medical history of hypertension, high cholesterol, GERD, diabetes mellitus, and iron deficiency anemia. He presented to [place] ED 02/26/2020 with complaint of back pain. CT renal study revealed bulky retroperitoneal lymphadenopathy, most consistent with lymphoma. He was referred to oncology on an outpatient basis for further management. NM PET was obtained which revealed hypermetabolic bulky lymphadenopathy.  CT renal stone study 02/26/2020: 1. Bulky retroperitoneal periaortic lymphadenopathy extending to the iliac nodal chains most consistent with LYMPHOMA. 2. Most inferior lymph node is a LEFT external iliac lymph node. 3. Normal volume spleen. 4. Bilateral nephrolithiasis. 5.  Aortic Atherosclerosis (ICD10-I70.0).  NM PET 03/06/2020: 1. Moderately hypermetabolic left supraclavicular, superior mediastinal, retroperitoneal and pelvic lymphadenopathy, most consistent with lymphoma. Recent serum PSA level less than 0.1 ng/ml making metastatic prostate cancer unlikely in this patient with a remote history of prostate cancer. 2. No solid visceral organ or osseous involvement.  IR requested by Dr. Marin Olp for possible image-guided left external iliac lymph node biopsy for tissue diagnosis. Patient awake and alert laying in bed with no complaints at this time. Denies fever, chills, chest pain, dyspnea, abdominal pain, or headache.   Past Medical History:  Diagnosis Date  . Cancer ([place])   . Diabetes mellitus   . GERD (gastroesophageal reflux disease)   . Goals of care, counseling/discussion 02/28/2020  . High cholesterol   . Hypertension   . Iron deficiency anemia due to chronic blood loss  02/29/2020  . 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 Stroke [facility])     Past Surgical History:  Procedure Laterality Date  . APPENDECTOMY    . PACEMAKER INSERTION    . PROSTATE SURGERY    . TONSILLECTOMY      Allergies: Patient has no known allergies.  Medications: Prior to Admission medications   Medication Sig Start Date End Date Taking? Authorizing Provider  alfuzosin (UROXATRAL) 10 MG 24 hr tablet TAKE 1 TABLET BY MOUTH ONCE DAILY 08/18/19  Yes [provider]  amLODipine (NORVASC) 10 MG tablet Take 10 mg by mouth daily.   Yes [provider]  aspirin EC 81 MG tablet Take by mouth.   Yes [provider]  baclofen (LIORESAL) 10 MG tablet Take by mouth. 02/24/20 03/25/20 Yes [provider]  Calcium-Magnesium-Vitamin D (CALCIUM 1200+D3 PO) Take 1 tablet by mouth daily. 08/31/18  Yes [provider]  carvedilol (COREG) 25 MG tablet TAKE 1 TABLET BY MOUTH TWICE DAILY WITH MEALS 01/28/18  Yes [provider]  Cholecalciferol (VITAMIN D-1000 MAX ST) 25 MCG (1000 UT) tablet Take 1 capsule by mouth daily.   Yes [provider]  Cyanocobalamin 5000 MCG/ML LIQD Place one dose under the tongue daily   Yes [provider]  Dulaglutide (TRULICITY) 5.32 DJ/2.4QA SOPN Inject into the skin.   Yes [provider]  fentaNYL (DURAGESIC) 12 MCG/HR Place 1 patch onto the skin every 3 (three) days. 02/29/20  Yes Volanda Napoleon, MD  ferrous sulfate 325 (65 FE) MG tablet Take 1 tablet by mouth daily.   Yes [provider]  furosemide (LASIX) 20 MG tablet Take by mouth. 02/17/20  Yes [provider]  gabapentin (NEURONTIN) 100 MG capsule Take 100 mg by mouth at bedtime as  needed. 02/24/20  Yes [provider]  glipiZIDE (GLUCOTROL) 10 MG tablet Take 10 mg by mouth 2 (two) times daily before a meal.   Yes [provider]  Glucosamine-Chondroitin 250-200 MG TABS Take 1 tablet by mouth 2  (two) times daily.   Yes [provider]  hydrochlorothiazide (HYDRODIURIL) 25 MG tablet TAKE 1 TABLET BY MOUTH ONCE DAILY 01/27/17  Yes [provider]  lisinopril (PRINIVIL,ZESTRIL) 40 MG tablet Take by mouth. 01/28/18  Yes [provider]  memantine (NAMENDA) 10 MG tablet TAKE 1 TABLET BY MOUTH TWICE DAILY 05/12/17  Yes [provider]  metFORMIN (GLUCOPHAGE) 1000 MG tablet TAKE 1 TABLET BY MOUTH TWICE DAILY WITH MEALS 01/28/18  Yes [provider]  Multiple Vitamin (MULTIVITAMIN) tablet Take 1 tablet by mouth daily.   Yes [provider]  Omega-3 Fatty Acids (FISH OIL PO) Take 2 capsules by mouth daily.   Yes [provider]  rosuvastatin (CRESTOR) 20 MG tablet Take 20 mg by mouth daily. 08/17/19  Yes [provider]  selenium 50 MCG TABS Take 50 mcg by mouth daily.   Yes [provider]  clopidogrel (PLAVIX) 75 MG tablet TAKE 1 TABLET BY MOUTH ONCE DAILY 03/20/17   [provider]  escitalopram (LEXAPRO) 10 MG tablet Take by mouth. 10/04/18 02/26/20  [provider]     History reviewed. No pertinent family history.  Social History   Socioeconomic History  . Marital status: Married    Spouse name: Not on file  . Number of children: Not on file  . Years of education: Not on file  . Highest education level: Not on file  Occupational History    Comment: Retired Engineer, maintenance (IT)  Tobacco Use  . Smoking status: Former Research scientist (life sciences)  . Smokeless tobacco: Never Used  Substance and Sexual Activity  . Alcohol use: Yes    Comment: occ  . Drug use: No  . Sexual activity: Not on file  Other Topics Concern  . Not on file  Social History Narrative  . Not on file   Social Determinants of Health   Financial Resource Strain: Low Risk   . Difficulty of Paying Living Expenses: Not hard at all  Food Insecurity: No Food Insecurity  . Worried About Charity fundraiser in the Last Year: Never true  . Ran Out of Food in  the Last Year: Never true  Transportation Needs: No Transportation Needs  . Lack of Transportation (Medical): No  . Lack of Transportation (Non-Medical): No  Physical Activity: Inactive  . Days of Exercise per Week: 0 days  . Minutes of Exercise per Session: 0 min  Stress: No Stress Concern Present  . Feeling of Stress : Only a little  Social Connections: Unknown  . Frequency of Communication with Friends and Family: More than three times a week  . Frequency of Social Gatherings with Friends and Family: Not on file  . Attends Religious Services: Not on file  . Active Member of Clubs or Organizations: Not on file  . Attends Archivist Meetings: Not on file  . Marital Status: Married     Review of Systems: A 12 point ROS discussed and pertinent positives are indicated in the HPI above.  All other systems are negative.  Review of Systems  Constitutional: Negative for chills and fever.  Respiratory: Negative for shortness of breath and wheezing.   Cardiovascular: Negative for chest pain and palpitations.  Gastrointestinal: Negative for abdominal pain.  Neurological: Negative  for headaches.  Psychiatric/Behavioral: Negative for behavioral problems and confusion.    Vital Signs: There were no vitals taken for this visit.  Physical Exam Vitals and nursing note reviewed.  Constitutional:      General: He is not in acute distress.    Appearance: Normal appearance.  Cardiovascular:     Rate and Rhythm: Normal rate and regular rhythm.     Heart sounds: Normal heart sounds. No murmur.  Pulmonary:     Effort: Pulmonary effort is normal. No respiratory distress.     Breath sounds: Normal breath sounds. No wheezing.  Skin:    General: Skin is warm and dry.  Neurological:     Mental Status: He is alert and oriented to person, place, and time.      MD Evaluation Airway: WNL Heart: WNL Abdomen: WNL Chest/ Lungs: WNL ASA  Classification: 2 Mallampati/Airway Score:  Two   Imaging: NM PET Image Initial (PI) Skull Base To Thigh  Result Date: 03/06/2020 CLINICAL DATA:  Initial treatment strategy for retroperitoneal and pelvic lymphadenopathy on abdominopelvic CT. Remote history of prostate cancer. EXAM: NUCLEAR MEDICINE PET SKULL BASE TO THIGH TECHNIQUE: 9.74 mCi F-18 FDG was injected intravenously. Full-ring PET imaging was performed from the skull base to thigh after the radiotracer. CT data was obtained and used for attenuation correction and anatomic localization. Fasting blood glucose: 78 mg/dl COMPARISON:  Abdominopelvic CT 02/26/2020 and 07/06/2012. FINDINGS: Mediastinal blood pool activity: SUV max 1.1 Liver activity: SUV max 3.2 NECK: There is an ill-defined conglomerate of hypermetabolic left supraclavicular lymph nodes extending into the left superior mediastinum. These measure up to 4.7 x 4.5 cm on image 63/3. SUV max 6.2. Apart from these nodes, no other hypermetabolic cervical lymph nodes.There are no lesions of the pharyngeal mucosal space. Incidental CT findings: none CHEST: Apart from the hypermetabolic left supraclavicular nodes which extend into the superior mediastinum, there are no other hypermetabolic thoracic lymph nodes. No hypermetabolic pulmonary activity or suspicious pulmonary nodularity. Incidental CT findings: Trace bilateral pleural effusions. The lungs appear clear. The heart is enlarged. Left subclavian pacemaker leads extend into the right atrium and right ventricle. There is atherosclerosis of the aorta, great vessels and coronary arteries. Calcifications of the aortic valve are noted. ABDOMEN/PELVIS: There is no hypermetabolic activity within the liver, adrenal glands, spleen or pancreas. The spleen is normal in size without focal hypermetabolic activity (SUV max 2.4). A right retrocrural node measuring 2.0 cm short axis on image 664/4 is hypermetabolic with an SUV max of 4.8. The extensive confluent retroperitoneal lymphadenopathy seen on  recent CT also demonstrates mild-to-moderate hypermetabolic activity. A left periaortic component measuring 6.7 x 6.3 cm on image 167/3 has an SUV max of 6.9. A left common iliac component with a short axis dimension of 3.5 cm on image 202/3 has an SUV max of 5.2. No inguinal adenopathy. Prior prostatectomy without abnormal activity in the prostatectomy bed. Incidental CT findings: Mild hepatic steatosis. Nonobstructing left renal calculi are demonstrated. No hydronephrosis or ureteral calculus. Diffuse aortic and branch vessel atherosclerosis. SKELETON: There is no hypermetabolic activity to suggest osseous metastatic disease. Incidental CT findings: Moderate facet hypertrophy in the lower lumbar spine. No blastic osseous lesions. IMPRESSION: 1. Moderately hypermetabolic left supraclavicular, superior mediastinal, retroperitoneal and pelvic lymphadenopathy, most consistent with lymphoma. Recent serum PSA level less than 0.1 ng/ml making metastatic prostate cancer unlikely in this patient with a remote history of prostate cancer. 2. No solid visceral organ or osseous involvement. Electronically Signed   By:  Richardean Sale M.D.   On: 03/06/2020 14:40   CT Renal Stone Study  Result Date: 02/26/2020 CLINICAL DATA:  Low back pain.  LEFT flank pain for 3 weeks. EXAM: CT ABDOMEN AND PELVIS WITHOUT CONTRAST TECHNIQUE: Multidetector CT imaging of the abdomen and pelvis was performed following the standard protocol without IV contrast. COMPARISON:  CT 03/03/2012 FINDINGS: Lower chest: Lung bases are clear. Hepatobiliary: No focal hepatic lesion. No biliary duct dilatation. Gallbladder is normal. Common bile duct is normal. Pancreas: Pancreas is normal. No ductal dilatation. No pancreatic inflammation. Spleen: Normal spleen Adrenals/urinary tract: Adrenal glands normal. Two nonobstructing calculi in the LEFT kidney measuring 4 mm each. Punctate calcification in the RIGHT kidney measuring 1 mm. No ureterolithiasis or  obstructive uropathy. Bladder normal. Stomach/Bowel: Stomach, small-bowel and cecum are normal. The appendix is not identified but there is no pericecal inflammation to suggest appendicitis. The colon and rectosigmoid colon are normal. Vascular/Lymphatic: Calcification of the abdominal aorta. Normal caliber. There is bulky retroperitoneal periaortic lymphadenopathy which extends to the common iliac vessels. Lymph nodes surround the aorta and IVC. Lymph node mass on the LEFT at the level of the kidney measures 5.7 x 5.6 cm. LEFT common iliac lymph node measures 2.6 cm (image 43/2). LEFT external iliac lymph node measures 2.8 cm. No inguinal adenopathy. Reproductive: Post prostatectomy Other: No free fluid. Musculoskeletal: No aggressive osseous lesion. IMPRESSION: 1. Bulky retroperitoneal periaortic lymphadenopathy extending to the iliac nodal chains most consistent with LYMPHOMA. 2. Most inferior lymph node is a LEFT external iliac lymph node. 3. Normal volume spleen. 4. Bilateral nephrolithiasis. 5.  Aortic Atherosclerosis (ICD10-I70.0). Findings conveyed toPEDRO CARDAMA on 02/26/2020  at06:22. Electronically Signed   By: Suzy Bouchard M.D.   On: 02/26/2020 06:22    Labs:  CBC: Recent Labs    10/30/19 1447 02/28/20 0950 03/08/20 0710  WBC 8.1 9.3 7.9  HGB 9.5* 6.2* 7.8*  HCT 30.3* 20.7* 26.4*  PLT 300 476* 390    COAGS: Recent Labs    03/08/20 0710  INR 1.1    BMP: Recent Labs    10/30/19 1447 02/28/20 0950  NA 137 143  K 3.9 4.5  CL 102 105  CO2 27 29  GLUCOSE 318* 221*  BUN 27* 33*  CALCIUM 9.6 10.2  CREATININE 1.16 1.16  GFRNONAA >60 >60  GFRAA >60 >60    LIVER FUNCTION TESTS: Recent Labs    10/30/19 1447 02/28/20 0950  BILITOT 0.3 0.2*  AST 19 14*  ALT 14 11  ALKPHOS 50 40  PROT 7.2 6.4*  ALBUMIN 3.6 3.6     Assessment and Plan:  Bulky lymphadenopathy (including left external iliac lymphadenopathy), hypermetabolic on NM PET 9/41/7408. Plan for  image-guided left external iliac lymph node biopsy today in IR. Patient is NPO. Afebrile.  Risks and benefits discussed with the patient including, but not limited to bleeding, infection, damage to adjacent structures or low yield requiring additional tests. All of the patient's questions were answered, patient is agreeable to proceed. Consent signed and in chart.   Thank you for this interesting consult.  I greatly enjoyed meeting Ferdie Bakken and look forward to participating in their care.  A copy of this report was sent to the requesting provider on this date.  Electronically Signed: Earley Abide, PA-C 03/08/2020, 8:45 AM   I spent a total of 30 Minutes in face to face in clinical consultation, greater than 50% of which was counseling/coordinating care for left external iliac lymphadenopathy/biopsy.

## 2020-03-08 NOTE — Procedures (Signed)
Interventional Radiology Procedure Note  Procedure: US guided core biopsy of left supraclavicular LNs  Complications: None  Estimated Blood Loss: None  Recommendations: - Bedrest x 1 hr - DC home   Signed,  Criselda Peaches, MD

## 2020-03-09 ENCOUNTER — Ambulatory Visit: Payer: Self-pay | Admitting: Pharmacist

## 2020-03-09 DIAGNOSIS — F0280 Dementia in other diseases classified elsewhere without behavioral disturbance: Secondary | ICD-10-CM | POA: Diagnosis not present

## 2020-03-09 DIAGNOSIS — G309 Alzheimer's disease, unspecified: Secondary | ICD-10-CM | POA: Diagnosis not present

## 2020-03-09 DIAGNOSIS — F0281 Dementia in other diseases classified elsewhere with behavioral disturbance: Secondary | ICD-10-CM | POA: Diagnosis not present

## 2020-03-09 DIAGNOSIS — G301 Alzheimer's disease with late onset: Secondary | ICD-10-CM | POA: Diagnosis not present

## 2020-03-10 ENCOUNTER — Encounter (HOSPITAL_BASED_OUTPATIENT_CLINIC_OR_DEPARTMENT_OTHER): Payer: Self-pay | Admitting: Emergency Medicine

## 2020-03-10 ENCOUNTER — Other Ambulatory Visit: Payer: Self-pay

## 2020-03-10 ENCOUNTER — Emergency Department (HOSPITAL_BASED_OUTPATIENT_CLINIC_OR_DEPARTMENT_OTHER)
Admission: EM | Admit: 2020-03-10 | Discharge: 2020-03-10 | Disposition: A | Discharge status: Home / Self Care | Payer: HMO | Attending: Emergency Medicine | Admitting: Emergency Medicine

## 2020-03-10 DIAGNOSIS — I10 Essential (primary) hypertension: Secondary | ICD-10-CM | POA: Diagnosis not present

## 2020-03-10 DIAGNOSIS — M545 Low back pain, unspecified: Secondary | ICD-10-CM

## 2020-03-10 DIAGNOSIS — C8590 Non-Hodgkin lymphoma, unspecified, unspecified site: Secondary | ICD-10-CM | POA: Insufficient documentation

## 2020-03-10 DIAGNOSIS — Z7984 Long term (current) use of oral hypoglycemic drugs: Secondary | ICD-10-CM | POA: Insufficient documentation

## 2020-03-10 DIAGNOSIS — Z79899 Other long term (current) drug therapy: Secondary | ICD-10-CM | POA: Diagnosis not present

## 2020-03-10 DIAGNOSIS — Z87891 Personal history of nicotine dependence: Secondary | ICD-10-CM | POA: Diagnosis not present

## 2020-03-10 DIAGNOSIS — G301 Alzheimer's disease with late onset: Secondary | ICD-10-CM | POA: Diagnosis not present

## 2020-03-10 DIAGNOSIS — Z7982 Long term (current) use of aspirin: Secondary | ICD-10-CM | POA: Diagnosis not present

## 2020-03-10 DIAGNOSIS — F0281 Dementia in other diseases classified elsewhere with behavioral disturbance: Secondary | ICD-10-CM | POA: Insufficient documentation

## 2020-03-10 DIAGNOSIS — E119 Type 2 diabetes mellitus without complications: Secondary | ICD-10-CM | POA: Insufficient documentation

## 2020-03-10 DIAGNOSIS — M5489 Other dorsalgia: Secondary | ICD-10-CM | POA: Diagnosis present

## 2020-03-10 HISTORY — DX: Dementia in other diseases classified elsewhere, unspecified severity, without behavioral disturbance, psychotic disturbance, mood disturbance, and anxiety: F02.80

## 2020-03-10 MED ORDER — HYDROCODONE-ACETAMINOPHEN 5-325 MG PO TABS: 0.5000 | ORAL_TABLET | Freq: Four times a day (QID) | ORAL | 0 refills | Status: AC | PRN

## 2020-03-10 MED ORDER — ACETAMINOPHEN 500 MG PO TABS
500.0000 mg | ORAL_TABLET | Freq: Once | ORAL | Status: AC
  Administered 2020-03-10: 500 mg via ORAL
  Filled 2020-03-10: qty 1

## 2020-03-10 MED ORDER — HYDROMORPHONE HCL 1 MG/ML IJ SOLN
0.5000 mg | Freq: Once | INTRAMUSCULAR | Status: AC
  Administered 2020-03-10: 0.5 mg via INTRAVENOUS
  Filled 2020-03-10: qty 1

## 2020-03-10 NOTE — ED Notes (Signed)
ED Provider at bedside. 

## 2020-03-10 NOTE — ED Provider Notes (Signed)
[facility] EMERGENCY DEPARTMENT Provider Note   CSN: [id number] Arrival date & time: 03/10/20  1340     History Chief Complaint  Patient presents with  . Back Pain    Kenneth Lyons is a 79 y.o. male.  Presented to emergency room with chief complaint of low back pain.  Patient has been having ongoing back pain over the last few weeks.  Unfortunately, CT scan that was performed in the ER couple weeks ago demonstrated significant lymph node masses, lymphadenopathy concerning for lymphoma.  Had lymph node biopsy, has not received results, has appointment with oncology next week to discuss and formulate treatment plan.  States that has been having issues with pain control, pain up to 10 out of 10 in severity, sharp, stabbing, nonradiating.  Worse on left side.  No falls.  Was given gabapentin, fentanyl patch, but did not like the way the gabapentin made him feel.  No dysuria, vomiting, fever, new bladder or bowel incontinence, numbness, weakness.  Additional history obtained via chart review, review of recent oncology notes, ER visit 5/9.  HPI     Past Medical History:  Diagnosis Date  . Alzheimer's dementia ([place])   . Cancer ([place])   . Diabetes mellitus   . GERD (gastroesophageal reflux disease)   . Goals of care, counseling/discussion 02/28/2020  . High cholesterol   . Hypertension   . Iron deficiency anemia due to chronic blood loss 02/29/2020  . 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 Stroke [facility])     Patient Active Problem List   Diagnosis Date Noted  . 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 Iron deficiency anemia due to chronic blood loss 02/29/2020  . Goals of care, counseling/discussion 02/28/2020  . Stroke ([place]) 10/20/2019  . Diabetes mellitus ([place]) 10/20/2019  . Late onset Alzheimer's disease with behavioral disturbance ([place]) 01/03/2019  . Type 2 diabetes mellitus without retinopathy ([place]) 08/05/2018  . Tricuspid regurgitation  05/19/2018  . Hyperplastic rectal polyp 02/22/2018  . Dermatochalasis of both upper eyelids 08/04/2017  . Epiretinal membrane (ERM) of right eye 08/04/2017  . Keratoconjunctivitis sicca of both eyes not specified as Sjogren's 08/04/2017  . Pinguecula, left eye 08/04/2017  . Presbyopia of both eyes 08/04/2017  . Vitreous floater, bilateral 08/04/2017  . Dementia ([place]) 06/03/2016  . Type 2 diabetes mellitus with microalbuminuria, without long-term current use of insulin ([place]) 04/16/2016  . Benign paroxysmal positional vertigo due to bilateral vestibular disorder 04/16/2016  . Pacemaker reprogramming/check 04/16/2016  . Essential hypertension 10/29/2015  . H/O malignant neoplasm of prostate 10/29/2015  . H/O: CVA (cerebrovascular accident) 10/29/2015  . Actinic keratosis 10/29/2015  . Atrioventricular block, complete ([place]) 10/29/2015  . Calculus of kidney 10/29/2015  . Carotid bruit 10/29/2015  . Cervical spondylosis without myelopathy 10/29/2015  . DDD (degenerative disc disease), lumbosacral 10/29/2015  . Disorder of refraction 10/29/2015  . Diverticulosis of colon 10/29/2015  . Fredrickson type IIb or III hyperlipoproteinemia 10/29/2015  . Hearing loss 10/29/2015  . History of benign neoplasm of colon 10/29/2015  . Hypertriglyceridemia 10/29/2015  . Osteoarthritis, knee 10/29/2015  . Right knee pain 10/29/2015  . Pseudophakia of both eyes 10/29/2015    Past Surgical History:  Procedure Laterality Date  . APPENDECTOMY    . IR US GUIDE BX ASP/DRAIN  03/08/2020  . PACEMAKER INSERTION    . PROSTATE SURGERY    . TONSILLECTOMY         No family history on file.  Social History   Tobacco Use  . Smoking status: Former Research scientist (life sciences)  . Smokeless tobacco: Never Used  Substance Use Topics  . Alcohol use: Yes    Comment: occ  . Drug use: No    Home Medications Prior to Admission medications   Medication Sig Start Date End Date Taking? Authorizing Provider  alfuzosin (UROXATRAL)  10 MG 24 hr tablet TAKE 1 TABLET BY MOUTH ONCE DAILY 08/18/19   [provider]  amLODipine (NORVASC) 10 MG tablet Take 10 mg by mouth daily.    [provider]  aspirin EC 81 MG tablet Take by mouth.    [provider]  baclofen (LIORESAL) 10 MG tablet Take by mouth. 02/24/20 03/25/20  [provider]  Calcium-Magnesium-Vitamin D (CALCIUM 1200+D3 PO) Take 1 tablet by mouth daily. 08/31/18   [provider]  carvedilol (COREG) 25 MG tablet TAKE 1 TABLET BY MOUTH TWICE DAILY WITH MEALS 01/28/18   [provider]  Cholecalciferol (VITAMIN D-1000 MAX ST) 25 MCG (1000 UT) tablet Take 1 capsule by mouth daily.    [provider]  clopidogrel (PLAVIX) 75 MG tablet TAKE 1 TABLET BY MOUTH ONCE DAILY 03/20/17   [provider]  Cyanocobalamin 5000 MCG/ML LIQD Place one dose under the tongue daily    [provider]  Dulaglutide (TRULICITY) 8.29 FA/2.1HY SOPN Inject into the skin.    [provider]  escitalopram (LEXAPRO) 10 MG tablet Take by mouth. 10/04/18 02/26/20  [provider]  fentaNYL (DURAGESIC) 12 MCG/HR Place 1 patch onto the skin every 3 (three) days. 02/29/20   Volanda Napoleon, MD  ferrous sulfate 325 (65 FE) MG tablet Take 1 tablet by mouth daily.    [provider]  furosemide (LASIX) 20 MG tablet Take by mouth. 02/17/20   [provider]  gabapentin (NEURONTIN) 100 MG capsule Take 100 mg by mouth at bedtime as needed. 02/24/20   [provider]  glipiZIDE (GLUCOTROL) 10 MG tablet Take 10 mg by mouth 2 (two) times daily before a meal.    [provider]  Glucosamine-Chondroitin 250-200 MG TABS Take 1 tablet by mouth 2 (two) times daily.    [provider]  hydrochlorothiazide (HYDRODIURIL) 25 MG tablet TAKE 1 TABLET BY MOUTH ONCE DAILY 01/27/17   [provider]  HYDROcodone-acetaminophen (NORCO/VICODIN) 5-325 MG tablet Take 0.5 tablets by mouth every 6  (six) hours as needed for up to 3 days for moderate pain. 03/10/20 03/13/20  Lucrezia Starch, MD  lisinopril (PRINIVIL,ZESTRIL) 40 MG tablet Take by mouth. 01/28/18   [provider]  memantine (NAMENDA) 10 MG tablet TAKE 1 TABLET BY MOUTH TWICE DAILY 05/12/17   [provider]  metFORMIN (GLUCOPHAGE) 1000 MG tablet TAKE 1 TABLET BY MOUTH TWICE DAILY WITH MEALS 01/28/18   [provider]  Multiple Vitamin (MULTIVITAMIN) tablet Take 1 tablet by mouth daily.    [provider]  Omega-3 Fatty Acids (FISH OIL PO) Take 2 capsules by mouth daily.    [provider]  rosuvastatin (CRESTOR) 20 MG tablet Take 20 mg by mouth daily. 08/17/19   [provider]  selenium 50 MCG TABS Take 50 mcg by mouth daily.    [provider]    Allergies    Patient has no known allergies.  Review of Systems   Review of Systems  Constitutional: Negative for chills and fever.  HENT: Negative for ear pain and sore throat.   Eyes: Negative for pain  and visual disturbance.  Respiratory: Negative for cough and shortness of breath.   Cardiovascular: Negative for chest pain and palpitations.  Gastrointestinal: Negative for abdominal pain and vomiting.  Genitourinary: Negative for dysuria and hematuria.  Musculoskeletal: Negative for arthralgias and back pain.  Skin: Negative for color change and rash.  Neurological: Negative for seizures and syncope.  All other systems reviewed and are negative.   Physical Exam Updated Vital Signs BP (!) 145/64 (BP Location: Right Arm)   Pulse 68   Temp 98.3 F (36.8 C) (Oral)   Resp 16   Ht 5\' 7"  (1.702 m)   Wt 81.6 kg   SpO2 100%   BMI 28.19 kg/m   Physical Exam Vitals and nursing note reviewed.  Constitutional:      Appearance: He is well-developed.  HENT:     Head: Normocephalic and atraumatic.  Eyes:     Conjunctiva/sclera: Conjunctivae normal.  Cardiovascular:     Rate and Rhythm: Normal rate and  regular rhythm.     Heart sounds: No murmur.  Pulmonary:     Effort: Pulmonary effort is normal. No respiratory distress.     Breath sounds: Normal breath sounds.  Abdominal:     Palpations: Abdomen is soft.     Tenderness: There is no abdominal tenderness.  Musculoskeletal:        General: No deformity or signs of injury.     Cervical back: Neck supple.     Comments: No focal bony tenderness over T or L-spine  Skin:    General: Skin is warm and dry.     Capillary Refill: Capillary refill takes less than 2 seconds.  Neurological:     General: No focal deficit present.     Mental Status: He is alert. Mental status is at baseline.     Comments: 5 out of 5 strength in bilateral lower extremities, sensation to light touch intact in bilateral lower extremities  Psychiatric:        Mood and Affect: Mood normal.        Behavior: Behavior normal.     ED Results / Procedures / Treatments   Labs (all labs ordered are listed, but only abnormal results are displayed) Labs Reviewed - No data to display  EKG None  Radiology No results found.  Procedures Procedures (including critical care time)  Medications Ordered in ED Medications  acetaminophen (TYLENOL) tablet 500 mg (500 mg Oral Given 03/10/20 1459)  HYDROmorphone (DILAUDID) injection 0.5 mg (0.5 mg Intravenous Given 03/10/20 1505)    ED Course  I have reviewed the triage vital signs and the nursing notes.  Pertinent labs & imaging results that were available during my care of the patient were reviewed by me and considered in my medical decision making (see chart for details).    MDM Rules/Calculators/A&P                      79 year old male presents to ER with back pain.  Patient has been having back pain for the last few weeks, no significant changes except increased difficulty with pain control.  This is suspected to be related to his newly diagnosed lymphoma, significant lymphadenopathy retroperitoneal, worse on left  side which correlates to his pain.  No other new complaints.  He is neuro intact.  Offered labs but patient and wife declined.  Provided symptomatic control with Dilaudid, Tylenol.  Symptoms significantly improved.  Will give short Rx for Norco.  Given his age, dementia,  recommended he start with only half tablet dosing, go up to full tablet as needed and as tolerated.  Reviewed compressions, already has follow-up scheduled with oncology next week for recheck and to discuss biopsy results.    After the discussed management above, the patient was determined to be safe for discharge.  The patient was in agreement with this plan and all questions regarding their care were answered.  ED return precautions were discussed and the patient will return to the ED with any significant worsening of condition.    Final Clinical Impression(s) / ED Diagnoses Final diagnoses:  Low back pain without sciatica, unspecified back pain laterality, unspecified chronicity  Lymphoma, unspecified body region, unspecified lymphoma type [facility])    Rx / DC Orders ED Discharge Orders         Ordered    HYDROcodone-acetaminophen (NORCO/VICODIN) 5-325 MG tablet  Every 6 hours PRN     03/10/20 1538           Lucrezia Starch, MD 03/10/20 1544

## 2020-03-10 NOTE — ED Triage Notes (Signed)
L low back pain for several weeks. Denies injury. States he has been seen multiple times for this.

## 2020-03-10 NOTE — Discharge Instructions (Addendum)
Continue Tylenol as discussed.  Take Norco as needed for breakthrough pain.  Note this medicine can make you drowsy and should not be taken while driving or operating heavy machinery.  Recommend starting with only 1/2 tablet (2.5 mg).  Can go up to 1 tablet (5 mg) if tolerating well and not having too many side effects.  Return to ER if you have worsening pain, vomiting, fever, abdominal pain.  Follow-up with cancer doctors next week as discussed.  Future Appointments  Date Time Provider [place]  03/14/2020  3:15 PM [facility] None  03/14/2020  4:00 PM Ennever, Rudell Cobb, MD [facility] None  04/04/2020 10:00 AM Dannielle Karvonen, RN [facility] None

## 2020-03-13 ENCOUNTER — Other Ambulatory Visit: Payer: Self-pay

## 2020-03-13 ENCOUNTER — Other Ambulatory Visit: Payer: Self-pay | Admitting: *Deleted

## 2020-03-13 DIAGNOSIS — C8243 Follicular lymphoma grade IIIb, intra-abdominal lymph nodes: Secondary | ICD-10-CM

## 2020-03-13 DIAGNOSIS — M79602 Pain in left arm: Secondary | ICD-10-CM | POA: Diagnosis not present

## 2020-03-13 DIAGNOSIS — M7989 Other specified soft tissue disorders: Secondary | ICD-10-CM | POA: Diagnosis not present

## 2020-03-13 DIAGNOSIS — D500 Iron deficiency anemia secondary to blood loss (chronic): Secondary | ICD-10-CM

## 2020-03-13 NOTE — Patient Outreach (Signed)
[facility]) Care Management Chronic Special Needs Program  03/13/2020  Name: Kenneth Lyons DOB: 21-Oct-1940  MRN: [id number]  Mr. Olden Klauer is enrolled in a chronic special needs plan for Diabetes. HIPAA verified with client's wife, Jakayden Cancio.  Wife states client has had increasing pain that is not being helped by prescribed pain medication. She reports client was in the ED x 2 within the past month due to back pain. Wife reports client's back pain is due to recent diagnosis of lymphoma of lymph nodes in abdomen. Wife states client has followed up with oncologist, Dr. Jonette Eva and they are waiting for treatment plan.  Wife reports client is scheduled to see his primary care provider today and Dr. Jonette Eva again on tomorrow. Wife reports she provides client's transportation to appointments. Wife states client is taking his medications as prescribed. She reports clients fasting blood sugar today 101. She reports client has had 2 hypoglycemic blood sugar readings within the past week of 36 and one in the 50's without symptoms.    RNCM advised caregiver to notify clients doctor at today's visit of low blood sugar readings. Wife verbalized understanding and agreement.   Reviewed and updated care plan.   Goals Addressed            This Visit's Progress   . caregiver will report client expresses decrease pain within 3 months as evidenced by an established treatment plan for his lymphoma and pain management   On track    Advised to keep follow up visit with primary care provider and oncologist.  Take medications as prescribed.  Notify your doctor if you have questions/ concerns.     . Caregiver will report client follow up with doctor for treatment plan regarding lymphoma diagnosis within 3 months.   On track    Follow up with doctor as recommended.  Contact doctor if you have questions/ concerns.     . Caregiver will report notifying primary care provider of client's low  blood sugar readings (36 and 50's) within 3 months.   On track    RN case manager advised caregiver to notify provider of clients low blood sugar readings at appointment scheduled on 03/13/20.  Rule of 15 reviewed with caregiver:  STEP  1:  Take 15 grams of carbohydrates when your blood sugar is low, which includes:   3-4 glucose tabs or  3-4 oz of juice or regular soda or  One tube of glucose gel STEP 2:  Recheck blood sugar in 15 minutes STEP 3:  If your blood sugar is still low at the 15 minute recheck ---then, go back to STEP 1 and treat again with another 15 grams of carbohydrates Assured caregiver aware to call 911 for severe hypoglycemic symptoms.    . Client understands the importance of follow-up with providers by attending scheduled visits   On track    Most recent primary care provider visit: 02/24/20, 02/14/20, 12/13/19 Continue to maintain and keep follow up visits with your providers.     . Client will not report change from baseline and no repeated symptoms of stroke with in the next 3 months   On track    RN case manager reviewed stroke symptoms with caregiver for client Caregiver report no repeated stroke like symptoms.  RN case manager will send education article to client: Signs of stroke    . Client will report no fall or injuries in the next 3 months.   On track  Caregiver reports no client falls RN case manager will send education article: Preventing falls in older adults    . Client will verbalize knowledge of self management of Hypertension as evidences by BP reading of 140/90 or less; or as defined by provider   On track    Reviewed blood pressure targets: Blood pressure reading from 03/09/20 visit with primary care provider 130/ 38 RN case manager will send client education article: High blood pressure: What you can do.     . Client/Caregiver will verbalize understanding of instructions related to self-care and safety   On track    Caregiver reports no client falls.   Continue to follow up with your gerontologist and neurologist regarding your dementia Review preventing falls article sent by your assigned RN care manager.       Marland Kitchen HEMOGLOBIN A1C < 7.0       Most recent Hgb A1c:  12/13/19 7.0,  08/12/19  7.0 Discussed client's recent hypoglycemic events with caregiver. Advised caregiver to notify clients doctor of recent hypoglycemic events.  RN case manager will send client eduction article: Low Blood sugar, Adult education  Re-Diabetes self management actions:  Glucose monitoring per provider recommendations  Perform Quality checks on blood meter  Eat Healthy  Check feet daily  Visit provider every 3-6 months as directed  Hbg A1C level every 3-6 months.  Eye Exam yearly    . Maintain timely refills of diabetic medication as prescribed within the year .   On track    Per chart review and caregiver report client maintains timely refills of diabetic medication.  Contact your assigned RN case manager if you are unable to obtain your medications Contact your doctor if you have questions regarding your medications.      . Obtain annual  Lipid Profile, LDL-C   On track    Most recent lipid profile 12/13/19 Try to avoid saturated fats, trans-fats and eat more fiber. RN case manager will send client education article: Heart healthy diet.  Goal renewed 2021     . Obtain Annual Eye (retinal)  Exam    On track    Your last documented eye exam was 08/11/19 Diabetes can affect your vision.  Plan to have a dilated eye exam every year.  Advised to keep and/ or schedule appointment with eye doctor.     . Obtain Annual Foot Exam   On track    Per primary care provider office most recent foot exam 02/24/20 Diabetes foot care - Check feet daily at home (look for skin color changes, cuts, sores or cracks in the skin, swelling of feet or ankles, ingrown or fungal toenails, corn or calluses). Report these findings to your doctor - Wash feet with soap and  water, dry feet well especially between toes - Moisturize your feet but not between the toes - Always wear shoes that protect your whole feet.       . Obtain annual screen for micro albuminuria (urine) , nephropathy (kidney problems)   On track    Confirmed with primary care provider office most recent microalbuminuria 03/03/15 Diabetes can affect your kidneys. It is important for your doctor to check your urine at least once a year.  These tests show how your kidney's are working.      Illa Level Hemoglobin A1C at least 2 times per year   On track    Hgb A1C done  08/12/19 with result of 7.0% and on 12/13/19 results 7.0% Re- Discussed diabetes  self management actions:  Glucose monitoring per provider recommendation  Visit provider every 3-6 months as directed  Hbg A1C level every 3-6 months.  Carbohydrate controlled meal planning  Taking diabetes medication as prescribed by provider  Physical activity  Goal renewed 2021     . Visit Primary Care Provider or Endocrinologist at least 2 times per year    On track    Most recent primary care provider visit: 02/24/20, 02/14/20, 12/13/19 Continue to maintain and keep follow up visits with your providers.  Goal renewed 2021     RNCM advised client wife to notify MD of any changes in condition prior to scheduled appointment. Caregiver advised to contact RNCM as needed and contact their HTA concierge for benefit questions.  RNCM provided client 24 hour HTA nurse advise line number [phone]  [facility] verified client/caregiver aware of 911 services for urgent/ emergent needs.  Plan:  Send successful outreach letter with a copy of their individualized care plan, Send individual care plan to provider and Send educational material  Chronic care management coordinator will outreach in:  3 Months      Quinn Plowman RN,BSN,CCM [facility] [phone]    .

## 2020-03-14 ENCOUNTER — Other Ambulatory Visit: Payer: Self-pay

## 2020-03-14 ENCOUNTER — Inpatient Hospital Stay: Payer: HMO

## 2020-03-14 ENCOUNTER — Inpatient Hospital Stay (HOSPITAL_BASED_OUTPATIENT_CLINIC_OR_DEPARTMENT_OTHER): Payer: HMO | Admitting: Hematology & Oncology

## 2020-03-14 ENCOUNTER — Encounter: Payer: Self-pay | Admitting: Hematology & Oncology

## 2020-03-14 VITALS — BP 146/58 | HR 81 | Temp 96.9°F | Resp 19 | Wt 190.0 lb

## 2020-03-14 DIAGNOSIS — R262 Difficulty in walking, not elsewhere classified: Secondary | ICD-10-CM | POA: Diagnosis not present

## 2020-03-14 DIAGNOSIS — C8243 Follicular lymphoma grade IIIb, intra-abdominal lymph nodes: Secondary | ICD-10-CM

## 2020-03-14 DIAGNOSIS — M5442 Lumbago with sciatica, left side: Secondary | ICD-10-CM | POA: Diagnosis not present

## 2020-03-14 DIAGNOSIS — C778 Secondary and unspecified malignant neoplasm of lymph nodes of multiple regions: Secondary | ICD-10-CM

## 2020-03-14 DIAGNOSIS — C8203 Follicular lymphoma grade I, intra-abdominal lymph nodes: Secondary | ICD-10-CM | POA: Diagnosis not present

## 2020-03-14 DIAGNOSIS — C649 Malignant neoplasm of unspecified kidney, except renal pelvis: Secondary | ICD-10-CM

## 2020-03-14 DIAGNOSIS — R2689 Other abnormalities of gait and mobility: Secondary | ICD-10-CM | POA: Diagnosis not present

## 2020-03-14 DIAGNOSIS — C642 Malignant neoplasm of left kidney, except renal pelvis: Secondary | ICD-10-CM | POA: Diagnosis not present

## 2020-03-14 DIAGNOSIS — D630 Anemia in neoplastic disease: Secondary | ICD-10-CM

## 2020-03-14 DIAGNOSIS — R29898 Other symptoms and signs involving the musculoskeletal system: Secondary | ICD-10-CM | POA: Diagnosis not present

## 2020-03-14 DIAGNOSIS — D500 Iron deficiency anemia secondary to blood loss (chronic): Secondary | ICD-10-CM

## 2020-03-14 HISTORY — DX: Malignant neoplasm of left kidney, except renal pelvis: C64.2

## 2020-03-14 HISTORY — DX: Malignant neoplasm of unspecified kidney, except renal pelvis: C64.9

## 2020-03-14 HISTORY — DX: Hypercalcemia: E83.52

## 2020-03-14 HISTORY — DX: Secondary and unspecified malignant neoplasm of lymph nodes of multiple regions: C77.8

## 2020-03-14 LAB — CMP (CANCER CENTER ONLY)
ALT: 11 U/L (ref 0–44)
AST: 17 U/L (ref 15–41)
Albumin: 2.7 g/dL — ABNORMAL LOW (ref 3.5–5.0)
Alkaline Phosphatase: 49 U/L (ref 38–126)
Anion gap: 12 (ref 5–15)
BUN: 30 mg/dL — ABNORMAL HIGH (ref 8–23)
CO2: 26 mmol/L (ref 22–32)
Calcium: 9.5 mg/dL (ref 8.9–10.3)
Chloride: 103 mmol/L (ref 98–111)
Creatinine: 1.18 mg/dL (ref 0.61–1.24)
GFR, Est AFR Am: >60 mL/min (ref >60)
GFR, Estimated: 59 mL/min — ABNORMAL LOW (ref >60)
Glucose, Bld: 138 mg/dL — ABNORMAL HIGH (ref 70–99)
Potassium: 4.5 mmol/L (ref 3.5–5.1)
Sodium: 141 mmol/L (ref 135–145)
Total Bilirubin: 0.4 mg/dL (ref 0.3–1.2)
Total Protein: 7.1 g/dL (ref 6.5–8.1)

## 2020-03-14 LAB — CBC WITH DIFFERENTIAL (CANCER CENTER ONLY)
Abs Immature Granulocytes: 0.05 10*3/uL (ref 0.00–0.07)
Basophils Absolute: 0 10*3/uL (ref 0.0–0.1)
Basophils Relative: 0 %
Eosinophils Absolute: 0.1 10*3/uL (ref 0.0–0.5)
Eosinophils Relative: 1 %
HCT: 24.8 % — ABNORMAL LOW (ref 39.0–52.0)
Hemoglobin: 7.4 g/dL — ABNORMAL LOW (ref 13.0–17.0)
Immature Granulocytes: 1 %
Lymphocytes Relative: 7 %
Lymphs Abs: 0.6 10*3/uL — ABNORMAL LOW (ref 0.7–4.0)
MCH: 23.6 pg — ABNORMAL LOW (ref 26.0–34.0)
MCHC: 29.8 g/dL — ABNORMAL LOW (ref 30.0–36.0)
MCV: 79 fL — ABNORMAL LOW (ref 80.0–100.0)
Monocytes Absolute: 0.6 10*3/uL (ref 0.1–1.0)
Monocytes Relative: 7 %
Neutro Abs: 6.9 10*3/uL (ref 1.7–7.7)
Neutrophils Relative %: 84 %
Platelet Count: 484 10*3/uL — ABNORMAL HIGH (ref 150–400)
RBC: 3.14 MIL/uL — ABNORMAL LOW (ref 4.22–5.81)
RDW: 18 % — ABNORMAL HIGH (ref 11.5–15.5)
WBC Count: 8.3 10*3/uL (ref 4.0–10.5)
nRBC: 0 % (ref 0.0–0.2)

## 2020-03-14 LAB — SAMPLE TO BLOOD BANK

## 2020-03-14 MED ORDER — HYDROCODONE-ACETAMINOPHEN 7.5-325 MG PO TABS: 1.0000 | ORAL_TABLET | Freq: Four times a day (QID) | ORAL | 0 refills | Status: DC | PRN

## 2020-03-14 MED ORDER — FENTANYL 25 MCG/HR TD PT72: 1.0000 | MEDICATED_PATCH | TRANSDERMAL | 0 refills | Status: DC

## 2020-03-14 MED ORDER — CABOMETYX 40 MG PO TABS: 40.0000 mg | ORAL_TABLET | Freq: Every day | ORAL | 6 refills | Status: DC

## 2020-03-14 NOTE — Progress Notes (Signed)
Hematology and Oncology Follow Up Visit  Rashod Gougeon [id number] 03/28/1941 79 y.o. 03/14/2020   Principle Diagnosis:   Metastatic renal cell carcinoma of the left kidney with multiple lymph node metastasis  Anemia secondary to renal cell carcinoma  Iron deficiency anemia secondary to malabsorption  Hypercalcemia secondary to renal cell carcinoma  Current Therapy:    Pembrolizumab/Cabometyx --start cycle 1 on 03/21/2020  Xgeva 120 mg subcu every 3 months -first dose on 03/21/2020  IV iron as indicated-dose given on 03/16/2020     Interim History:  Mr. Felkins is back for follow-up.  Shockingly, we have a totally different problem than I thought we would have.  When we first saw him, the studies seem to suggest that he was going to have lymphoma.  He had a PET scan done.  The PET scan was done on 03/06/2020.  This showed multiple lymph node involvement.  The radiologist even thought that this malignancy was going to be a lymphoma.  He had a lymph node biopsy in the left supraclavicular region last week.  This was done on 03/08/2020.  I knew something was wrong since we had not heard a result.  I spoke with the pathologist today.  She says this is NOT lymphoma but a carcinoma.  She believes this is likely a renal cell carcinoma.  Looking at his scans, he does have an enlargement of the left kidney.  He has extensive retroperitoneal lymphadenopathy.  This certainly would go along with renal cell carcinoma.  Of note, he does have marked anemia.  He still is anemic.  We saw him a couple weeks ago, his ferritin was 14 with an iron saturation of only 3%.  We had to give him blood.  We gave him some IV iron.  I suspect that he has anemia secondary to the renal cell carcinoma.  Of note his erythropoietin level is 62.  He is still having quite a bit of pain.  I started him on a Duragesic patch at 12 mcg.  This is really not helping.  I can understand why he has the pain because of the  retroperitoneal adenopathy.  He now has swelling in the left arm.  He underwent a Doppler of the left arm on 03/13/2020.  There is no evidence of thromboembolic disease in the left arm.  I have to believe that the left supraclavicular adenopathy is the source of his arm swelling.  He has swelling of his left leg.  Again I believe that the retroperitoneal adenopathy is a source of his swelling.  I also think that his anemia is contributing.  We are going to increase the Duragesic patch to 25 mcg.  I will also put him on some Vicodin (7.5/325) to try to help with breakthrough pain.  Were going to have to get treatment started on him.  I am going to have to try to get him on immunotherapy along with targeted therapy.  I think we need to try to get a relatively quick response.  I think with combined immunotherapy, the response might be a little bit prolonged.  However we can use immunotherapy with a TKI inhibitor, we might build to get a more rapid response.  I think that the combination of pembrolizumab with the Cabometyx would not be a bad idea.  I do not think he could handle full dose Cabometyx.  I would probably give him 40 mg dose that is daily.  He will clearly need to have a Port-A-Cath to be  placed.  His hemoglobin is only 7.4 today.  We are going to have to give him some more blood.  We are going to have to also give him some iron.  His albumin is quite low.  His wife has started giving him some protein supplements.  I think this will help.  Overall, I would have to say his performance status is probably ECOG 2.  Medications:  Current Outpatient Medications:  .  alfuzosin (UROXATRAL) 10 MG 24 hr tablet, TAKE 1 TABLET BY MOUTH ONCE DAILY, Disp: , Rfl:  .  amLODipine (NORVASC) 10 MG tablet, Take 10 mg by mouth daily., Disp: , Rfl:  .  aspirin EC 81 MG tablet, Take by mouth., Disp: , Rfl:  .  [START ON 03/21/2020] cabozantinib (CABOMETYX) 40 MG tablet, Take 1 tablet (40 mg total) by mouth  daily. Take on an empty stomach, 1 hour before or 2 hours after meals., Disp: 30 tablet, Rfl: 6 .  Calcium-Magnesium-Vitamin D (CALCIUM 1200+D3 PO), Take 1 tablet by mouth daily., Disp: , Rfl:  .  carvedilol (COREG) 25 MG tablet, TAKE 1 TABLET BY MOUTH TWICE DAILY WITH MEALS, Disp: , Rfl:  .  cetirizine (ZYRTEC) 10 MG tablet, Take 10 mg by mouth daily., Disp: , Rfl:  .  Cholecalciferol (VITAMIN D-1000 MAX ST) 25 MCG (1000 UT) tablet, Take 1 capsule by mouth daily., Disp: , Rfl:  .  clopidogrel (PLAVIX) 75 MG tablet, TAKE 1 TABLET BY MOUTH ONCE DAILY, Disp: , Rfl:  .  Cyanocobalamin 5000 MCG/ML LIQD, Place one dose under the tongue daily, Disp: , Rfl:  .  Dulaglutide (TRULICITY) 3.14 HF/0.2OV SOPN, Inject into the skin., Disp: , Rfl:  .  escitalopram (LEXAPRO) 10 MG tablet, Take by mouth., Disp: , Rfl:  .  fentaNYL (DURAGESIC) 25 MCG/HR, Place 1 patch onto the skin every 3 (three) days., Disp: 10 patch, Rfl: 0 .  furosemide (LASIX) 20 MG tablet, Take by mouth., Disp: , Rfl:  .  gabapentin (NEURONTIN) 100 MG capsule, Take 100 mg by mouth at bedtime as needed., Disp: , Rfl:  .  glipiZIDE (GLUCOTROL) 10 MG tablet, Take 10 mg by mouth 2 (two) times daily before a meal., Disp: , Rfl:  .  Glucosamine-Chondroitin 250-200 MG TABS, Take 1 tablet by mouth 2 (two) times daily., Disp: , Rfl:  .  hydrochlorothiazide (HYDRODIURIL) 25 MG tablet, TAKE 1 TABLET BY MOUTH ONCE DAILY, Disp: , Rfl:  .  HYDROcodone-acetaminophen (NORCO) 7.5-325 MG tablet, Take 1 tablet by mouth every 6 (six) hours as needed for moderate pain., Disp: 60 tablet, Rfl: 0 .  lisinopril (PRINIVIL,ZESTRIL) 40 MG tablet, Take by mouth., Disp: , Rfl:  .  memantine (NAMENDA) 10 MG tablet, TAKE 1 TABLET BY MOUTH TWICE DAILY, Disp: , Rfl:  .  metFORMIN (GLUCOPHAGE) 1000 MG tablet, TAKE 1 TABLET BY MOUTH TWICE DAILY WITH MEALS, Disp: , Rfl:  .  Multiple Vitamin (MULTIVITAMIN) tablet, Take 1 tablet by mouth daily., Disp: , Rfl:  .  Omega-3 Fatty  Acids (FISH OIL PO), Take 2 capsules by mouth daily., Disp: , Rfl:  .  rosuvastatin (CRESTOR) 20 MG tablet, Take 20 mg by mouth daily., Disp: , Rfl:  .  selenium 50 MCG TABS, Take 50 mcg by mouth daily., Disp: , Rfl:   Allergies: No Known Allergies  Past Medical History, Surgical history, Social history, and Family History were reviewed and updated.  Review of Systems: Review of Systems  Constitutional: Positive for appetite change, fatigue and  unexpected weight change.  HENT:  Negative.   Eyes: Negative.   Respiratory: Positive for shortness of breath.   Cardiovascular: Positive for leg swelling.  Gastrointestinal: Positive for diarrhea and nausea.  Endocrine: Negative.   Genitourinary: Negative.    Musculoskeletal: Positive for back pain and neck pain.  Skin: Negative.   Neurological: Negative.   Hematological: Negative.   Psychiatric/Behavioral: Negative.     Physical Exam:  weight is 190 lb (86.2 kg). His temporal temperature is 96.9 F (36.1 C) (abnormal). His blood pressure is 146/58 (abnormal) and his pulse is 81. His respiration is 19 and oxygen saturation is 98%.   Wt Readings from Last 3 Encounters:  03/14/20 190 lb (86.2 kg)  03/10/20 180 lb (81.6 kg)  02/28/20 181 lb (82.1 kg)    Physical Exam Vitals reviewed.  Constitutional:      Comments: This is a pale appearing elderly looking white male.  He is is in no obvious distress.  HENT:     Head: Normocephalic and atraumatic.  Eyes:     Pupils: Pupils are equal, round, and reactive to light.  Cardiovascular:     Rate and Rhythm: Normal rate and regular rhythm.     Heart sounds: Normal heart sounds.     Comments: Cardiac exam shows a regular rate and rhythm.  He has a 1/6 systolic ejection murmur. Pulmonary:     Effort: Pulmonary effort is normal.     Breath sounds: Normal breath sounds.  Abdominal:     General: Bowel sounds are normal.     Palpations: Abdomen is soft.  Musculoskeletal:        General: No  tenderness or deformity. Normal range of motion.     Cervical back: Normal range of motion.     Comments: His extremities shows swelling of the left arm and left leg.  This is pitting edema in both upper and lower extremities.  He has some weakness in both arms and legs.  He has decent range of motion.  He has decent pulses in his distal extremities.  Lymphadenopathy:     Cervical: No cervical adenopathy.  Skin:    General: Skin is warm and dry.     Findings: No erythema or rash.  Neurological:     Mental Status: He is alert and oriented to person, place, and time.  Psychiatric:        Behavior: Behavior normal.        Thought Content: Thought content normal.        Judgment: Judgment normal.      Lab Results  Component Value Date   WBC 8.3 03/14/2020   HGB 7.4 (L) 03/14/2020   HCT 24.8 (L) 03/14/2020   MCV 79.0 (L) 03/14/2020   PLT 484 (H) 03/14/2020     Chemistry      Component Value Date/Time   NA 141 03/14/2020 1517   K 4.5 03/14/2020 1517   CL 103 03/14/2020 1517   CO2 26 03/14/2020 1517   BUN 30 (H) 03/14/2020 1517   CREATININE 1.18 03/14/2020 1517      Component Value Date/Time   CALCIUM 9.5 03/14/2020 1517   ALKPHOS 49 03/14/2020 1517   AST 17 03/14/2020 1517   ALT 11 03/14/2020 1517   BILITOT 0.4 03/14/2020 1517      Impression and Plan: Mr. Navarra is a very nice 79 year old white male.  Shockingly, looks like he has metastatic renal cell carcinoma.  This is quite surprising to me.  However, we at least have very good treatment options for this.  Right now, he is going to clearly need to have a transfusion again.  His hemoglobin is only 7.4.  I think this is a contributing to the swelling that he has.  I think most of the swelling in the arm is from the left supraclavicular adenopathy.  He does have some fullness in the left supraclavicular region.  I am surprised his albumin is so low.  I think that this is an ominous factor for [place].  I think his calcium  is on the higher side when corrected for his low albumin.  I think he has, at best, intermediate prognosis renal cell carcinoma.  I forgot to mention that the pathologist will call me in a day or so to confirm that this is a renal cell carcinoma.  Once we get the diagnosis, we will send the tumor off for molecular markers to see what type of genetic mutations we have.  I spent about an hour with Mr. Birchard and his wife.  They are very very nice.  They asked a lot of good questions.  I try to go over the recommendations that I have.  He will have his transfusion on 03/16/2020.  Of course, he is on Plavix so we are to stop the Plavix.  I told him to stop the Plavix on 03/15/2020.  By the time he has a Port-A-Cath put in next week, this should be safe.  We will try to get started with treatment next week.  I do think we have to try to get a relatively quick response to help with his pain.  The retroperitoneal adenopathy clearly is causing him problems.  This is going to be somewhat tenuous with treatment.  Again, he does not have the best of performance status is but I know that he has good support from his wife.  We will get him back to see [place] for his second cycle of treatment in late June.   Volanda Napoleon, MD 5/26/20215:30 PM

## 2020-03-14 NOTE — Progress Notes (Signed)
START ON PATHWAY REGIMEN - Renal Cell     A cycle is every 21 days:     Axitinib      Pembrolizumab   **Always confirm dose/schedule in your pharmacy ordering system**  Patient Characteristics: Stage IV/Metastatic Disease, Clear Cell, First Line, Intermediate or Poor Risk Therapeutic Status: Stage IV/Metastatic Disease Histology: Clear Cell Line of Therapy: First Line Risk Status: Intermediate Risk Intent of Therapy: Non-Curative / Palliative Intent, Discussed with Patient 

## 2020-03-15 ENCOUNTER — Other Ambulatory Visit: Payer: Self-pay

## 2020-03-15 ENCOUNTER — Telehealth: Payer: Self-pay | Admitting: Pharmacy Technician

## 2020-03-15 ENCOUNTER — Other Ambulatory Visit: Payer: Self-pay | Admitting: Family

## 2020-03-15 ENCOUNTER — Encounter: Payer: Self-pay | Admitting: *Deleted

## 2020-03-15 ENCOUNTER — Telehealth: Payer: Self-pay | Admitting: Pharmacist

## 2020-03-15 ENCOUNTER — Other Ambulatory Visit: Payer: Self-pay | Admitting: *Deleted

## 2020-03-15 DIAGNOSIS — D649 Anemia, unspecified: Secondary | ICD-10-CM

## 2020-03-15 DIAGNOSIS — C649 Malignant neoplasm of unspecified kidney, except renal pelvis: Secondary | ICD-10-CM

## 2020-03-15 DIAGNOSIS — C778 Secondary and unspecified malignant neoplasm of lymph nodes of multiple regions: Secondary | ICD-10-CM

## 2020-03-15 DIAGNOSIS — C8203 Follicular lymphoma grade I, intra-abdominal lymph nodes: Secondary | ICD-10-CM

## 2020-03-15 LAB — SURGICAL PATHOLOGY

## 2020-03-15 LAB — PREPARE RBC (CROSSMATCH)

## 2020-03-15 NOTE — Telephone Encounter (Signed)
Oral Oncology Patient Advocate Encounter  Received notification from [employer] that prior authorization for Cabometyx is required.  PA submitted on CoverMyMeds Key [id number] Status is pending  Oral Oncology Clinic will continue to follow.  [place] Patient [place] Phone [phone] Fax [phone] 03/15/2020 9:21 AM

## 2020-03-15 NOTE — Telephone Encounter (Signed)
Oral Oncology Patient Advocate Encounter  Prior Authorization for Cabometyx has been approved.    PA# [id number] Effective dates: 03/15/2020 through 03/15/2021  Patients co-pay is $2246.47.  Oral Oncology Clinic will continue to follow.   [place] Patient [place] Phone [phone] Fax [phone] 03/15/2020 9:24 AM

## 2020-03-15 NOTE — Telephone Encounter (Signed)
Oral Oncology Pharmacist Encounter  Received new prescription for Cabometyx (cabozantinib) for the treatment of metastatic RCC in conjunction with pembrolizumab, planned duration until disease progression or unacceptable drug toxicity.  CMP from 03/14/20 assessed, no relevant lab abnormalities. Prescription dose and frequency assessed.   Current medication list in Epic reviewed, no DDIs with cabozantinib identified.  Prescription has been e-scribed to the [facility] for benefits analysis and approval.  Oral Oncology Clinic will continue to follow for insurance authorization, copayment issues, initial counseling and start date.  Darl Pikes, PharmD, BCPS, BCOP, CPP Hematology/Oncology Clinical Pharmacist Practitioner [facility] [phone]  03/15/2020 10:13 AM

## 2020-03-15 NOTE — Progress Notes (Signed)
After follow up visit with Dr Marin Olp patient has plan to start chemo next Thursday. He will need port, chemo ed and chemo scheduled. He is also scheduled to receive 2 units of PRBCs on 03/16/2020.  Port scheduled for 03/20/2020. Patient's wife given all the following information:  NPO after midnight. Hold all diabetic medications. Can take other medications with sips of water. Continue to hold plavix.  Arrive at 7:30am. [facility] main entrance. They have valet parking if needed. Go to admissions on the left. Need a driver.   Patient's wife also given appointment dates, times and locations for treatment and chemo education on 03/22/2020.

## 2020-03-16 ENCOUNTER — Other Ambulatory Visit: Payer: Self-pay

## 2020-03-16 ENCOUNTER — Other Ambulatory Visit: Payer: Self-pay | Admitting: Radiology

## 2020-03-16 ENCOUNTER — Inpatient Hospital Stay (HOSPITAL_COMMUNITY)
Admission: EM | Admit: 2020-03-16 | Discharge: 2020-03-20 | DRG: 853 | Disposition: A | Discharge status: Home Health | Payer: HMO | Attending: Internal Medicine | Admitting: Internal Medicine

## 2020-03-16 ENCOUNTER — Other Ambulatory Visit: Payer: Self-pay | Admitting: Student

## 2020-03-16 ENCOUNTER — Inpatient Hospital Stay: Payer: HMO

## 2020-03-16 ENCOUNTER — Emergency Department (HOSPITAL_COMMUNITY): Payer: HMO

## 2020-03-16 DIAGNOSIS — I10 Essential (primary) hypertension: Secondary | ICD-10-CM | POA: Diagnosis not present

## 2020-03-16 DIAGNOSIS — C8203 Follicular lymphoma grade I, intra-abdominal lymph nodes: Secondary | ICD-10-CM

## 2020-03-16 DIAGNOSIS — Z20822 Contact with and (suspected) exposure to covid-19: Secondary | ICD-10-CM | POA: Diagnosis present

## 2020-03-16 DIAGNOSIS — J80 Acute respiratory distress syndrome: Secondary | ICD-10-CM | POA: Diagnosis not present

## 2020-03-16 DIAGNOSIS — E1165 Type 2 diabetes mellitus with hyperglycemia: Secondary | ICD-10-CM | POA: Diagnosis present

## 2020-03-16 DIAGNOSIS — D630 Anemia in neoplastic disease: Secondary | ICD-10-CM | POA: Diagnosis present

## 2020-03-16 DIAGNOSIS — J9601 Acute respiratory failure with hypoxia: Secondary | ICD-10-CM | POA: Diagnosis not present

## 2020-03-16 DIAGNOSIS — D509 Iron deficiency anemia, unspecified: Secondary | ICD-10-CM | POA: Diagnosis present

## 2020-03-16 DIAGNOSIS — C779 Secondary and unspecified malignant neoplasm of lymph node, unspecified: Secondary | ICD-10-CM | POA: Diagnosis present

## 2020-03-16 DIAGNOSIS — C649 Malignant neoplasm of unspecified kidney, except renal pelvis: Secondary | ICD-10-CM | POA: Diagnosis present

## 2020-03-16 DIAGNOSIS — A419 Sepsis, unspecified organism: Principal | ICD-10-CM | POA: Diagnosis present

## 2020-03-16 DIAGNOSIS — C7900 Secondary malignant neoplasm of unspecified kidney and renal pelvis: Secondary | ICD-10-CM | POA: Diagnosis present

## 2020-03-16 DIAGNOSIS — Z7984 Long term (current) use of oral hypoglycemic drugs: Secondary | ICD-10-CM

## 2020-03-16 DIAGNOSIS — Z7982 Long term (current) use of aspirin: Secondary | ICD-10-CM

## 2020-03-16 DIAGNOSIS — I255 Ischemic cardiomyopathy: Secondary | ICD-10-CM | POA: Diagnosis present

## 2020-03-16 DIAGNOSIS — R739 Hyperglycemia, unspecified: Secondary | ICD-10-CM

## 2020-03-16 DIAGNOSIS — F0280 Dementia in other diseases classified elsewhere without behavioral disturbance: Secondary | ICD-10-CM | POA: Diagnosis present

## 2020-03-16 DIAGNOSIS — J189 Pneumonia, unspecified organism: Secondary | ICD-10-CM | POA: Diagnosis present

## 2020-03-16 DIAGNOSIS — R918 Other nonspecific abnormal finding of lung field: Secondary | ICD-10-CM

## 2020-03-16 DIAGNOSIS — L89151 Pressure ulcer of sacral region, stage 1: Secondary | ICD-10-CM | POA: Diagnosis present

## 2020-03-16 DIAGNOSIS — Z950 Presence of cardiac pacemaker: Secondary | ICD-10-CM

## 2020-03-16 DIAGNOSIS — E1169 Type 2 diabetes mellitus with other specified complication: Secondary | ICD-10-CM | POA: Diagnosis present

## 2020-03-16 DIAGNOSIS — Z8673 Personal history of transient ischemic attack (TIA), and cerebral infarction without residual deficits: Secondary | ICD-10-CM

## 2020-03-16 DIAGNOSIS — I442 Atrioventricular block, complete: Secondary | ICD-10-CM | POA: Diagnosis present

## 2020-03-16 DIAGNOSIS — R0902 Hypoxemia: Secondary | ICD-10-CM | POA: Diagnosis not present

## 2020-03-16 DIAGNOSIS — I5033 Acute on chronic diastolic (congestive) heart failure: Secondary | ICD-10-CM | POA: Diagnosis present

## 2020-03-16 DIAGNOSIS — I110 Hypertensive heart disease with heart failure: Secondary | ICD-10-CM | POA: Diagnosis present

## 2020-03-16 DIAGNOSIS — I214 Non-ST elevation (NSTEMI) myocardial infarction: Secondary | ICD-10-CM | POA: Diagnosis present

## 2020-03-16 DIAGNOSIS — R0689 Other abnormalities of breathing: Secondary | ICD-10-CM | POA: Diagnosis not present

## 2020-03-16 DIAGNOSIS — Z66 Do not resuscitate: Secondary | ICD-10-CM | POA: Diagnosis present

## 2020-03-16 DIAGNOSIS — Z7902 Long term (current) use of antithrombotics/antiplatelets: Secondary | ICD-10-CM

## 2020-03-16 DIAGNOSIS — Z79899 Other long term (current) drug therapy: Secondary | ICD-10-CM

## 2020-03-16 DIAGNOSIS — G301 Alzheimer's disease with late onset: Secondary | ICD-10-CM | POA: Diagnosis present

## 2020-03-16 DIAGNOSIS — R0602 Shortness of breath: Secondary | ICD-10-CM | POA: Diagnosis not present

## 2020-03-16 DIAGNOSIS — Z9079 Acquired absence of other genital organ(s): Secondary | ICD-10-CM

## 2020-03-16 DIAGNOSIS — E119 Type 2 diabetes mellitus without complications: Secondary | ICD-10-CM

## 2020-03-16 DIAGNOSIS — G894 Chronic pain syndrome: Secondary | ICD-10-CM | POA: Diagnosis present

## 2020-03-16 DIAGNOSIS — C642 Malignant neoplasm of left kidney, except renal pelvis: Secondary | ICD-10-CM | POA: Diagnosis present

## 2020-03-16 DIAGNOSIS — E782 Mixed hyperlipidemia: Secondary | ICD-10-CM | POA: Diagnosis present

## 2020-03-16 DIAGNOSIS — L8990 Pressure ulcer of unspecified site, unspecified stage: Secondary | ICD-10-CM | POA: Insufficient documentation

## 2020-03-16 DIAGNOSIS — E1129 Type 2 diabetes mellitus with other diabetic kidney complication: Secondary | ICD-10-CM

## 2020-03-16 DIAGNOSIS — R809 Proteinuria, unspecified: Secondary | ICD-10-CM

## 2020-03-16 DIAGNOSIS — Z8546 Personal history of malignant neoplasm of prostate: Secondary | ICD-10-CM

## 2020-03-16 DIAGNOSIS — I5023 Acute on chronic systolic (congestive) heart failure: Secondary | ICD-10-CM

## 2020-03-16 DIAGNOSIS — I5043 Acute on chronic combined systolic (congestive) and diastolic (congestive) heart failure: Secondary | ICD-10-CM | POA: Diagnosis present

## 2020-03-16 DIAGNOSIS — Z87891 Personal history of nicotine dependence: Secondary | ICD-10-CM

## 2020-03-16 DIAGNOSIS — R404 Transient alteration of awareness: Secondary | ICD-10-CM | POA: Diagnosis not present

## 2020-03-16 DIAGNOSIS — K219 Gastro-esophageal reflux disease without esophagitis: Secondary | ICD-10-CM | POA: Diagnosis present

## 2020-03-16 LAB — CBC WITH DIFFERENTIAL/PLATELET
Abs Immature Granulocytes: 0.13 10*3/uL — ABNORMAL HIGH (ref 0.00–0.07)
Basophils Absolute: 0.1 10*3/uL (ref 0.0–0.1)
Basophils Relative: 1 %
Eosinophils Absolute: 0.3 10*3/uL (ref 0.0–0.5)
Eosinophils Relative: 2 %
HCT: 32.7 % — ABNORMAL LOW (ref 39.0–52.0)
Hemoglobin: 10.1 g/dL — ABNORMAL LOW (ref 13.0–17.0)
Immature Granulocytes: 1 %
Lymphocytes Relative: 8 %
Lymphs Abs: 1.1 10*3/uL (ref 0.7–4.0)
MCH: 24.7 pg — ABNORMAL LOW (ref 26.0–34.0)
MCHC: 30.9 g/dL (ref 30.0–36.0)
MCV: 80 fL (ref 80.0–100.0)
Monocytes Absolute: 0.9 10*3/uL (ref 0.1–1.0)
Monocytes Relative: 7 %
Neutro Abs: 10.9 10*3/uL — ABNORMAL HIGH (ref 1.7–7.7)
Neutrophils Relative %: 81 %
Platelets: 654 10*3/uL — ABNORMAL HIGH (ref 150–400)
RBC: 4.09 MIL/uL — ABNORMAL LOW (ref 4.22–5.81)
RDW: 17.4 % — ABNORMAL HIGH (ref 11.5–15.5)
WBC: 13.3 10*3/uL — ABNORMAL HIGH (ref 4.0–10.5)
nRBC: 0.3 % — ABNORMAL HIGH (ref 0.0–0.2)

## 2020-03-16 LAB — BASIC METABOLIC PANEL
Anion gap: 12 (ref 5–15)
BUN: 36 mg/dL — ABNORMAL HIGH (ref 8–23)
CO2: 26 mmol/L (ref 22–32)
Calcium: 9.7 mg/dL (ref 8.9–10.3)
Chloride: 103 mmol/L (ref 98–111)
Creatinine, Ser: 1.25 mg/dL — ABNORMAL HIGH (ref 0.61–1.24)
GFR calc Af Amer: >60 mL/min (ref >60)
GFR calc non Af Amer: 55 mL/min — ABNORMAL LOW (ref >60)
Glucose, Bld: 302 mg/dL — ABNORMAL HIGH (ref 70–99)
Potassium: 5 mmol/L (ref 3.5–5.1)
Sodium: 141 mmol/L (ref 135–145)

## 2020-03-16 MED ORDER — FUROSEMIDE 10 MG/ML IJ SOLN
80.0000 mg | Freq: Once | INTRAMUSCULAR | Status: AC
  Administered 2020-03-17: 80 mg via INTRAVENOUS
  Filled 2020-03-16: qty 8

## 2020-03-16 MED ORDER — HEPARIN SOD (PORK) LOCK FLUSH 100 UNIT/ML IV SOLN
500.0000 [IU] | Freq: Every day | INTRAVENOUS | Status: DC | PRN
  Filled 2020-03-16: qty 5

## 2020-03-16 MED ORDER — SODIUM CHLORIDE 0.9% FLUSH
10.0000 mL | INTRAVENOUS | Status: DC | PRN
  Filled 2020-03-16: qty 10

## 2020-03-16 MED ORDER — FUROSEMIDE 10 MG/ML IJ SOLN
20.0000 mg | Freq: Once | INTRAMUSCULAR | Status: AC
  Administered 2020-03-16: 20 mg via INTRAVENOUS

## 2020-03-16 MED ORDER — SODIUM CHLORIDE 0.9% IV SOLUTION
250.0000 mL | Freq: Once | INTRAVENOUS | Status: AC
  Administered 2020-03-16: 250 mL via INTRAVENOUS
  Filled 2020-03-16: qty 250

## 2020-03-16 MED ORDER — FUROSEMIDE 10 MG/ML IJ SOLN
INTRAMUSCULAR | Status: AC
  Filled 2020-03-16: qty 4

## 2020-03-16 MED ORDER — SODIUM CHLORIDE 0.9% FLUSH
3.0000 mL | INTRAVENOUS | Status: DC | PRN
  Filled 2020-03-16: qty 10

## 2020-03-16 MED ORDER — HEPARIN SOD (PORK) LOCK FLUSH 100 UNIT/ML IV SOLN
250.0000 [IU] | INTRAVENOUS | Status: DC | PRN
  Filled 2020-03-16: qty 5

## 2020-03-16 MED ORDER — NITROGLYCERIN IN D5W 200-5 MCG/ML-% IV SOLN: 0.0000 ug/min | INTRAVENOUS | Status: DC

## 2020-03-16 NOTE — ED Triage Notes (Signed)
Pt arrived from home w/ [facility]. Upon arrival by EMS pt found to be at 64% SpO2. Pt placed on Bipap by ems and SpO2 increased to 88%. Per family pt received a blood transfusion today from [facility] Pt given 1 nitro in the field. MD at bedside now. Pt able to answer basic questions at this time.

## 2020-03-16 NOTE — Patient Instructions (Signed)

## 2020-03-16 NOTE — Progress Notes (Signed)
Pharmacist Chemotherapy Monitoring - Initial Assessment    Anticipated start date: 03/22/20  Regimen:  . Are orders appropriate based on the patient's diagnosis, regimen, and cycle? Yes . Does the plan date match the patient's scheduled date? Yes . Is the sequencing of drugs appropriate? Yes . Are the premedications appropriate for the patient's regimen? Yes . Prior Authorization for treatment is: Approved o If applicable, is the correct biosimilar selected based on the patient's insurance? not applicable  Organ Function and Labs: Marland Kitchen Are dose adjustments needed based on the patient's renal function, hepatic function, or hematologic function? No . Are appropriate labs ordered prior to the start of patient's treatment? Yes . Other organ system assessment, if indicated: N/A . The following baseline labs, if indicated, have been ordered: pembrolizumab: baseline TSH +/- T4  Dose Assessment: . Are the drug doses appropriate? Yes . Are the following correct: o Drug concentrations Yes o IV fluid compatible with drug Yes o Administration routes Yes o Timing of therapy Yes . If applicable, does the patient have documented access for treatment and/or plans for port-a-cath placement? yes . If applicable, have lifetime cumulative doses been properly documented and assessed? not applicable Lifetime Dose Tracking  No doses have been documented on this patient for the following tracked chemicals: Doxorubicin, Epirubicin, Idarubicin, Daunorubicin, Mitoxantrone, Bleomycin, Oxaliplatin, Carboplatin, Liposomal Doxorubicin  o   Toxicity Monitoring/Prevention: . The patient has the following take home antiemetics prescribed: N/A . The patient has the following take home medications prescribed: N/A . Medication allergies and previous infusion related reactions, if applicable, have been reviewed and addressed. Yes  . The patient's current medication list has been assessed for drug-drug interactions with  their chemotherapy regimen. no significant drug-drug interactions were identified on review.  Order Review: . Are the treatment plan orders signed? Yes . Is the patient scheduled to see a provider prior to their treatment? No  I verify that I have reviewed each item in the above checklist and answered each question accordingly.  Niamh Rada, Jacqlyn Larsen 03/16/2020 1:44 PM

## 2020-03-16 NOTE — ED Provider Notes (Signed)
MSE was initiated and I personally evaluated the patient and placed orders (if any) at  10:56 PM on Mar 16, 2020.  The patient appears stable so that the remainder of the MSE may be completed by another provider.  Patient presents in respiratory distress.  He has a history of recently diagnosed metastatic renal cell carcinoma, diabetes, hypertension.  He reportedly got a blood transfusion earlier today for some chronic anemia.  He was due to start chemotherapy next week.  He was found to be short of breath at home today.  EMS noted oxygen saturations to be 64% on room air.  He was started on BiPAP and his sats increased to 88%.  He also got 1 nitroglycerin by EMS.  He is not complaining of any chest pain.  He states that shortness of breath started earlier today.   Malvin Johns, MD 03/16/20 2258

## 2020-03-17 ENCOUNTER — Other Ambulatory Visit (HOSPITAL_COMMUNITY): Payer: Self-pay

## 2020-03-17 ENCOUNTER — Other Ambulatory Visit: Payer: Self-pay

## 2020-03-17 ENCOUNTER — Inpatient Hospital Stay (HOSPITAL_COMMUNITY): Payer: HMO

## 2020-03-17 ENCOUNTER — Encounter (HOSPITAL_COMMUNITY): Payer: Self-pay | Admitting: Internal Medicine

## 2020-03-17 DIAGNOSIS — R6520 Severe sepsis without septic shock: Secondary | ICD-10-CM

## 2020-03-17 DIAGNOSIS — Z66 Do not resuscitate: Secondary | ICD-10-CM | POA: Diagnosis not present

## 2020-03-17 DIAGNOSIS — I255 Ischemic cardiomyopathy: Secondary | ICD-10-CM | POA: Diagnosis not present

## 2020-03-17 DIAGNOSIS — A419 Sepsis, unspecified organism: Secondary | ICD-10-CM | POA: Diagnosis present

## 2020-03-17 DIAGNOSIS — I214 Non-ST elevation (NSTEMI) myocardial infarction: Secondary | ICD-10-CM | POA: Diagnosis not present

## 2020-03-17 DIAGNOSIS — L89151 Pressure ulcer of sacral region, stage 1: Secondary | ICD-10-CM | POA: Diagnosis not present

## 2020-03-17 DIAGNOSIS — F0280 Dementia in other diseases classified elsewhere without behavioral disturbance: Secondary | ICD-10-CM

## 2020-03-17 DIAGNOSIS — I5033 Acute on chronic diastolic (congestive) heart failure: Secondary | ICD-10-CM | POA: Diagnosis not present

## 2020-03-17 DIAGNOSIS — E782 Mixed hyperlipidemia: Secondary | ICD-10-CM

## 2020-03-17 DIAGNOSIS — E1169 Type 2 diabetes mellitus with other specified complication: Secondary | ICD-10-CM | POA: Diagnosis not present

## 2020-03-17 DIAGNOSIS — J9601 Acute respiratory failure with hypoxia: Secondary | ICD-10-CM | POA: Diagnosis not present

## 2020-03-17 DIAGNOSIS — E1129 Type 2 diabetes mellitus with other diabetic kidney complication: Secondary | ICD-10-CM

## 2020-03-17 DIAGNOSIS — R0602 Shortness of breath: Secondary | ICD-10-CM | POA: Diagnosis not present

## 2020-03-17 DIAGNOSIS — I442 Atrioventricular block, complete: Secondary | ICD-10-CM | POA: Diagnosis not present

## 2020-03-17 DIAGNOSIS — I5043 Acute on chronic combined systolic (congestive) and diastolic (congestive) heart failure: Secondary | ICD-10-CM | POA: Diagnosis not present

## 2020-03-17 DIAGNOSIS — C649 Malignant neoplasm of unspecified kidney, except renal pelvis: Secondary | ICD-10-CM | POA: Diagnosis not present

## 2020-03-17 DIAGNOSIS — R809 Proteinuria, unspecified: Secondary | ICD-10-CM | POA: Diagnosis not present

## 2020-03-17 DIAGNOSIS — R509 Fever, unspecified: Secondary | ICD-10-CM | POA: Diagnosis not present

## 2020-03-17 DIAGNOSIS — I10 Essential (primary) hypertension: Secondary | ICD-10-CM | POA: Diagnosis not present

## 2020-03-17 DIAGNOSIS — G894 Chronic pain syndrome: Secondary | ICD-10-CM | POA: Diagnosis not present

## 2020-03-17 DIAGNOSIS — Z20822 Contact with and (suspected) exposure to covid-19: Secondary | ICD-10-CM | POA: Diagnosis not present

## 2020-03-17 DIAGNOSIS — C778 Secondary and unspecified malignant neoplasm of lymph nodes of multiple regions: Secondary | ICD-10-CM

## 2020-03-17 DIAGNOSIS — D630 Anemia in neoplastic disease: Secondary | ICD-10-CM | POA: Diagnosis not present

## 2020-03-17 DIAGNOSIS — I340 Nonrheumatic mitral (valve) insufficiency: Secondary | ICD-10-CM | POA: Diagnosis not present

## 2020-03-17 DIAGNOSIS — I361 Nonrheumatic tricuspid (valve) insufficiency: Secondary | ICD-10-CM

## 2020-03-17 DIAGNOSIS — G301 Alzheimer's disease with late onset: Secondary | ICD-10-CM | POA: Diagnosis not present

## 2020-03-17 DIAGNOSIS — C7900 Secondary malignant neoplasm of unspecified kidney and renal pelvis: Secondary | ICD-10-CM | POA: Diagnosis not present

## 2020-03-17 DIAGNOSIS — I5023 Acute on chronic systolic (congestive) heart failure: Secondary | ICD-10-CM | POA: Diagnosis not present

## 2020-03-17 DIAGNOSIS — Z87891 Personal history of nicotine dependence: Secondary | ICD-10-CM | POA: Diagnosis not present

## 2020-03-17 DIAGNOSIS — C642 Malignant neoplasm of left kidney, except renal pelvis: Secondary | ICD-10-CM | POA: Diagnosis not present

## 2020-03-17 DIAGNOSIS — D509 Iron deficiency anemia, unspecified: Secondary | ICD-10-CM | POA: Diagnosis not present

## 2020-03-17 DIAGNOSIS — C779 Secondary and unspecified malignant neoplasm of lymph node, unspecified: Secondary | ICD-10-CM | POA: Diagnosis not present

## 2020-03-17 DIAGNOSIS — J189 Pneumonia, unspecified organism: Secondary | ICD-10-CM | POA: Diagnosis not present

## 2020-03-17 DIAGNOSIS — I350 Nonrheumatic aortic (valve) stenosis: Secondary | ICD-10-CM | POA: Diagnosis not present

## 2020-03-17 DIAGNOSIS — K219 Gastro-esophageal reflux disease without esophagitis: Secondary | ICD-10-CM | POA: Diagnosis not present

## 2020-03-17 DIAGNOSIS — Z452 Encounter for adjustment and management of vascular access device: Secondary | ICD-10-CM | POA: Diagnosis not present

## 2020-03-17 DIAGNOSIS — E1165 Type 2 diabetes mellitus with hyperglycemia: Secondary | ICD-10-CM | POA: Diagnosis not present

## 2020-03-17 DIAGNOSIS — R918 Other nonspecific abnormal finding of lung field: Secondary | ICD-10-CM | POA: Diagnosis not present

## 2020-03-17 DIAGNOSIS — I110 Hypertensive heart disease with heart failure: Secondary | ICD-10-CM | POA: Diagnosis not present

## 2020-03-17 LAB — CBC WITH DIFFERENTIAL/PLATELET
Abs Immature Granulocytes: 0.06 10*3/uL (ref 0.00–0.07)
Basophils Absolute: 0 10*3/uL (ref 0.0–0.1)
Basophils Relative: 0 %
Eosinophils Absolute: 0 10*3/uL (ref 0.0–0.5)
Eosinophils Relative: 0 %
HCT: 30.9 % — ABNORMAL LOW (ref 39.0–52.0)
Hemoglobin: 9.7 g/dL — ABNORMAL LOW (ref 13.0–17.0)
Immature Granulocytes: 1 %
Lymphocytes Relative: 5 %
Lymphs Abs: 0.5 10*3/uL — ABNORMAL LOW (ref 0.7–4.0)
MCH: 24.9 pg — ABNORMAL LOW (ref 26.0–34.0)
MCHC: 31.4 g/dL (ref 30.0–36.0)
MCV: 79.4 fL — ABNORMAL LOW (ref 80.0–100.0)
Monocytes Absolute: 0.6 10*3/uL (ref 0.1–1.0)
Monocytes Relative: 6 %
Neutro Abs: 8.8 10*3/uL — ABNORMAL HIGH (ref 1.7–7.7)
Neutrophils Relative %: 88 %
Platelets: 540 10*3/uL — ABNORMAL HIGH (ref 150–400)
RBC: 3.89 MIL/uL — ABNORMAL LOW (ref 4.22–5.81)
RDW: 17.7 % — ABNORMAL HIGH (ref 11.5–15.5)
WBC: 10 10*3/uL (ref 4.0–10.5)
nRBC: 0.2 % (ref 0.0–0.2)

## 2020-03-17 LAB — LACTIC ACID, PLASMA
Lactic Acid, Venous: 1.1 mmol/L (ref 0.5–1.9)
Lactic Acid, Venous: 1.1 mmol/L (ref 0.5–1.9)

## 2020-03-17 LAB — SARS CORONAVIRUS 2 BY RT PCR (HOSPITAL ORDER, PERFORMED IN ~~LOC~~ HOSPITAL LAB): SARS Coronavirus 2: NEGATIVE

## 2020-03-17 LAB — BRAIN NATRIURETIC PEPTIDE: B Natriuretic Peptide: 512.5 pg/mL — ABNORMAL HIGH (ref 0.0–100.0)

## 2020-03-17 LAB — POCT I-STAT 7, (LYTES, BLD GAS, ICA,H+H)
Acid-Base Excess: 3 mmol/L — ABNORMAL HIGH (ref 0.0–2.0)
Bicarbonate: 27.6 mmol/L (ref 20.0–28.0)
Calcium, Ion: 1.35 mmol/L (ref 1.15–1.40)
HCT: 34 % — ABNORMAL LOW (ref 39.0–52.0)
Hemoglobin: 11.6 g/dL — ABNORMAL LOW (ref 13.0–17.0)
O2 Saturation: 99 %
Patient temperature: 100.5
Potassium: 3.6 mmol/L (ref 3.5–5.1)
Sodium: 140 mmol/L (ref 135–145)
TCO2: 29 mmol/L (ref 22–32)
pCO2 arterial: 42.7 mmHg (ref 32.0–48.0)
pH, Arterial: 7.423 (ref 7.350–7.450)
pO2, Arterial: 154 mmHg — ABNORMAL HIGH (ref 83.0–108.0)

## 2020-03-17 LAB — TROPONIN I (HIGH SENSITIVITY)
Troponin I (High Sensitivity): 1194 ng/L (ref <18)
Troponin I (High Sensitivity): 1559 ng/L (ref <18)
Troponin I (High Sensitivity): 1657 ng/L (ref <18)
Troponin I (High Sensitivity): 1493 ng/L (ref <18)

## 2020-03-17 LAB — PROCALCITONIN: Procalcitonin: 3.55 ng/mL

## 2020-03-17 LAB — COMPREHENSIVE METABOLIC PANEL
ALT: 18 U/L (ref 0–44)
AST: 24 U/L (ref 15–41)
Albumin: 2.5 g/dL — ABNORMAL LOW (ref 3.5–5.0)
Alkaline Phosphatase: 50 U/L (ref 38–126)
Anion gap: 13 (ref 5–15)
BUN: 35 mg/dL — ABNORMAL HIGH (ref 8–23)
CO2: 26 mmol/L (ref 22–32)
Calcium: 9.5 mg/dL (ref 8.9–10.3)
Chloride: 104 mmol/L (ref 98–111)
Creatinine, Ser: 1.13 mg/dL (ref 0.61–1.24)
GFR calc Af Amer: >60 mL/min (ref >60)
GFR calc non Af Amer: >60 mL/min (ref >60)
Glucose, Bld: 234 mg/dL — ABNORMAL HIGH (ref 70–99)
Potassium: 3.4 mmol/L — ABNORMAL LOW (ref 3.5–5.1)
Sodium: 143 mmol/L (ref 135–145)
Total Bilirubin: 0.4 mg/dL (ref 0.3–1.2)
Total Protein: 6.5 g/dL (ref 6.5–8.1)

## 2020-03-17 LAB — IRON AND TIBC
Iron: 16 ug/dL — ABNORMAL LOW (ref 45–182)
Saturation Ratios: 5 % — ABNORMAL LOW (ref 17.9–39.5)
TIBC: 321 ug/dL (ref 250–450)
UIBC: 305 ug/dL

## 2020-03-17 LAB — HEMOGLOBIN A1C
Hgb A1c MFr Bld: 6.5 % — ABNORMAL HIGH (ref 4.8–5.6)
Mean Plasma Glucose: 139.85 mg/dL

## 2020-03-17 LAB — GLUCOSE, CAPILLARY
Glucose-Capillary: 240 mg/dL — ABNORMAL HIGH (ref 70–99)
Glucose-Capillary: 253 mg/dL — ABNORMAL HIGH (ref 70–99)
Glucose-Capillary: 142 mg/dL — ABNORMAL HIGH (ref 70–99)
Glucose-Capillary: 208 mg/dL — ABNORMAL HIGH (ref 70–99)

## 2020-03-17 LAB — VITAMIN B12: Vitamin B-12: 1633 pg/mL — ABNORMAL HIGH (ref 180–914)

## 2020-03-17 LAB — MRSA PCR SCREENING: MRSA by PCR: NEGATIVE

## 2020-03-17 LAB — ECHOCARDIOGRAM COMPLETE
Height: 67 in
Weight: 3026.47 oz

## 2020-03-17 LAB — FOLATE: Folate: 28.5 ng/mL (ref >5.9)

## 2020-03-17 LAB — C-REACTIVE PROTEIN: CRP: 19.8 mg/dL — ABNORMAL HIGH (ref <1.0)

## 2020-03-17 LAB — MAGNESIUM: Magnesium: 2 mg/dL (ref 1.7–2.4)

## 2020-03-17 LAB — APTT: aPTT: 74 seconds — ABNORMAL HIGH (ref 24–36)

## 2020-03-17 LAB — PROTIME-INR
INR: 1.4 — ABNORMAL HIGH (ref 0.8–1.2)
Prothrombin Time: 16.4 seconds — ABNORMAL HIGH (ref 11.4–15.2)

## 2020-03-17 LAB — HEPARIN LEVEL (UNFRACTIONATED): Heparin Unfractionated: <0.1 IU/mL — ABNORMAL LOW (ref 0.30–0.70)

## 2020-03-17 MED ORDER — ASPIRIN 325 MG PO TABS: 325.0000 mg | ORAL_TABLET | Freq: Every day | ORAL | Status: DC

## 2020-03-17 MED ORDER — ASPIRIN EC 81 MG PO TBEC
81.0000 mg | DELAYED_RELEASE_TABLET | Freq: Every day | ORAL | Status: DC
  Administered 2020-03-17 – 2020-03-18 (×2): 81 mg via ORAL
  Filled 2020-03-17 (×2): qty 1

## 2020-03-17 MED ORDER — ACETAMINOPHEN 325 MG PO TABS
650.0000 mg | ORAL_TABLET | Freq: Once | ORAL | Status: AC
  Administered 2020-03-17: 650 mg via ORAL
  Filled 2020-03-17: qty 2

## 2020-03-17 MED ORDER — SODIUM CHLORIDE 0.9 % IV SOLN
2.0000 g | Freq: Two times a day (BID) | INTRAVENOUS | Status: DC
  Administered 2020-03-17 – 2020-03-20 (×6): 2 g via INTRAVENOUS
  Filled 2020-03-17 (×8): qty 2

## 2020-03-17 MED ORDER — ACETAMINOPHEN 650 MG RE SUPP
650.0000 mg | Freq: Four times a day (QID) | RECTAL | Status: DC | PRN
  Filled 2020-03-17: qty 1

## 2020-03-17 MED ORDER — HEPARIN (PORCINE) 25000 UT/250ML-% IV SOLN
1600.0000 [IU]/h | INTRAVENOUS | Status: DC
  Administered 2020-03-17: 1100 [IU]/h via INTRAVENOUS
  Filled 2020-03-17 (×2): qty 250

## 2020-03-17 MED ORDER — ONDANSETRON HCL 4 MG/2ML IJ SOLN: 4.0000 mg | Freq: Four times a day (QID) | INTRAMUSCULAR | Status: DC | PRN

## 2020-03-17 MED ORDER — AMLODIPINE BESYLATE 10 MG PO TABS
10.0000 mg | ORAL_TABLET | Freq: Every day | ORAL | Status: DC
  Administered 2020-03-17: 10 mg via ORAL
  Filled 2020-03-17: qty 1

## 2020-03-17 MED ORDER — CARVEDILOL 25 MG PO TABS
25.0000 mg | ORAL_TABLET | Freq: Two times a day (BID) | ORAL | Status: DC
  Administered 2020-03-17 – 2020-03-20 (×7): 25 mg via ORAL
  Filled 2020-03-17 (×7): qty 1

## 2020-03-17 MED ORDER — ALFUZOSIN HCL ER 10 MG PO TB24
10.0000 mg | ORAL_TABLET | Freq: Every day | ORAL | Status: DC
  Administered 2020-03-17 – 2020-03-20 (×4): 10 mg via ORAL
  Filled 2020-03-17 (×4): qty 1

## 2020-03-17 MED ORDER — MEMANTINE HCL 10 MG PO TABS
10.0000 mg | ORAL_TABLET | Freq: Two times a day (BID) | ORAL | Status: DC
  Administered 2020-03-17 – 2020-03-20 (×7): 10 mg via ORAL
  Filled 2020-03-17 (×8): qty 1

## 2020-03-17 MED ORDER — ESCITALOPRAM OXALATE 10 MG PO TABS
10.0000 mg | ORAL_TABLET | Freq: Every day | ORAL | Status: DC
  Administered 2020-03-17 – 2020-03-20 (×4): 10 mg via ORAL
  Filled 2020-03-17 (×4): qty 1

## 2020-03-17 MED ORDER — INSULIN ASPART 100 UNIT/ML ~~LOC~~ SOLN
0.0000 [IU] | Freq: Three times a day (TID) | SUBCUTANEOUS | Status: DC
  Administered 2020-03-17: 8 [IU] via SUBCUTANEOUS
  Administered 2020-03-17: 5 [IU] via SUBCUTANEOUS
  Administered 2020-03-17 – 2020-03-18 (×2): 2 [IU] via SUBCUTANEOUS
  Administered 2020-03-18: 5 [IU] via SUBCUTANEOUS
  Administered 2020-03-18 (×2): 3 [IU] via SUBCUTANEOUS
  Administered 2020-03-19: 5 [IU] via SUBCUTANEOUS
  Administered 2020-03-19 (×3): 3 [IU] via SUBCUTANEOUS
  Administered 2020-03-20: 5 [IU] via SUBCUTANEOUS
  Administered 2020-03-20: 2 [IU] via SUBCUTANEOUS

## 2020-03-17 MED ORDER — FUROSEMIDE 10 MG/ML IJ SOLN
60.0000 mg | Freq: Two times a day (BID) | INTRAMUSCULAR | Status: AC
  Administered 2020-03-17 (×2): 60 mg via INTRAVENOUS
  Filled 2020-03-17 (×2): qty 6

## 2020-03-17 MED ORDER — FUROSEMIDE 10 MG/ML IJ SOLN
40.0000 mg | Freq: Once | INTRAMUSCULAR | Status: AC
  Administered 2020-03-17: 40 mg via INTRAVENOUS
  Filled 2020-03-17: qty 4

## 2020-03-17 MED ORDER — VANCOMYCIN HCL 1500 MG/300ML IV SOLN
1500.0000 mg | Freq: Once | INTRAVENOUS | Status: AC
  Administered 2020-03-17: 1500 mg via INTRAVENOUS
  Filled 2020-03-17 (×2): qty 300

## 2020-03-17 MED ORDER — POLYETHYLENE GLYCOL 3350 17 G PO PACK: 17.0000 g | PACK | Freq: Every day | ORAL | Status: DC | PRN

## 2020-03-17 MED ORDER — SODIUM CHLORIDE 0.9 % IV SOLN
500.0000 mg | Freq: Once | INTRAVENOUS | Status: AC
  Administered 2020-03-17: 500 mg via INTRAVENOUS
  Filled 2020-03-17: qty 500

## 2020-03-17 MED ORDER — LISINOPRIL 20 MG PO TABS
40.0000 mg | ORAL_TABLET | Freq: Every day | ORAL | Status: DC
  Administered 2020-03-17 – 2020-03-19 (×3): 40 mg via ORAL
  Filled 2020-03-17 (×3): qty 2

## 2020-03-17 MED ORDER — ACETAMINOPHEN 325 MG PO TABS: 650.0000 mg | ORAL_TABLET | Freq: Four times a day (QID) | ORAL | Status: DC | PRN

## 2020-03-17 MED ORDER — SODIUM CHLORIDE 0.9 % IV SOLN
1.0000 g | Freq: Once | INTRAVENOUS | Status: AC
  Administered 2020-03-17: 1 g via INTRAVENOUS
  Filled 2020-03-17: qty 10

## 2020-03-17 MED ORDER — PANTOPRAZOLE SODIUM 40 MG IV SOLR
40.0000 mg | Freq: Every day | INTRAVENOUS | Status: DC
  Administered 2020-03-17 – 2020-03-20 (×4): 40 mg via INTRAVENOUS
  Filled 2020-03-17 (×4): qty 40

## 2020-03-17 MED ORDER — PERFLUTREN LIPID MICROSPHERE
1.0000 mL | INTRAVENOUS | Status: AC | PRN
  Administered 2020-03-17: 1 mL via INTRAVENOUS
  Filled 2020-03-17: qty 10

## 2020-03-17 MED ORDER — ONDANSETRON HCL 4 MG PO TABS: 4.0000 mg | ORAL_TABLET | Freq: Four times a day (QID) | ORAL | Status: DC | PRN

## 2020-03-17 MED ORDER — HEPARIN BOLUS VIA INFUSION
4000.0000 [IU] | Freq: Once | INTRAVENOUS | Status: AC
  Administered 2020-03-17: 4000 [IU] via INTRAVENOUS
  Filled 2020-03-17: qty 4000

## 2020-03-17 MED ORDER — HYDROCODONE-ACETAMINOPHEN 7.5-325 MG PO TABS
1.0000 | ORAL_TABLET | Freq: Four times a day (QID) | ORAL | Status: DC | PRN
  Administered 2020-03-19: 1 via ORAL
  Filled 2020-03-17: qty 1

## 2020-03-17 MED ORDER — VANCOMYCIN HCL 1250 MG/250ML IV SOLN
1250.0000 mg | INTRAVENOUS | Status: DC
  Administered 2020-03-18: 1250 mg via INTRAVENOUS
  Filled 2020-03-17: qty 250

## 2020-03-17 MED ORDER — FUROSEMIDE 10 MG/ML IJ SOLN: 80.0000 mg | Freq: Two times a day (BID) | INTRAMUSCULAR | Status: DC

## 2020-03-17 MED ORDER — CLOPIDOGREL BISULFATE 75 MG PO TABS
75.0000 mg | ORAL_TABLET | Freq: Every day | ORAL | Status: DC
  Administered 2020-03-17 – 2020-03-18 (×2): 75 mg via ORAL
  Filled 2020-03-17 (×3): qty 1

## 2020-03-17 MED ORDER — ROSUVASTATIN CALCIUM 20 MG PO TABS
20.0000 mg | ORAL_TABLET | Freq: Every day | ORAL | Status: DC
  Administered 2020-03-17 – 2020-03-19 (×3): 20 mg via ORAL
  Filled 2020-03-17 (×2): qty 1

## 2020-03-17 NOTE — ED Provider Notes (Signed)
Vitals appropriate, patient no distress. Troponin is over thousand, but no EKG changes Awaiting admission   Ripley Fraise, MD 03/17/20 [phone]

## 2020-03-17 NOTE — Progress Notes (Signed)
ANTICOAGULATION and ANTIBIOTIC CONSULT NOTE  Pharmacy Consult for heparin Indication: ACS and PNA  No Known Allergies  Patient Measurements: Height: 5\' 7"  (170.2 cm) Weight: 85.8 kg (189 lb 2.5 oz) IBW/kg (Calculated) : 66.1 Heparin Dosing Weight: 80kg  Vital Signs: Temp: 97.7 F (36.5 C) (05/29 1111) Temp Source: Oral (05/29 1111) BP: 131/70 (05/29 1111) Pulse Rate: 75 (05/29 1111)  Labs: Recent Labs    03/14/20 1517 03/14/20 1517 03/16/20 2253 03/16/20 2253 03/17/20 0137 03/17/20 0217 03/17/20 0417 03/17/20 0757 03/17/20 1416  HGB 7.4*  --  10.1*   < > 11.6*  --   --  9.7*  --   HCT 24.8*   < > 32.7*  --  34.0*  --   --  30.9*  --   PLT 484*  --  654*  --   --   --   --  540*  --   APTT  --   --   --   --   --   --   --  74*  --   LABPROT  --   --   --   --   --   --   --  16.4*  --   INR  --   --   --   --   --   --   --  1.4*  --   HEPARINUNFRC  --   --   --   --   --   --   --   --  <0.10*  CREATININE 1.18  --  1.25*  --   --   --   --  1.13  --   TROPONINIHS  --   --   --   --   --  1,194* 1,559*  --   --    < > = values in this interval not displayed.    Estimated Creatinine Clearance: 57.3 mL/min (by C-G formula based on SCr of 1.13 mg/dL).   Medical History: Past Medical History:  Diagnosis Date  . Alzheimer's dementia ([place])   . Anemia associated with left renal cell cancer treated with erythropoietin ([place]) 03/14/2020  . Cancer ([place])   . Diabetes mellitus   . GERD (gastroesophageal reflux disease)   . Goals of care, counseling/discussion 02/28/2020  . High cholesterol   . Hypercalcemia of malignancy 03/14/2020  . Hypertension   . Iron deficiency anemia due to chronic blood loss 02/29/2020  . Kidney cancer, primary, with metastasis from kidney to other site, left ([place]) 03/14/2020  . Lymphoma ([place])   . 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 Malignant neoplasm of kidney metastatic to lymph nodes of multiple regions ([place]) 03/14/2020  .  Stroke [facility])     Assessment: 79yo male c/o SOB with elevated troponins, cardiology planning to medically manage. Heparin level undetectable, no infusion issues or S/Sx bleeding per RN.  Goal of Therapy:  Heparin level 0.3-0.7 units/ml  Monitor platelets by anticoagulation protocol: Yes   Plan:  -Increase heparin to 1400 units/h -Recheck heparin level in [place], PharmD, BCPS Clinical Pharmacist [phone] Please check AMION for all [place] numbers 03/17/2020

## 2020-03-17 NOTE — Progress Notes (Addendum)
PROGRESS NOTE        PATIENT DETAILS Name: Kenneth Lyons Age: 79 y.o. Sex: male Date of Birth: 05/19/1941 Admit Date: 03/16/2020 Admitting Physician [name], MD TAV:[id number], Barbarann Ehlers, DO  Brief Narrative: Patient is a 79 y.o. male with recent diagnosis of metastatic renal cell carcinoma, anemia related to underlying cancer-who was given 2 units of PRBC at the outpatient infusion center-following which patient developed acute respiratory distress-was brought to the emergency room-found to be hypoxemic requiring BiPAP support.  Given intravenous Lasix-clinically improved-and was titrated to nasal cannula.  See below for further details.  Significant events: 5/28>> admit to [facility] for severe hypoxemia requiring BiPAP after 2 units of PRBC transfusion.  Antimicrobial therapy: Vancomycin: 5/28>> Cefepime : 5/28>>  Microbiology data: 5/28: Blood culture>> pending  Procedures : None  Consults: Cards  DVT Prophylaxis : Full dose anticoagulation with Heparin  Subjective: [name]-able to answer simple questions.  Titrated to room air this morning.  Denies any chest pain to me.  Assessment/Plan: Acute hypoxic respiratory failure: Suspect secondary to decompensated diastolic heart failure-he has 2+ pitting edema on exam.  Improved with diuresis-clinically does not appear to have PNA-await procalcitonin level-if within normal limits-suspect can discontinue all antibiotics.  Overall clinical scenario not consistent with sepsis physiology.  Continue with intravenous diuresis-follow electrolytes/weights/volume status closely.  Addendum: Procalcitonin elevated-continue IV antibiotics.  Elevated troponin-likely demand ischemia secondary to hypoxemia/severe anemia: Continue IV heparin/aspirin/Plavix/beta-blocker/statin-await echo-await further input from cardiology.  Metastatic renal cell carcinoma: Continue outpatient follow-up with Dr. Marin Olp   Anemia: Appears to be microcytic-Per outpatient oncology note-related to underlying malignancy.  Patient was transfused 2 units of PRBC at the outpatient infusion center on 5/28.  Follow hemoglobin closely-no overt history of hematochezia/melena.  History of CVA: [name]-history of Alzheimer's-but appears nonfocal.  Continue aspirin/Plavix/statin  History of complete heart block: PPM in place-telemetry monitoring  HTN: BP controlled-continue amlodipine, alfuzosin, Coreg, and lisinopril  DM 2: Follow CBGs-continue SSI  Recent Labs    03/17/20 0647  GLUCAP 240*   HLD: Continue statin  History of Alzheimer's dementia: [name]-suspect not far from usual baseline.  Namenda  Diet: Diet Order            Diet heart healthy/carb modified Room service appropriate? Yes; Fluid consistency: Thin  Diet effective now              Code Status: DNR  Family Communication: Spouse over the [phone] on 5/29  Disposition Plan: Status is: Inpatient  Remains inpatient appropriate because:Inpatient level of care appropriate due to severity of illness   Dispo: The patient is from: Home              Anticipated d/c is to: Home              Anticipated d/c date is: 2 days              Patient currently is not medically stable to d/c.  Barriers to Discharge: Respiratory failure with hypoxia-ruling out pneumonia-with volume overload requiring IV Lasix.  Significantly elevated troponin-awaiting formal cardiology evaluation.  Antimicrobial agents: Anti-infectives (From admission, onward)   Start     Dose/Rate Route Frequency Ordered Stop   03/18/20 0400  vancomycin (VANCOREADY) IVPB 1250 mg/250 mL     1,250 mg 166.7 mL/hr over 90 Minutes Intravenous Every 24 hours 03/17/20 0529  03/17/20 0600  ceFEPIme (MAXIPIME) 2 g in sodium chloride 0.9 % 100 mL IVPB     2 g 200 mL/hr over 30 Minutes Intravenous Every 12 hours 03/17/20 0529     03/17/20 0130   cefTRIAXone (ROCEPHIN) 1 g in sodium chloride 0.9 % 100 mL IVPB     1 g 200 mL/hr over 30 Minutes Intravenous  Once 03/17/20 0121 03/17/20 0238   03/17/20 0130  azithromycin (ZITHROMAX) 500 mg in sodium chloride 0.9 % 250 mL IVPB     500 mg 250 mL/hr over 60 Minutes Intravenous  Once 03/17/20 0121 03/17/20 0345   03/17/20 0130  vancomycin (VANCOREADY) IVPB 1500 mg/300 mL     1,500 mg 150 mL/hr over 120 Minutes Intravenous  Once 03/17/20 0124 03/17/20 0552       Time spent: 35 minutes-Greater than 50% of this time was spent in counseling, explanation of diagnosis, planning of further management, and coordination of care.  MEDICATIONS: Scheduled Meds: . alfuzosin  10 mg Oral Daily  . amLODipine  10 mg Oral Daily  . aspirin EC  81 mg Oral Daily  . carvedilol  25 mg Oral BID WC  . clopidogrel  75 mg Oral Daily  . escitalopram  10 mg Oral Daily  . insulin aspart  0-15 Units Subcutaneous TID AC & HS  . lisinopril  40 mg Oral Daily  . memantine  10 mg Oral BID  . pantoprazole (PROTONIX) IV  40 mg Intravenous Daily  . rosuvastatin  20 mg Oral q1800   Continuous Infusions: . ceFEPime (MAXIPIME) IV    . heparin 1,100 Units/hr (03/17/20 0541)  . [START ON 03/18/2020] vancomycin     PRN Meds:.acetaminophen **OR** acetaminophen, HYDROcodone-acetaminophen, ondansetron **OR** ondansetron (ZOFRAN) IV, polyethylene glycol   PHYSICAL EXAM: Vital signs: Vitals:   03/17/20 0515 03/17/20 0530 03/17/20 0600 03/17/20 0808  BP: 115/66 132/72 (!) 145/88 124/75  Pulse: 71 72 82 88  Resp: 14 16 19 17   Temp:   97.7 F (36.5 C)   TempSrc:   Oral   SpO2: 96% 96% 99% 95%  Weight:   85.8 kg   Height:   5\' 7"  (1.702 m)    Filed Weights   03/17/20 0113 03/17/20 0600  Weight: 83.9 kg 85.8 kg   Body mass index is 29.63 kg/m.   Gen Exam: [name]-not in any distress HEENT:atraumatic, normocephalic Chest: B/L clear to auscultation anteriorly  CVS:S1S2 regular Abdomen:soft non  tender, non distended Extremities:+ edema Neurology: Nonfocal-appears to be moving all 4 extremities.  Limited exam given dementia Skin: no rash  I have personally reviewed following labs and imaging studies  LABORATORY DATA: CBC: Recent Labs  Lab 03/14/20 1517 03/16/20 2253 03/17/20 0137 03/17/20 0757  WBC 8.3 13.3*  --  10.0  NEUTROABS 6.9 10.9*  --  8.8*  HGB 7.4* 10.1* 11.6* 9.7*  HCT 24.8* 32.7* 34.0* 30.9*  MCV 79.0* 80.0  --  79.4*  PLT 484* 654*  --  540*    Basic Metabolic Panel: Recent Labs  Lab 03/14/20 1517 03/16/20 2253 03/17/20 0137  NA 141 141 140  K 4.5 5.0 3.6  CL 103 103  --   CO2 26 26  --   GLUCOSE 138* 302*  --   BUN 30* 36*  --   CREATININE 1.18 1.25*  --   CALCIUM 9.5 9.7  --     GFR: Estimated Creatinine Clearance: 51.8 mL/min (A) (by C-G formula based on SCr of 1.25 mg/dL (  H)).  Liver Function Tests: Recent Labs  Lab 03/14/20 1517  AST 17  ALT 11  ALKPHOS 49  BILITOT 0.4  PROT 7.1  ALBUMIN 2.7*   No results for input(s): LIPASE, AMYLASE in the last 168 hours. No results for input(s): AMMONIA in the last 168 hours.  Coagulation Profile: Recent Labs  Lab 03/17/20 0757  INR 1.4*    Cardiac Enzymes: No results for input(s): CKTOTAL, CKMB, CKMBINDEX, TROPONINI in the last 168 hours.  BNP (last 3 results) No results for input(s): PROBNP in the last 8760 hours.  Lipid Profile: No results for input(s): CHOL, HDL, LDLCALC, TRIG, CHOLHDL, LDLDIRECT in the last 72 hours.  Thyroid Function Tests: No results for input(s): TSH, T4TOTAL, FREET4, T3FREE, THYROIDAB in the last 72 hours.  Anemia Panel: No results for input(s): VITAMINB12, FOLATE, FERRITIN, TIBC, IRON, RETICCTPCT in the last 72 hours.  Urine analysis:    Component Value Date/Time   COLORURINE YELLOW 10/30/2019 1424   APPEARANCEUR CLEAR 10/30/2019 1424   LABSPEC 1.015 10/30/2019 1424   PHURINE 6.5 10/30/2019 1424   GLUCOSEU >=500 (A) 10/30/2019 1424   HGBUR  NEGATIVE 10/30/2019 1424   BILIRUBINUR NEGATIVE 10/30/2019 1424   KETONESUR NEGATIVE 10/30/2019 1424   PROTEINUR NEGATIVE 10/30/2019 1424   UROBILINOGEN 0.2 07/06/2012 2042   NITRITE NEGATIVE 10/30/2019 1424   LEUKOCYTESUR NEGATIVE 10/30/2019 1424    Sepsis Labs: Lactic Acid, Venous    Component Value Date/Time   LATICACIDVEN 1.1 03/17/2020 0422    MICROBIOLOGY: Recent Results (from the past 240 hour(s))  SARS Coronavirus 2 by RT PCR (hospital order, performed in [place] hospital lab) Nasopharyngeal Nasopharyngeal Swab     Status: None   Collection Time: 03/17/20 12:53 AM   Specimen: Nasopharyngeal Swab  Result Value Ref Range Status   SARS Coronavirus 2 NEGATIVE NEGATIVE Final    Comment: (NOTE) SARS-CoV-2 target nucleic acids are NOT DETECTED. The SARS-CoV-2 RNA is generally detectable in upper and lower respiratory specimens during the acute phase of infection. The lowest concentration of SARS-CoV-2 viral copies this assay can detect is 250 copies / mL. A negative result does not preclude SARS-CoV-2 infection and should not be used as the sole basis for treatment or other patient management decisions.  A negative result may occur with improper specimen collection / handling, submission of specimen other than nasopharyngeal swab, presence of viral mutation(s) within the areas targeted by this assay, and inadequate number of viral copies (<250 copies / mL). A negative result must be combined with clinical observations, patient history, and epidemiological information. Fact Sheet for Patients:   StrictlyIdeas.no Fact Sheet for Healthcare Providers: BankingDealers.co.za This test is not yet approved or cleared  by the [place] FDA and has been authorized for detection and/or diagnosis of SARS-CoV-2 by FDA under an Emergency Use Authorization (EUA).  This EUA will remain in effect (meaning this test can be used) for the  duration of the COVID-19 declaration under Section 564(b)(1) of the Act, 21 U.S.C. section 360bbb-3(b)(1), unless the authorization is terminated or revoked sooner. Performed at [facility], [place] [address], [place],  [address]     RADIOLOGY STUDIES/RESULTS: DG Chest Port 1 View  Result Date: 03/17/2020 CLINICAL DATA:  [place] EXAM: PORTABLE CHEST 1 VIEW COMPARISON:  03/16/2020 FINDINGS: Cardiac shadow is enlarged but stable. Pacing device is again seen. The patchy airspace opacity seen in the bases bilaterally have improved significantly although residual is noted primarily within the left lung base. No other focal  abnormality is noted. No bony abnormality is seen. IMPRESSION: Significant improved aeration when compared with the prior exam. Residual left basilar opacity is noted. Electronically Signed   By: Inez Catalina M.D.   On: 03/17/2020 09:16   DG Chest Port 1 View  Result Date: 03/16/2020 CLINICAL DATA:  79 year old male with shortness of breath. EXAM: PORTABLE CHEST 1 VIEW COMPARISON:  Chest radiograph dated 02/10/2020. FINDINGS: Bilateral lower lung field airspace densities concerning for developing infiltrate. Clinical correlation is recommended. Trace left pleural effusion may be present. No pneumothorax. Stable cardiomediastinal silhouette. Left pectoral pacemaker device. No acute osseous pathology. IMPRESSION: Bilateral lower lung field airspace densities concerning for developing infiltrate. Clinical correlation and follow-up recommended. Electronically Signed   By: Anner Crete M.D.   On: 03/16/2020 23:11     LOS: 0 days   Oren Binet, MD  [facility]    To contact the attending provider between 7A-7P or the covering provider during after hours 7P-7A, please log into the web site [email or web address] and access using universal [place] password for that web site. If you do not have the password, please call the hospital operator.  03/17/2020, 9:18 AM

## 2020-03-17 NOTE — Evaluation (Signed)
Physical Therapy Evaluation Patient Details Name: Kenneth Lyons MRN: [id number] DOB: February 12, 1941 Today's Date: 03/17/2020   History of Present Illness  Pt adm with acute respiratory distress and receiving 2U of PRBC. Pt required bipap.  Pt with recent diagnosis of renal cell CA. PMH - CVA, HTN, DM, Alzheimers  Clinical Impression  Pt admitted with above diagnosis and presents to PT with functional limitations due to deficits listed below (See PT problem list). Pt needs skilled PT to maximize independence and safety to allow discharge to home with wife. Recommend using rollator for amb. Pt just started OPPT with [facility] and will resume that at DC.      Follow Up Recommendations Outpatient PT(resume with his current provider)    Equipment Recommendations  None recommended by PT    Recommendations for Other Services       Precautions / Restrictions Precautions Precautions: Fall Restrictions Weight Bearing Restrictions: No      Mobility  Bed Mobility Overal bed mobility: Needs Assistance Bed Mobility: Supine to Sit     Supine to sit: Supervision;HOB elevated     General bed mobility comments: Incr time and assist for lines  Transfers Overall transfer level: Needs assistance Equipment used: None Transfers: Sit to/from Stand Sit to Stand: Min guard         General transfer comment: Assist for lines/safety  Ambulation/Gait Ambulation/Gait assistance: Min guard;Supervision Gait Distance (Feet): 250 Feet Assistive device: None;4-wheeled walker Gait Pattern/deviations: Step-through pattern;Decreased stride length Gait velocity: decr Gait velocity interpretation: 1.31 - 2.62 ft/sec, indicative of limited community ambulator General Gait Details: Without assistive device pt required min guard and with guarded gait. With rollator pt much more confident and only needed supervision for lines  Stairs            Wheelchair Mobility    Modified Rankin (Stroke Patients  Only)       Balance Overall balance assessment: Needs assistance Sitting-balance support: No upper extremity supported;Feet supported Sitting balance-Leahy Scale: Good     Standing balance support: No upper extremity supported;During functional activity Standing balance-Leahy Scale: Good                               Pertinent Vitals/Pain Pain Assessment: No/denies pain    Home Living Family/patient expects to be discharged to:: Private residence Living Arrangements: Spouse/significant other Available Help at Discharge: Family;Available 24 hours/day Type of Home: House Home Access: Stairs to enter Entrance Stairs-Rails: Right Entrance Stairs-Number of Steps: 2 Home Layout: One level Home Equipment: Walker - 4 wheels Additional Comments: rollator was mother in laws    Prior Function Level of Independence: Independent               Hand Dominance        Extremity/Trunk Assessment   Upper Extremity Assessment Upper Extremity Assessment: Defer to OT evaluation    Lower Extremity Assessment Lower Extremity Assessment: Generalized weakness       Communication   Communication: No difficulties  Cognition Arousal/Alertness: Awake/alert Behavior During Therapy: WFL for tasks assessed/performed Overall Cognitive Status: History of cognitive impairments - at baseline                                        General Comments General comments (skin integrity, edema, etc.): VSS on RA    Exercises  Assessment/Plan    PT Assessment Patient needs continued PT services  PT Problem List Decreased strength;Decreased activity tolerance;Decreased balance;Decreased mobility       PT Treatment Interventions DME instruction;Gait training;Functional mobility training;Therapeutic activities;Therapeutic exercise;Balance training;Patient/family education    PT Goals (Current goals can be found in the Care Plan section)  Acute Rehab PT  Goals Patient Stated Goal: go home PT Goal Formulation: With patient Time For Goal Achievement: 03/31/20 Potential to Achieve Goals: Good    Frequency Min 3X/week   Barriers to discharge        Co-evaluation               AM-PAC PT "6 Clicks" Mobility  Outcome Measure Help needed turning from your back to your side while in a flat bed without using bedrails?: None Help needed moving from lying on your back to sitting on the side of a flat bed without using bedrails?: None Help needed moving to and from a bed to a chair (including a wheelchair)?: A Little Help needed standing up from a chair using your arms (e.g., wheelchair or bedside chair)?: A Little Help needed to walk in hospital room?: A Little Help needed climbing 3-5 steps with a railing? : A Little 6 Click Score: 20    End of Session Equipment Utilized During Treatment: Gait belt Activity Tolerance: Patient tolerated treatment well Patient left: in chair;with call bell/phone within reach;with family/visitor present(wife instructed to let nurse know if she is leaving) Nurse Communication: Mobility status PT Visit Diagnosis: Other abnormalities of gait and mobility (R26.89);Muscle weakness (generalized) (M62.81)    Time: 1355-1410 PT Time Calculation (min) (ACUTE ONLY): 15 min   Charges:   PT Evaluation $PT Eval Low Complexity: [place] Pager [phone] Office [place] 03/17/2020, 3:14 PM

## 2020-03-17 NOTE — Progress Notes (Signed)
ANTICOAGULATION and ANTIBIOTIC CONSULT NOTE - Initial Consult  Pharmacy Consult for heparin, vancomycin, and cefepime Indication: ACS and PNA  No Known Allergies  Patient Measurements: Height: 5\' 7"  (170.2 cm) Weight: 83.9 kg (185 lb) IBW/kg (Calculated) : 66.1 Heparin Dosing Weight: 80kg  Vital Signs: Temp: 100.5 F (38.1 C) (05/29 0056) Temp Source: Rectal (05/29 0056) BP: 123/67 (05/29 0430) Pulse Rate: 73 (05/29 0430)  Labs: Recent Labs    03/14/20 1517 03/16/20 2253 03/17/20 0137 03/17/20 0217 03/17/20 0417  HGB 7.4* 10.1* 11.6*  --   --   HCT 24.8* 32.7* 34.0*  --   --   PLT 484* 654*  --   --   --   CREATININE 1.18 1.25*  --   --   --   TROPONINIHS  --   --   --  1,194* 1,559*    Estimated Creatinine Clearance: 51.2 mL/min (A) (by C-G formula based on SCr of 1.25 mg/dL (H)).   Medical History: Past Medical History:  Diagnosis Date  . Alzheimer's dementia ([place])   . Anemia associated with left renal cell cancer treated with erythropoietin ([place]) 03/14/2020  . Cancer ([place])   . Diabetes mellitus   . GERD (gastroesophageal reflux disease)   . Goals of care, counseling/discussion 02/28/2020  . High cholesterol   . Hypercalcemia of malignancy 03/14/2020  . Hypertension   . Iron deficiency anemia due to chronic blood loss 02/29/2020  . Kidney cancer, primary, with metastasis from kidney to other site, left ([place]) 03/14/2020  . Lymphoma ([place])   . 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 Stroke [facility])     Assessment: 79yo male c/o SOB, concern for transfusion-related lung injury vs volume overload vs PNA, to begin IV ABX; troponin also found to be elevated, to begin heparin.  Goal of Therapy:  Heparin level 0.3-0.7 units/ml  Vancomycin trough 15-20 Monitor platelets by anticoagulation protocol: Yes   Plan:  Will give heparin 4000 units IV bolus x1 followed  by gtt at 1100 units/hr and monitor heparin levels and CBC.  Will start vancomycin 1500mg  x1 followed by 1250mg  IV Q24H as well as cefepime 2g IV Q12H and monitor CBC, Cx, levels prn.  Wynona Neat, PharmD, BCPS  03/17/2020,5:21 AM

## 2020-03-17 NOTE — Progress Notes (Signed)
Echocardiogram 2D Echocardiogram has been performed.  [place] Deputy Tyrena Gohr 03/17/2020, 1:33 PM

## 2020-03-17 NOTE — H&P (Signed)
History and Physical    Kenneth Lyons PRF:[id number] DOB: 07-31-1941 DOA: 03/16/2020  PCP: Nicola Girt, DO  Patient coming from: Home   Chief Complaint:  Chief Complaint  Patient presents with  . Shortness of Breath     HPI:    79 year old male with past medical history of recently diagnosed stage IV metastatic renal cell carcinoma (follows with Dr. Marin Olp), hypertension, previous CVA, diabetes mellitus type 2, Alzheimer's dementia, complete heart block status post pacemaker placement, hyperlipidemia, mild aortic stenosis, diastolic congestive heart failure (last echo 06/2019 with grade 2 dysfunction, EF 60-65%) who presents to [facility] emergency department with severe shortness of breath.  Patient underwent 2 units of packed red blood cell transfusion on 5/28 for treatment of malignancy associated anemia.  This was in preparation for initiation of chemotherapy on 6/2.  Shortly after completion of the transfusion, patient began to develop shortness of breath.  Initially EMS was called but patient declined transport to the hospital.  Over the next several hours patient's shortness of breath continued to worsen and EMS once returned, bring patient into the hospital for acute respiratory distress.  In route, BiPAP was initiated on EMS transport.  Patient explains that he developed sudden onset severe shortness of breath with associated cough.  Patient denies any associated chest pain.  Does complain of bilateral lower extremity swelling but this has been ongoing for several months.  Prior to the blood transfusion, patient denies any recent fevers, travel, contact with COVID-19, new onset cough, malaise or weakness.  Upon evaluation in the emergency department, chest imaging revealed bilateral infiltrates, primarily of the lower lobes.  Patient was found to have multiple SIRS criteria including fever, leukocytosis and tachycardia and therefore broad-spectrum intravenous  antibiotic therapy was started with ceftriaxone, azithromycin and vancomycin.  80mg  IV lasix was administered.  Patient was slowly transitioned off of BiPAP therapy to nasal cannula.  Hospitalist group was then called to assess the patient for admission the hospital.   Review of Systems: A 10-system review of systems has been performed and all systems are negative with the exception of what is listed in the HPI.    Past Medical History:  Diagnosis Date  . Alzheimer's dementia ([place])   . Anemia associated with left renal cell cancer treated with erythropoietin ([place]) 03/14/2020  . Cancer ([place])   . Diabetes mellitus   . GERD (gastroesophageal reflux disease)   . Goals of care, counseling/discussion 02/28/2020  . High cholesterol   . Hypercalcemia of malignancy 03/14/2020  . Hypertension   . Iron deficiency anemia due to chronic blood loss 02/29/2020  . Kidney cancer, primary, with metastasis from kidney to other site, left ([place]) 03/14/2020  . Lymphoma ([place])   . 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 Stroke [facility])     Past Surgical History:  Procedure Laterality Date  . APPENDECTOMY    . IR US GUIDE BX ASP/DRAIN  03/08/2020  . PACEMAKER INSERTION    . PROSTATE SURGERY    . TONSILLECTOMY       reports that he has quit smoking. He has never used smokeless tobacco. He reports current alcohol use. He reports that he does not use drugs.  No Known Allergies  Family History  Problem Relation Age of Onset  . Other Neg Hx      Prior to Admission medications   Medication Sig Start Date  End Date Taking? Authorizing Provider  alfuzosin (UROXATRAL) 10 MG 24 hr tablet Take 10 mg by mouth daily with breakfast.  08/18/19  Yes [provider]  amLODipine (NORVASC) 10 MG tablet Take 10 mg by mouth daily.   Yes [provider]  aspirin EC 81 MG tablet Take 81 mg by mouth  daily.    Yes [provider]  cabozantinib (CABOMETYX) 40 MG tablet Take 1 tablet (40 mg total) by mouth daily. Take on an empty stomach, 1 hour before or 2 hours after meals. 03/21/20  Yes Volanda Napoleon, MD  Calcium-Magnesium-Vitamin D (CALCIUM 1200+D3 PO) Take 1 tablet by mouth daily. 08/31/18  Yes [provider]  carvedilol (COREG) 25 MG tablet Take 25 mg by mouth 2 (two) times daily with a meal.  01/28/18  Yes [provider]  cetirizine (ZYRTEC) 10 MG tablet Take 10 mg by mouth daily.   Yes [provider]  Cholecalciferol (VITAMIN D-1000 MAX ST) 25 MCG (1000 UT) tablet Take 1 capsule by mouth daily.   Yes [provider]  clopidogrel (PLAVIX) 75 MG tablet Take 75 mg by mouth daily.  03/20/17  Yes [provider]  Cyanocobalamin 5000 MCG SUBL Place 5,000 mcg under the tongue daily. Place one dose under the tongue daily    Yes [provider]  Dulaglutide (TRULICITY) 4.65 KC/1.2XN SOPN Inject 0.75 mg into the skin every Wednesday.    Yes [provider]  escitalopram (LEXAPRO) 10 MG tablet Take 10 mg by mouth daily.  10/04/18  Yes [provider]  fentaNYL (DURAGESIC) 25 MCG/HR Place 1 patch onto the skin every 3 (three) days. 03/14/20  Yes Ennever, Rudell Cobb, MD  furosemide (LASIX) 20 MG tablet Take 10 mg by mouth daily.  02/17/20  Yes [provider]  gabapentin (NEURONTIN) 100 MG capsule Take 100 mg by mouth at bedtime as needed (pain).  02/24/20  Yes [provider]  glipiZIDE (GLUCOTROL) 10 MG tablet Take 10 mg by mouth daily before breakfast. Take with glipizide 5 mg to equal 15 mg   Yes [provider]  glipiZIDE (GLUCOTROL) 5 MG tablet Take 5 mg by mouth daily before breakfast. Take with glipizide 10 mg to equal 15 mg   Yes [provider]  Glucosamine-Chondroitin 250-200 MG TABS Take 1 tablet by mouth 2 (two) times daily.   Yes [provider]  hydrochlorothiazide  (HYDRODIURIL) 25 MG tablet Take 12.5 mg by mouth daily.  01/27/17  Yes [provider]  HYDROcodone-acetaminophen (NORCO) 7.5-325 MG tablet Take 1 tablet by mouth every 6 (six) hours as needed for moderate pain. 03/14/20  Yes Volanda Napoleon, MD  lisinopril (PRINIVIL,ZESTRIL) 40 MG tablet Take 40 mg by mouth daily.  01/28/18  Yes [provider]  memantine (NAMENDA) 10 MG tablet Take 10 mg by mouth 2 (two) times daily.  05/12/17  Yes [provider]  metFORMIN (GLUCOPHAGE) 1000 MG tablet Take 1,000 mg by mouth 2 (two) times daily with a meal.  01/28/18  Yes [provider]  Multiple Vitamin (MULTIVITAMIN) tablet Take 1 tablet by mouth daily.   Yes [provider]  Omega-3 Fatty Acids (FISH OIL PO) Take 2 capsules by mouth daily.   Yes [provider]  rosuvastatin (CRESTOR) 20 MG tablet Take 20 mg by mouth daily. 08/17/19  Yes [provider]  selenium 50 MCG TABS Take 50 mcg by mouth daily.   Yes [provider]    Physical  Exam: Vitals:   03/17/20 0315 03/17/20 0330 03/17/20 0345 03/17/20 0430  BP: 127/67 126/70 122/64 123/67  Pulse: 80 80 78 73  Resp: 16 12 19 16   Temp:      TempSrc:      SpO2: 96% 96% 97% 98%  Weight:      Height:        Constitutional: Lethargic but arousable, oriented x3, no associated distress.   Skin: no rashes, no lesions, good skin turgor noted. Eyes: Pupils are equally reactive to light.  Increased conjunctival pallor noted without any evidence of scleral icterus. ENMT: Moist mucous membranes noted.  Posterior pharynx clear of any exudate or lesions.   Neck: normal, supple, no masses, no thyromegaly.  Notable jugular venous distention at 30 degrees. Respiratory: Coarse breath sounds bilaterally with significant bibasilar rales and scattered rhonchi.  No evidence of wheezing.  Normal respiratory effort. No accessory muscle use.  Cardiovascular: Regular rate and rhythm, no murmurs / rubs /  gallops.  Trace distal bilateral lower extremity pitting edema. 2+ pedal pulses. No carotid bruits.  Chest:   Nontender without crepitus or deformity.   Back:   Nontender without crepitus or deformity. Abdomen: Abdomen is soft and nontender.  No evidence of intra-abdominal masses.  Positive bowel sounds noted in all quadrants.   Musculoskeletal: No joint deformity upper and lower extremities. Good ROM, no contractures. Normal muscle tone.  Neurologic: Patient is lethargic but arousable and oriented x3.  Patient on all commands.  Patient is moving all 4 extremities spontaneously.  Patient sensation is grossly intact.  Patient is responsive to verbal stimuli. Psychiatric: Limited assessment due to lethargy.  Patient seems to possess insight as to theircurrent situation.     Labs on Admission: I have personally reviewed following labs and imaging studies -   CBC: Recent Labs  Lab 03/14/20 1517 03/16/20 2253 03/17/20 0137  WBC 8.3 13.3*  --   NEUTROABS 6.9 10.9*  --   HGB 7.4* 10.1* 11.6*  HCT 24.8* 32.7* 34.0*  MCV 79.0* 80.0  --   PLT 484* 654*  --    Basic Metabolic Panel: Recent Labs  Lab 03/14/20 1517 03/16/20 2253 03/17/20 0137  NA 141 141 140  K 4.5 5.0 3.6  CL 103 103  --   CO2 26 26  --   GLUCOSE 138* 302*  --   BUN 30* 36*  --   CREATININE 1.18 1.25*  --   CALCIUM 9.5 9.7  --    GFR: Estimated Creatinine Clearance: 51.2 mL/min (A) (by C-G formula based on SCr of 1.25 mg/dL (H)). Liver Function Tests: Recent Labs  Lab 03/14/20 1517  AST 17  ALT 11  ALKPHOS 49  BILITOT 0.4  PROT 7.1  ALBUMIN 2.7*   No results for input(s): LIPASE, AMYLASE in the last 168 hours. No results for input(s): AMMONIA in the last 168 hours. Coagulation Profile: No results for input(s): INR, PROTIME in the last 168 hours. Cardiac Enzymes: No results for input(s): CKTOTAL, CKMB, CKMBINDEX, TROPONINI in the last 168 hours. BNP (last 3 results) No results for input(s): PROBNP in the  last 8760 hours. HbA1C: No results for input(s): HGBA1C in the last 72 hours. CBG: No results for input(s): GLUCAP in the last 168 hours. Lipid Profile: No results for input(s): CHOL, HDL, LDLCALC, TRIG, CHOLHDL, LDLDIRECT in the last 72 hours. Thyroid Function Tests: No results for input(s): TSH, T4TOTAL, FREET4, T3FREE, THYROIDAB in the last 72 hours. Anemia Panel: No results for  input(s): VITAMINB12, FOLATE, FERRITIN, TIBC, IRON, RETICCTPCT in the last 72 hours. Urine analysis:    Component Value Date/Time   COLORURINE YELLOW 10/30/2019 1424   APPEARANCEUR CLEAR 10/30/2019 1424   LABSPEC 1.015 10/30/2019 1424   PHURINE 6.5 10/30/2019 1424   GLUCOSEU >=500 (A) 10/30/2019 1424   HGBUR NEGATIVE 10/30/2019 1424   BILIRUBINUR NEGATIVE 10/30/2019 1424   KETONESUR NEGATIVE 10/30/2019 1424   PROTEINUR NEGATIVE 10/30/2019 1424   UROBILINOGEN 0.2 07/06/2012 2042   NITRITE NEGATIVE 10/30/2019 1424   LEUKOCYTESUR NEGATIVE 10/30/2019 1424    Radiological Exams on Admission - Personally Reviewed: DG Chest Port 1 View  Result Date: 03/16/2020 CLINICAL DATA:  79 year old male with shortness of breath. EXAM: PORTABLE CHEST 1 VIEW COMPARISON:  Chest radiograph dated 02/10/2020. FINDINGS: Bilateral lower lung field airspace densities concerning for developing infiltrate. Clinical correlation is recommended. Trace left pleural effusion may be present. No pneumothorax. Stable cardiomediastinal silhouette. Left pectoral pacemaker device. No acute osseous pathology. IMPRESSION: Bilateral lower lung field airspace densities concerning for developing infiltrate. Clinical correlation and follow-up recommended. Electronically Signed   By: Anner Crete M.D.   On: 03/16/2020 23:11    EKG: Personally reviewed.  Rhythm is paced rhythm with heart rate of 76 bpm.  No dynamic ST segment changes appreciated.  Assessment/Plan Principal Problem:   Acute respiratory failure with hypoxia ([place])   Patient  initially presenting with profound respiratory distress within hours of initiation of 2 units of packed red blood cell transfusion.  Complicated presentation, there are multiple possible etiologies including possible acute diastolic congestive heart failure, possible mild TRALI , or it is also possible (albeit less likely) patient was developing bilateral pneumonia already prior to the transfusion  Regardless, we are treating patient with broad-spectrum intravenous antibiotic therapy with cefepime and vancomycin  Patient is already been given 80 mg of IV Lasix in the emergency department.  I scheduled an additional 40 mg of IV Lasix later this morning, the daytime provider can decide whether further doses of Lasix are necessary.  Continue to provide patient with supplemental oxygen via nasal cannula with transition back to noninvasive positive pressure ventilation if necessary.  Active Problems:   Acute on chronic diastolic CHF (congestive heart failure) ([place])   As mentioned above, concern for possible superimposed acute on chronic diastolic congestive heart failure  Last echocardiogram performed in outside facility in September 2020 with ejection fraction of 60 to 65% with grade 2 diastolic dysfunction  Ordered repeat echocardiogram for later this morning.  Lasix being provided as mentioned above.    NSTEMI (non-ST elevated myocardial infarction) [facility])   Patient likely suffering from superimposed non-ST elevation myocardial infarction as patient is exhibiting impressive troponin elevation, initially 1194, followed by 1559 just several hours later.  With patient exhibiting a paced rhythm, and analysis of ST segments is difficult, there is no obvious ST segment change at this time.  Patient is chest pain-free  I discussed these findings with the overnight on call Cardiology Dr. Vickki Muff who agrees that it may be prudent to temporarily place patient on intravenous heparin infusion  overnight  Admitting patient to stepdown unit  Continuing to cycle cardiac enzymes and serial EKGs  Formal cardiology consultation in the morning    Sepsis with acute hypoxic respiratory failure without septic shock [facility])   Patient presenting with multiple SIRS criteria including tachycardia, tachypnea, fever and leukocytosis in the setting of concern for bilateral pulmonary infectious process and evidence of organ dysfunction with respiratory failure.  Treating  patient for suspected infectious process/sepsis for now with broad-spectrum intravenous antibiotic therapy with cefepime and vancomycin  Abstaining from aggressive intravenous fluid resuscitation considering likely concurrent acute diastolic just of heart failure.  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 [facility])   New diagnosis in the past several weeks, patient was to undergo initiation of palliative chemotherapy on 6/2  Continue close outpatient follow-up    Mixed hyperlipidemia due to type 2 diabetes mellitus ([place])   Continuing statin therapy    Dementia of the Alzheimer's type with late onset without behavioral disturbance ([place])  While patient carries this diagnosis patient possesses insight and is to able to make his own medical decisions  Goals of care discussion   Patient confirms that he is a DNR    Code Status:  DNR Family Communication: Deferred  Status is: Inpatient  Remains inpatient appropriate because:Ongoing diagnostic testing needed not appropriate for outpatient work up, IV treatments appropriate due to  intensity of illness or inability to take PO and Inpatient level of care appropriate due to severity of illness   Dispo: The patient is from: Home              Anticipated d/c is to: Home              Anticipated d/c date is: 3 days              Patient currently is not medically stable to d/c.         [name] MD [facility] Pager [phone]  If 7PM-7AM, please contact night-coverage [email or web address] Use universal [place] password for that web site. If you do not have the password, please call the hospital operator.  03/17/2020, 5:27 AM

## 2020-03-17 NOTE — ED Provider Notes (Signed)
[facility] EMERGENCY DEPARTMENT Provider Note   CSN: [id number] Arrival date & time: 03/16/20  2240     History Chief Complaint  Patient presents with  . Shortness of Breath   Level 5 caveat due to acuity of condition Kenneth Lyons is a 79 y.o. male.  The history is provided by the patient and the spouse.  Shortness of Breath Severity:  Severe Onset quality:  Sudden Timing:  Constant Progression:  Worsening Chronicity:  New Relieved by: cpap. Worsened by:  Activity Associated symptoms: cough   Associated symptoms: no chest pain and no fever   Patient with history of dementia, renal cell carcinoma, hypertension presents with shortness of breath.  Patient has had a recent bout of anemia and received 2 units blood transfusion earlier in the day on May 28.  Several hours later patient became abruptly short of breath.  EMS was called and patient felt improved and refused transport.  A few hours later patient began to get worse again and EMS was called and he was hypoxic on room air. Patient has had a recent cough, but no recorded fevers.  No chest pain.     Past Medical History:  Diagnosis Date  . Alzheimer's dementia ([place])   . Anemia associated with left renal cell cancer treated with erythropoietin ([place]) 03/14/2020  . Cancer ([place])   . Diabetes mellitus   . GERD (gastroesophageal reflux disease)   . Goals of care, counseling/discussion 02/28/2020  . High cholesterol   . Hypercalcemia of malignancy 03/14/2020  . Hypertension   . Iron deficiency anemia due to chronic blood loss 02/29/2020  . Kidney cancer, primary, with metastasis from kidney to other site, left ([place]) 03/14/2020  . Lymphoma ([place])   . 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 Stroke [facility])     Patient Active Problem List   Diagnosis Date Noted  . Kidney cancer, primary, with metastasis  from kidney to other site, left ([place]) 03/14/2020  . Anemia associated with left renal cell cancer treated with erythropoietin ([place]) 03/14/2020  . 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 Hypercalcemia of malignancy 03/14/2020  . Iron deficiency anemia due to chronic blood loss 02/29/2020  . Goals of care, counseling/discussion 02/28/2020  . Stroke ([place]) 10/20/2019  . Diabetes mellitus ([place]) 10/20/2019  . Late onset Alzheimer's disease with behavioral disturbance ([place]) 01/03/2019  . Type 2 diabetes mellitus without retinopathy ([place]) 08/05/2018  . Tricuspid regurgitation 05/19/2018  . Hyperplastic rectal polyp 02/22/2018  . Dermatochalasis of both upper eyelids 08/04/2017  . Epiretinal membrane (ERM) of right eye 08/04/2017  . Keratoconjunctivitis sicca of both eyes not specified as Sjogren's 08/04/2017  . Pinguecula, left eye 08/04/2017  . Presbyopia of both eyes 08/04/2017  . Vitreous floater, bilateral 08/04/2017  . Dementia ([place]) 06/03/2016  . Type 2 diabetes mellitus with microalbuminuria, without long-term current use of insulin ([place]) 04/16/2016  . Benign paroxysmal positional vertigo due to bilateral vestibular disorder 04/16/2016  . Pacemaker reprogramming/check 04/16/2016  . Essential hypertension 10/29/2015  . H/O malignant neoplasm of prostate 10/29/2015  . H/O: CVA (cerebrovascular accident) 10/29/2015  . Atrioventricular block, complete ([place]) 10/29/2015  . Carotid bruit 10/29/2015  . Cervical spondylosis without myelopathy 10/29/2015  . DDD (degenerative disc disease), lumbosacral 10/29/2015  . Diverticulosis of colon 10/29/2015  . Fredrickson type IIb or III hyperlipoproteinemia 10/29/2015  .  Hearing loss 10/29/2015  . History of benign neoplasm of colon 10/29/2015  . Hypertriglyceridemia 10/29/2015  . Osteoarthritis, knee 10/29/2015  . Right knee pain 10/29/2015  . Pseudophakia of both eyes 10/29/2015    Past  Surgical History:  Procedure Laterality Date  . APPENDECTOMY    . IR US GUIDE BX ASP/DRAIN  03/08/2020  . PACEMAKER INSERTION    . PROSTATE SURGERY    . TONSILLECTOMY         No family history on file.  Social History   Tobacco Use  . Smoking status: Former Research scientist (life sciences)  . Smokeless tobacco: Never Used  Substance Use Topics  . Alcohol use: Yes    Comment: occ  . Drug use: No    Home Medications Prior to Admission medications   Medication Sig Start Date End Date Taking? Authorizing Provider  alfuzosin (UROXATRAL) 10 MG 24 hr tablet TAKE 1 TABLET BY MOUTH ONCE DAILY 08/18/19   [provider]  amLODipine (NORVASC) 10 MG tablet Take 10 mg by mouth daily.    [provider]  aspirin EC 81 MG tablet Take 81 mg by mouth daily.     [provider]  cabozantinib (CABOMETYX) 40 MG tablet Take 1 tablet (40 mg total) by mouth daily. Take on an empty stomach, 1 hour before or 2 hours after meals. 03/21/20   Volanda Napoleon, MD  Calcium-Magnesium-Vitamin D (CALCIUM 1200+D3 PO) Take 1 tablet by mouth daily. 08/31/18   [provider]  carvedilol (COREG) 25 MG tablet TAKE 1 TABLET BY MOUTH TWICE DAILY WITH MEALS 01/28/18   [provider]  cetirizine (ZYRTEC) 10 MG tablet Take 10 mg by mouth daily.    [provider]  Cholecalciferol (VITAMIN D-1000 MAX ST) 25 MCG (1000 UT) tablet Take 1 capsule by mouth daily.    [provider]  clopidogrel (PLAVIX) 75 MG tablet TAKE 1 TABLET BY MOUTH ONCE DAILY 03/20/17   [provider]  Cyanocobalamin 5000 MCG SUBL Place 5,000 mcg under the tongue daily. Place one dose under the tongue daily     [provider]  Dulaglutide (TRULICITY) 4.09 WJ/1.9JY SOPN Inject 0.75 mg into the skin every Wednesday.     [provider]  escitalopram (LEXAPRO) 10 MG tablet Take by mouth. 10/04/18   [provider]  fentaNYL (DURAGESIC) 25 MCG/HR Place 1 patch onto the skin every 3  (three) days. 03/14/20   Volanda Napoleon, MD  furosemide (LASIX) 20 MG tablet Take 10 mg by mouth daily.  02/17/20   [provider]  gabapentin (NEURONTIN) 100 MG capsule Take 100 mg by mouth at bedtime as needed (pain).  02/24/20   [provider]  glipiZIDE (GLUCOTROL) 10 MG tablet Take 15 mg by mouth daily before breakfast.     [provider]  Glucosamine-Chondroitin 250-200 MG TABS Take 1 tablet by mouth 2 (two) times daily.    [provider]  hydrochlorothiazide (HYDRODIURIL) 25 MG tablet Take 12.5 mg by mouth daily.  01/27/17   [provider]  HYDROcodone-acetaminophen (NORCO) 7.5-325 MG tablet Take 1 tablet by mouth every 6 (six) hours as needed for moderate pain. 03/14/20   Volanda Napoleon, MD  lisinopril (PRINIVIL,ZESTRIL) 40 MG tablet Take 40 mg by mouth daily.  01/28/18   [provider]  memantine (NAMENDA) 10 MG tablet Take 10 mg by mouth 2 (two) times daily.  05/12/17   [provider]  metFORMIN (GLUCOPHAGE) 1000 MG tablet Take 1,000  mg by mouth 2 (two) times daily with a meal.  01/28/18   [provider]  Multiple Vitamin (MULTIVITAMIN) tablet Take 1 tablet by mouth daily.    [provider]  Omega-3 Fatty Acids (FISH OIL PO) Take 2 capsules by mouth daily.    [provider]  rosuvastatin (CRESTOR) 20 MG tablet Take 20 mg by mouth daily. 08/17/19   [provider]  selenium 50 MCG TABS Take 50 mcg by mouth daily.    [provider]    Allergies    Patient has no known allergies.  Review of Systems   Review of Systems  Unable to perform ROS: Acuity of condition  Constitutional: Negative for fever.  Respiratory: Positive for cough and shortness of breath.   Cardiovascular: Negative for chest pain.    Physical Exam Updated Vital Signs BP (!) 160/91   Pulse 93   Temp 98.9 F (37.2 C) (Axillary)   Resp (!) 22   SpO2 96%   Physical Exam  CONSTITUTIONAL: Elderly,  ill-appearing HEAD: Normocephalic/atraumatic EYES: EOMI/PERRL ENMT: mask in place NECK: supple no meningeal signs SPINE/BACK:entire spine nontender CV: S1/S2 noted, no murmurs/rubs/gallops noted LUNGS: Mild distress noted, crackles bilaterally ABDOMEN: soft, nontender, no rebound or guarding, bowel sounds noted throughout abdomen GU:no cva tenderness NEURO: Pt is awake/alert/appropriate, moves all extremitiesx4.  No facial droop.  EXTREMITIES: pulses normal/equal, full ROM, pitting edema noted bilateral lower extremities SKIN: warm, color normal PSYCH: Unable to assess  ED Results / Procedures / Treatments   Labs (all labs ordered are listed, but only abnormal results are displayed) Labs Reviewed  BASIC METABOLIC PANEL - Abnormal; Notable for the following components:      Result Value   Glucose, Bld 302 (*)    BUN 36 (*)    Creatinine, Ser 1.25 (*)    GFR calc non Af Amer 55 (*)    All other components within normal limits  CBC WITH DIFFERENTIAL/PLATELET - Abnormal; Notable for the following components:   WBC 13.3 (*)    RBC 4.09 (*)    Hemoglobin 10.1 (*)    HCT 32.7 (*)    MCH 24.7 (*)    RDW 17.4 (*)    Platelets 654 (*)    nRBC 0.3 (*)    Neutro Abs 10.9 (*)    Abs Immature Granulocytes 0.13 (*)    All other components within normal limits  BRAIN NATRIURETIC PEPTIDE - Abnormal; Notable for the following components:   B Natriuretic Peptide 512.5 (*)    All other components within normal limits  POCT I-STAT 7, (LYTES, BLD GAS, ICA,H+H) - Abnormal; Notable for the following components:   pO2, Arterial 154 (*)    Acid-Base Excess 3.0 (*)    HCT 34.0 (*)    Hemoglobin 11.6 (*)    All other components within normal limits  SARS CORONAVIRUS 2 BY RT PCR (HOSPITAL ORDER, [facility])  CULTURE, BLOOD (ROUTINE X 2)  CULTURE, BLOOD (ROUTINE X 2)  LACTIC ACID, PLASMA  LACTIC ACID, PLASMA  BLOOD GAS, ARTERIAL  TROPONIN I (HIGH SENSITIVITY)     EKG EKG Interpretation  Date/Time:  Friday Mar 16 2020 22:42:50 EDT Ventricular Rate:  114 PR Interval:    QRS Duration: 155 QT Interval:  362 QTC Calculation: 499 R Axis:   -64 Text Interpretation: Ventricular-paced rhythm Confirmed by Malvin Johns [id number]) on 03/16/2020 10:54:47 PM   Radiology DG Chest Port 1 View  Result Date: 03/16/2020 CLINICAL  DATA:  79 year old male with shortness of breath. EXAM: PORTABLE CHEST 1 VIEW COMPARISON:  Chest radiograph dated 02/10/2020. FINDINGS: Bilateral lower lung field airspace densities concerning for developing infiltrate. Clinical correlation is recommended. Trace left pleural effusion may be present. No pneumothorax. Stable cardiomediastinal silhouette. Left pectoral pacemaker device. No acute osseous pathology. IMPRESSION: Bilateral lower lung field airspace densities concerning for developing infiltrate. Clinical correlation and follow-up recommended. Electronically Signed   By: Anner Crete M.D.   On: 03/16/2020 23:11    Procedures .Critical Care Performed by: Ripley Fraise, MD Authorized by: Ripley Fraise, MD   Critical care provider statement:    Critical care time (minutes):  60   Critical care start time:  03/16/2020 11:30 PM   Critical care end time:  03/17/2020 12:30 AM   Critical care was necessary to treat or prevent imminent or life-threatening deterioration of the following conditions:  Respiratory failure and sepsis   Critical care was time spent personally by me on the following activities:  Ordering and review of radiographic studies, ordering and review of laboratory studies, pulse oximetry, re-evaluation of patient's condition, review of old charts, ordering and performing treatments and interventions, evaluation of patient's response to treatment, obtaining history from patient or surrogate, discussions with consultants and development of treatment plan with patient or surrogate   I assumed direction of  critical care for this patient from another provider in my specialty: no     Medications Ordered in ED Medications  cefTRIAXone (ROCEPHIN) 1 g in sodium chloride 0.9 % 100 mL IVPB (1 g Intravenous New Bag/Given 03/17/20 0208)  azithromycin (ZITHROMAX) 500 mg in sodium chloride 0.9 % 250 mL IVPB (has no administration in time range)  vancomycin (VANCOREADY) IVPB 1500 mg/300 mL (has no administration in time range)  furosemide (LASIX) injection 80 mg (80 mg Intravenous Given 03/17/20 0104)  acetaminophen (TYLENOL) tablet 650 mg (650 mg Oral Given 03/17/20 0214)    ED Course  I have reviewed the triage vital signs and the nursing notes.  Pertinent labs & imaging results that were available during my care of the patient were reviewed by me and considered in my medical decision making (see chart for details).    MDM Rules/Calculators/A&P                      12:14 AM   Patient with history of renal cell carcinoma presents with abrupt shortness of breath.  He is improving on BiPAP.  Strong consideration for pulmonary edema given patient just received blood transfusion and appears to be volume overloaded.  However x-ray is read as infiltrates.  Awaiting further labs.  Will follow closely  2:30 AM  Patient is improved. He has been taken off BiPAP and his effort is improved  This could represent transfusion related lung injury. He has been given Lasix for diuresis as there was some concern for volume overload. Due to fever and infiltrates will also add on antibiotics    This patient presents to the ED for concern of shortness of breath, this involves an extensive number of treatment options, and is a complaint that carries with it a high risk of complications and morbidity.  The differential diagnosis includes pneumonia, COVID-19, CHF, pulmonary embolism, acute coronary syndrome, TRALI   Lab Tests:   I Ordered, reviewed, and interpreted labs, which included BNP, lactic acid, electrolytes,  CBC  Medicines ordered:   I ordered medication Lasix for diuresis  Imaging Studies ordered:   I ordered  imaging studies which included x-ray of the chest   I independently visualized and interpreted imaging which showed edema versus infiltrate  Additional history obtained:   Additional history obtained from wife  Previous records obtained and reviewed   Consultations Obtained:   I consulted dr Cyd Silence  and discussed lab and imaging findings  Reevaluation:  After the interventions stated above, I reevaluated the patient and found patient is improved  Critical Interventions:  . BiPAP/noninvasive ventilation  Final Clinical Impression(s) / ED Diagnoses Final diagnoses:  Acute respiratory failure with hypoxia ([place])  Pulmonary infiltrates  Hyperglycemia    Rx / DC Orders ED Discharge Orders    None       Ripley Fraise, MD 03/17/20 0231

## 2020-03-17 NOTE — Consult Note (Signed)
Cardiology Consultation:   Patient ID: Kenneth Lyons MRN: [id number]; DOB: [birth year]  Admit date: 03/16/2020 Date of Consult: 03/17/2020  Primary Care Provider: Nicola Girt, DO Primary Cardiologist: Dr. Minna [facility])   Patient Profile:   Kenneth Lyons is a 79 y.o. male with a hx of CHB s/p st.jude PPM in 2013, HTN, DM, mild MR, mild AS,  hx of CVA, HLD, Alzheimer's dementia and recent diagnosis of metastatic renal cell carcinoma who is being seen today for the evaluation of Elevated troponin at the request of Dr. Sloan Leiter.   Most recently seen by regular cardiologist 02/17/20 for bilateral lower extremity edema. Added lasix 10mg  daily for 3 days and then PRN. Ordered echo which is pending.   History of Present Illness:   Kenneth Lyons recently diagnosed with stage IV metastatic renal cell carcinoma of the left kidney with multiple lymph node metastasis. Patient is scheduled to start 21 cycle on Axitinib and Pembrolizumab on 6/2.  Yesterday he received 2 units of packed red blood cell as outpatient for anemia associated with malignancy.  After transfusion he became shortness of breath and EMS was called but declined further evaluation.  However, his breathing continued to get worse and EMS was called again.  Placed on BiPAP and came to ER and he was found to be in respiratory distress with hypoxia.  This was his first transfusion in his life.   Checks x-ray concerning for developing bilateral infiltrate.  Covid negative.  BNP 512.  Normal lactic acid. Got IV Lasix 80 mg. He gradually weaned off BiPAP and now breathing stable. Hs-troponin 1194>>1559. Started on IV heparin after discussion with on call cardiology fellow.  Hgb 10.1>>11.6>>9.7.   Patient denies history of MI.  Remote stress test normal.  No exertional chest pain.  No orthopnea, PND or melena.  Dealing with lower extremity edema for 6 to 8 weeks.   Past Medical History:  Diagnosis Date  . Alzheimer's dementia ([place])    . Anemia associated with left renal cell cancer treated with erythropoietin ([place]) 03/14/2020  . Cancer ([place])   . Diabetes mellitus   . GERD (gastroesophageal reflux disease)   . Goals of care, counseling/discussion 02/28/2020  . High cholesterol   . Hypercalcemia of malignancy 03/14/2020  . Hypertension   . Iron deficiency anemia due to chronic blood loss 02/29/2020  . Kidney cancer, primary, with metastasis from kidney to other site, left ([place]) 03/14/2020  . Lymphoma ([place])   . 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 Malignant neoplasm of kidney metastatic to lymph nodes of multiple regions ([place]) 03/14/2020  . Stroke [facility])     Past Surgical History:  Procedure Laterality Date  . APPENDECTOMY    . IR US GUIDE BX ASP/DRAIN  03/08/2020  . PACEMAKER INSERTION    . PROSTATE SURGERY    . TONSILLECTOMY      Inpatient Medications: Scheduled Meds: . alfuzosin  10 mg Oral Daily  . amLODipine  10 mg Oral Daily  . aspirin EC  81 mg Oral Daily  . carvedilol  25 mg Oral BID WC  . clopidogrel  75 mg Oral Daily  . escitalopram  10 mg Oral Daily  . insulin aspart  0-15 Units Subcutaneous TID AC & HS  . lisinopril  40 mg Oral Daily  . memantine  10 mg Oral BID  . pantoprazole (PROTONIX) IV  40 mg Intravenous Daily  . rosuvastatin  20 mg Oral q1800   Continuous Infusions: .  ceFEPime (MAXIPIME) IV    . heparin 1,100 Units/hr (03/17/20 0541)  . [START ON 03/18/2020] vancomycin     PRN Meds: acetaminophen **OR** acetaminophen, HYDROcodone-acetaminophen, ondansetron **OR** ondansetron (ZOFRAN) IV, polyethylene glycol  Allergies:   No Known Allergies  Social History:   Social History   Socioeconomic History  . Marital status: Married    Spouse name: Not on file  . Number of children: Not on file  . Years of education: Not on file  . Highest education level: Not on file  Occupational History    Comment: Retired Engineer, maintenance (IT)  Tobacco Use  . Smoking status: Former Research scientist (life sciences)  .  Smokeless tobacco: Never Used  Substance and Sexual Activity  . Alcohol use: Yes    Comment: occ  . Drug use: No  . Sexual activity: Not on file  Other Topics Concern  . Not on file  Social History Narrative  . Not on file   Social Determinants of Health   Financial Resource Strain: Low Risk   . Difficulty of Paying Living Expenses: Not hard at all  Food Insecurity: No Food Insecurity  . Worried About Charity fundraiser in the Last Year: Never true  . Ran Out of Food in the Last Year: Never true  Transportation Needs: No Transportation Needs  . Lack of Transportation (Medical): No  . Lack of Transportation (Non-Medical): No  Physical Activity: Inactive  . Days of Exercise per Week: 0 days  . Minutes of Exercise per Session: 0 min  Stress: No Stress Concern Present  . Feeling of Stress : Only a little  Social Connections: Unknown  . Frequency of Communication with Friends and Family: More than three times a week  . Frequency of Social Gatherings with Friends and Family: Not on file  . Attends Religious Services: Not on file  . Active Member of Clubs or Organizations: Not on file  . Attends Archivist Meetings: Not on file  . Marital Status: Married  Human resources officer Violence: Not At Risk  . Fear of Current or Ex-Partner: No  . Emotionally Abused: No  . Physically Abused: No  . Sexually Abused: No    Family History:   Family History  Problem Relation Age of Onset  . Other Neg Hx      ROS:  Please see the history of present illness.  All other ROS reviewed and negative.     Physical Exam/Data:   Vitals:   03/17/20 0515 03/17/20 0530 03/17/20 0600 03/17/20 0808  BP: 115/66 132/72 (!) 145/88 124/75  Pulse: 71 72 82 88  Resp: 14 16 19 17   Temp:   97.7 F (36.5 C)   TempSrc:   Oral   SpO2: 96% 96% 99% 95%  Weight:   85.8 kg   Height:   5\' 7"  (1.702 m)     Intake/Output Summary (Last 24 hours) at 03/17/2020 0845 Last data filed at 03/17/2020  0344 Gross per 24 hour  Intake 0.81 ml  Output --  Net 0.81 ml   Last 3 Weights 03/17/2020 03/17/2020 03/14/2020  Weight (lbs) 189 lb 2.5 oz 185 lb 190 lb  Weight (kg) 85.8 kg 83.915 kg 86.183 kg     Body mass index is 29.63 kg/m.  General:  Well nourished, well developed, in no acute distress HEENT: normal Lymph: no adenopathy Neck: no JVD Endocrine:  No thryomegaly Vascular: No carotid bruits; FA pulses 2+ bilaterally without bruits  Cardiac:  normal S1, S2; RRR; 2/6 systolic murmur  Lungs:  clear to auscultation bilaterally, no wheezing, rhonchi or rales  Abd: soft, nontender, no hepatomegaly  Ext:1+  edema Musculoskeletal:  No deformities, BUE and BLE strength normal and equal Skin: warm and dry  Neuro:  CNs 2-12 intact, no focal abnormalities noted Psych:  Normal affect   EKG:  The EKG was personally reviewed and demonstrates:  V paced rhythm  Telemetry:  Telemetry was personally reviewed and demonstrates:  V paced rhythm   Relevant CV Studies:  Echo 06/2019 outside hospital  Conclusions Summary Left ventricle cavity size is normal. Moderate concentric left ventricular hypertrophy. The left ventricular systolic function is normal with an asynchronous contraction pattern of the septum due to IVCD Ejection fraction is visually estimated at 60-65%. Diastolic function is abnormal--grade II diastolic dysfunction Mild aortic stenosis. Signature ------------------------------------------------------------------------------  Electronically signed by Abran Richard, MD, FACC(Interpreting physician) on  07/05/2019 04:37 PM ------------------------------------------------------------------------------ Findings Mitral Valve Mild mitral annular calcification. The mitral valve leaflets are pliable and mobile. No evidence of mitral stenosis. Trace mitral regurgitation. Aortic Valve Trileaflet aortic valve. Thickened calcified aortic valve leaflets with restricted leaflet  motion Gradients through the aortic valve may be overestimated. Mean pressure gradient of 15 mmHg. Aortic valve area of 1.83 cm squared. Dimensionless index: 0.52. No aortic regurgitation. Tricuspid Valve Tricuspid valve is structurally normal. No evidence of tricuspid stenosis. Mild tricuspid regurgitation. Estimated RV systolic pressure: 39 mmHg. Pulmonic Valve The pulmonic valve was not well visualized. No Doppler evidence of pulmonic stenosis or insufficiency. Left Atrium Normal size left atrium. Left atrial volume index of 29 ml per meters squared BSA. Left Ventricle Left ventricle cavity size is normal. Moderate concentric left ventricular hypertrophy. The left ventricular systolic function is normal with an asynchronous contraction pattern of the septum due to IVCD Ejection fraction is visually estimated at 60-65%. Diastolic function is abnormal--grade II diastolic dysfunction Right Atrium Normal size right atrium. Pacer and/or defibrillator wires visualized in right atrium. Right Ventricle Normal right ventricular size and function. Pacer and/or defibrillator wires visualized in right ventricle. Right ventricular systolic pressure of 39 mmHg, consistent with mild pulmonary hypertension. Pericardial Effusion No evidence of pericardial effusion. Pleural Effusion No pleural effusion seen. Miscellaneous The aortic root diameter is within normal limits. The IVC is normal. Intact interatrial septum with no evidence of shunt by color flow Doppler. M-Mode/2D Measurements & Calculations  LV Diastolic Dimension: LV Systolic Dimension:  LA Dimension: 4.1 cmAO  4.9 cm          3.3 cm          Root Dimension: 3.1 cmLA  LV FS:32.65 %      LV Volume Diastolic:   Area: 19 cm2  LV PW Diastolic: 1.4 cm 30.1 ml  Septum Diastolic: 1.4 cm LV Volume Systolic: 60.1  CO: 0.93 l/min      ml  CI: 3.39 l/m*m2     LV EDV/LV EDV Index:   RV Diastolic  Dimension:              98.7 ml/50 m2LV ESV/LV  3.4 cm  LV Area Diastolic: 23.5 ESV Index: 38.3 ml/19 m2  cm2           EF Calculated: 61.2 %  LA/Aorta: 5.73  LV Area Systolic: 22.0  EF Estimated: 60 %    Ascending Aorta: 3.6 cm  cm2           LV Length: 8.01 cm    LA volume/Index: 56.6 ml                           /  83m2              LVOT: 2 cm Doppler Measurements & Calculations  MV Peak E-Wave: 114 cm/s AV Peak Velocity: 268 cm/s  LVOT Peak Velocity:  MV Peak A-Wave: 126 cm/s AV Peak Gradient: 28.73 mmHg 143 cm/s  MV E/A Ratio: 0.9     AV Mean Velocity: 180 cm/s  LVOT Mean Velocity:  MV Peak Gradient: 5.2   AV Mean Gradient: 15 mmHg  98.5 cm/s  mmHg           AV VTI: 55.2 cm       LVOT Peak Gradient: 8  MV Mean Gradient: 2 mmHg AV Area (Continuity):1.83  mmHgLVOT Mean  MV Mean Velocity: 70.6  cm2             Gradient: 4 mmHg  cm/s                          Estimated RVSP: 39  MV Deceleration Time: 211 LVOT VTI: 32.2 cm      mmHg  msec                          Estimated RAP:5 mmHg  MV P1/2t: 74 msec  MVA by PHT2.97 cm2  MV Area (continuity):  2.72 cm2  TDI E' Velocity: 5.33  cm/s  E/E' prime 22  Laboratory Data:  High Sensitivity Troponin:   Recent Labs  Lab 03/17/20 0217 03/17/20 0417  TROPONINIHS 1,194* 1,559*     Chemistry Recent Labs  Lab 03/14/20 1517 03/16/20 2253 03/17/20 0137  NA 141 141 140  K 4.5 5.0 3.6  CL 103 103  --   CO2 26 26  --   GLUCOSE 138* 302*  --   BUN 30* 36*  --   CREATININE 1.18 1.25*  --   CALCIUM 9.5 9.7  --   GFRNONAA 59* 55*  --   GFRAA >60 >60  --   ANIONGAP 12 12  --     Recent Labs  Lab 03/14/20 1517  PROT 7.1  ALBUMIN 2.7*  AST 17  ALT 11  ALKPHOS 49  BILITOT 0.4   Hematology Recent Labs  Lab 03/14/20 1517 03/14/20 1517  03/16/20 2253 03/17/20 0137 03/17/20 0757  WBC 8.3  --  13.3*  --  10.0  RBC 3.14*  --  4.09*  --  3.89*  HGB 7.4*  --  10.1* 11.6* 9.7*  HCT 24.8*   < > 32.7* 34.0* 30.9*  MCV 79.0*  --  80.0  --  79.4*  MCH 23.6*  --  24.7*  --  24.9*  MCHC 29.8*  --  30.9  --  31.4  RDW 18.0*  --  17.4*  --  17.7*  PLT 484*  --  654*  --  540*   < > = values in this interval not displayed.   BNP Recent Labs  Lab 03/16/20 2250  BNP 512.5*    Radiology/Studies:  DG Chest Port 1 View  Result Date: 03/16/2020 CLINICAL DATA:  79 year old male with shortness of breath. EXAM: PORTABLE CHEST 1 VIEW COMPARISON:  Chest radiograph dated 02/10/2020. FINDINGS: Bilateral lower lung field airspace densities concerning for developing infiltrate. Clinical correlation is recommended. Trace left pleural effusion may be present. No pneumothorax. Stable cardiomediastinal silhouette. Left pectoral pacemaker device. No acute osseous pathology. IMPRESSION: Bilateral lower lung field airspace densities concerning for developing infiltrate. Clinical correlation  and follow-up recommended. Electronically Signed   By: Anner Crete M.D.   On: 03/16/2020 23:11    Assessment and Plan:   1. Elevated troponin/non-STEMI - Hs-troponin 1194>>1559.  In setting of acute respiratory distress and acute anemia.  Patient denies chest pain.  Remote stress test normal.  No history of MI.  Likely his presentation consistent with type II.  He was started on IV heparin after discussion with cardiology fellow.  On aspirin 81 mg daily (here 325mg  qd>> reduced to home dose) and 75 mg daily at home for stroke. - Continue BB and statin. Pending echo.   2.  Acute hypoxic respiratory failure -After receiving 2 units of packed red blood.  Could be due to volume exacerbation.  Initially required BiPAP now weaned off.  Given IV Lasix 80 mg.  Chest x-ray with developing infiltrate.  On broad-spectrum antibiotic.  BNP 512. -He does have history of  diastolic dysfunction grade 2, mild aortic stenosis and mild mitral regurgitation by echo 06/2019.  Agree with repeat echocardiogram as he was recently dealing with bilateral lower extremity edema and plan was to do this as outpatient.  3.  Metastatic renal cell carcinoma -Plan to start chemotherapy next week  4.  Diabetes mellitus -Per primary team  5.  Hypertension - BP stable on current medications   6. Anemia -Hgb 10.1>>11.6>>9.7. transfused 2 Units yesterday.     For questions or updates, please contact [place] Please consult [email or web address] for contact info under     Jarrett Soho, PA  03/17/2020 8:45 AM

## 2020-03-17 NOTE — Progress Notes (Signed)
Patient taken off BIPAP so he could receive meds. Plan is to leave patient off at this time as he is tolerating nasal cannula well. RT will continue to monitor patient.

## 2020-03-18 DIAGNOSIS — L8990 Pressure ulcer of unspecified site, unspecified stage: Secondary | ICD-10-CM | POA: Insufficient documentation

## 2020-03-18 LAB — TYPE AND SCREEN
ABO/RH(D): O POS
Antibody Screen: NEGATIVE
Unit division: 0
Unit division: 0

## 2020-03-18 LAB — COMPREHENSIVE METABOLIC PANEL
ALT: 18 U/L (ref 0–44)
AST: 28 U/L (ref 15–41)
Albumin: 2.4 g/dL — ABNORMAL LOW (ref 3.5–5.0)
Alkaline Phosphatase: 45 U/L (ref 38–126)
Anion gap: 16 — ABNORMAL HIGH (ref 5–15)
BUN: 35 mg/dL — ABNORMAL HIGH (ref 8–23)
CO2: 27 mmol/L (ref 22–32)
Calcium: 9.2 mg/dL (ref 8.9–10.3)
Chloride: 100 mmol/L (ref 98–111)
Creatinine, Ser: 1.16 mg/dL (ref 0.61–1.24)
GFR calc Af Amer: >60 mL/min (ref >60)
GFR calc non Af Amer: >60 mL/min (ref >60)
Glucose, Bld: 111 mg/dL — ABNORMAL HIGH (ref 70–99)
Potassium: 3.6 mmol/L (ref 3.5–5.1)
Sodium: 143 mmol/L (ref 135–145)
Total Bilirubin: 0.4 mg/dL (ref 0.3–1.2)
Total Protein: 6.4 g/dL — ABNORMAL LOW (ref 6.5–8.1)

## 2020-03-18 LAB — CBC
HCT: 29 % — ABNORMAL LOW (ref 39.0–52.0)
Hemoglobin: 8.8 g/dL — ABNORMAL LOW (ref 13.0–17.0)
MCH: 24.3 pg — ABNORMAL LOW (ref 26.0–34.0)
MCHC: 30.3 g/dL (ref 30.0–36.0)
MCV: 80.1 fL (ref 80.0–100.0)
Platelets: 471 10*3/uL — ABNORMAL HIGH (ref 150–400)
RBC: 3.62 MIL/uL — ABNORMAL LOW (ref 4.22–5.81)
RDW: 18.2 % — ABNORMAL HIGH (ref 11.5–15.5)
WBC: 7.4 10*3/uL (ref 4.0–10.5)
nRBC: 0 % (ref 0.0–0.2)

## 2020-03-18 LAB — GLUCOSE, CAPILLARY
Glucose-Capillary: 122 mg/dL — ABNORMAL HIGH (ref 70–99)
Glucose-Capillary: 199 mg/dL — ABNORMAL HIGH (ref 70–99)
Glucose-Capillary: 156 mg/dL — ABNORMAL HIGH (ref 70–99)
Glucose-Capillary: 202 mg/dL — ABNORMAL HIGH (ref 70–99)

## 2020-03-18 LAB — BPAM RBC
Blood Product Expiration Date: 202106282359
Blood Product Expiration Date: 202106282359
ISSUE DATE / TIME: 202105280822
ISSUE DATE / TIME: 202105280822
Unit Type and Rh: 5100
Unit Type and Rh: 5100

## 2020-03-18 LAB — HEPARIN LEVEL (UNFRACTIONATED)
Heparin Unfractionated: <0.1 IU/mL — ABNORMAL LOW (ref 0.30–0.70)
Heparin Unfractionated: 0.18 IU/mL — ABNORMAL LOW (ref 0.30–0.70)

## 2020-03-18 MED ORDER — POLYETHYLENE GLYCOL 3350 17 G PO PACK
17.0000 g | PACK | Freq: Every day | ORAL | Status: DC
  Administered 2020-03-19: 17 g via ORAL
  Filled 2020-03-18: qty 1

## 2020-03-18 MED ORDER — POTASSIUM CHLORIDE CRYS ER 20 MEQ PO TBCR: 40.0000 meq | EXTENDED_RELEASE_TABLET | Freq: Two times a day (BID) | ORAL | Status: DC

## 2020-03-18 MED ORDER — SPIRONOLACTONE 25 MG PO TABS
25.0000 mg | ORAL_TABLET | Freq: Every day | ORAL | Status: DC
  Administered 2020-03-18 – 2020-03-20 (×3): 25 mg via ORAL
  Filled 2020-03-18 (×3): qty 1

## 2020-03-18 MED ORDER — GABAPENTIN 100 MG PO CAPS: 100.0000 mg | ORAL_CAPSULE | Freq: Every evening | ORAL | Status: DC | PRN

## 2020-03-18 MED ORDER — POTASSIUM CHLORIDE CRYS ER 20 MEQ PO TBCR
40.0000 meq | EXTENDED_RELEASE_TABLET | Freq: Once | ORAL | Status: AC
  Administered 2020-03-18: 40 meq via ORAL
  Filled 2020-03-18: qty 2

## 2020-03-18 MED ORDER — FUROSEMIDE 10 MG/ML IJ SOLN
80.0000 mg | Freq: Two times a day (BID) | INTRAMUSCULAR | Status: AC
  Administered 2020-03-18 (×2): 80 mg via INTRAVENOUS
  Filled 2020-03-18 (×2): qty 8

## 2020-03-18 MED ORDER — FENTANYL 25 MCG/HR TD PT72
1.0000 | MEDICATED_PATCH | TRANSDERMAL | Status: DC
  Administered 2020-03-18: 1 via TRANSDERMAL
  Filled 2020-03-18: qty 1

## 2020-03-18 NOTE — Progress Notes (Addendum)
ANTICOAGULATION and ANTIBIOTIC CONSULT NOTE  Pharmacy Consult for heparin Indication: ACS and PNA  No Known Allergies  Patient Measurements: Height: 5\' 7"  (170.2 cm) Weight: 85.8 kg (189 lb 2.5 oz) IBW/kg (Calculated) : 66.1 Heparin Dosing Weight: 80kg  Vital Signs: Temp: 98 F (36.7 C) (05/30 0736) Temp Source: Oral (05/30 0736) BP: 155/66 (05/30 0736) Pulse Rate: 71 (05/30 0807)  Labs: Recent Labs    03/16/20 2253 03/16/20 2253 03/17/20 0137 03/17/20 0137 03/17/20 0217 03/17/20 0417 03/17/20 0757 03/17/20 1416 03/17/20 1811 03/18/20 0224 03/18/20 0824  HGB 10.1*   < > 11.6*   < >  --   --  9.7*  --   --  8.8*  --   HCT 32.7*   < > 34.0*  --   --   --  30.9*  --   --  29.0*  --   PLT 654*  --   --   --   --   --  540*  --   --  471*  --   APTT  --   --   --   --   --   --  74*  --   --   --   --   LABPROT  --   --   --   --   --   --  16.4*  --   --   --   --   INR  --   --   --   --   --   --  1.4*  --   --   --   --   HEPARINUNFRC  --   --   --   --   --   --   --  <0.10*  --  <0.10* 0.18*  CREATININE 1.25*  --   --   --   --   --  1.13  --   --  1.16  --   TROPONINIHS  --   --   --   --    < > 1,559*  --  1,657* 1,493*  --   --    < > = values in this interval not displayed.    Estimated Creatinine Clearance: 55.8 mL/min (by C-G formula based on SCr of 1.16 mg/dL).   Medical History: Past Medical History:  Diagnosis Date  . Alzheimer's dementia ([place])   . Anemia associated with left renal cell cancer treated with erythropoietin ([place]) 03/14/2020  . Cancer ([place])   . Diabetes mellitus   . GERD (gastroesophageal reflux disease)   . Goals of care, counseling/discussion 02/28/2020  . High cholesterol   . Hypercalcemia of malignancy 03/14/2020  . Hypertension   . Iron deficiency anemia due to chronic blood loss 02/29/2020  . Kidney cancer, primary, with metastasis from kidney to other site, left ([place]) 03/14/2020  . Lymphoma ([place])   . Lymphoma of lymph nodes in  abdomen ([place]) 02/29/2020  . Malignant neoplasm of kidney metastatic to lymph nodes of multiple regions ([place]) 03/14/2020  . Stroke [facility])     Assessment: 79yo male c/o SOB with elevated troponins, cardiology planning to medically manage. Heparin level trending up to 0.18, H/H low. Will defer heparin level recheck today as anticoagulation can likely be stopped soon (planning medical management of ACS).  Goal of Therapy:  Heparin level 0.3-0.7 units/ml  Monitor platelets by anticoagulation protocol: Yes   Plan:  -Increase heparin to 1600 units/h -Recheck heparin level in am as above  Arrie Senate, PharmD, BCPS Clinical Pharmacist [phone] Please check AMION for all [place] numbers 03/18/2020

## 2020-03-18 NOTE — Progress Notes (Signed)
ANTICOAGULATION and ANTIBIOTIC CONSULT NOTE  Pharmacy Consult for heparin Indication: ACS and PNA  No Known Allergies  Patient Measurements: Height: 5\' 7"  (170.2 cm) Weight: 85.8 kg (189 lb 2.5 oz) IBW/kg (Calculated) : 66.1 Heparin Dosing Weight: 80kg  Vital Signs: Temp: 98 F (36.7 C) (05/29 2333) Temp Source: Oral (05/29 2333) BP: 151/83 (05/29 2333) Pulse Rate: 72 (05/29 2333)  Labs: Recent Labs    03/16/20 2253 03/16/20 2253 03/17/20 0137 03/17/20 0137 03/17/20 0217 03/17/20 0417 03/17/20 0757 03/17/20 1416 03/17/20 1811 03/18/20 0224  HGB 10.1*   < > 11.6*   < >  --   --  9.7*  --   --  8.8*  HCT 32.7*   < > 34.0*  --   --   --  30.9*  --   --  29.0*  PLT 654*  --   --   --   --   --  540*  --   --  471*  APTT  --   --   --   --   --   --  74*  --   --   --   LABPROT  --   --   --   --   --   --  16.4*  --   --   --   INR  --   --   --   --   --   --  1.4*  --   --   --   HEPARINUNFRC  --   --   --   --   --   --   --  <0.10*  --  <0.10*  CREATININE 1.25*  --   --   --   --   --  1.13  --   --   --   TROPONINIHS  --   --   --   --    < > 1,559*  --  1,657* 1,493*  --    < > = values in this interval not displayed.    Estimated Creatinine Clearance: 57.3 mL/min (by C-G formula based on SCr of 1.13 mg/dL).  Assessment: 79yo male c/o SOB with elevated troponins, cardiology planning to medically manage.   Heparin level remains undetectable. RN went in at ~0100 and noticed that heparin was still running at 1100 units/hr (instead of the 1400 units/hr that was ordered) - she increased the gtt rate at this time.  Goal of Therapy:  Heparin level 0.3-0.7 units/ml  Monitor platelets by anticoagulation protocol: Yes   Plan:  Continue heparin gtt at 1400 units/h Recheck heparin level 8 hr post gtt increase   Sherlon Handing, PharmD, BCPS Please see amion for complete clinical pharmacist phone list 03/18/2020

## 2020-03-18 NOTE — Plan of Care (Signed)

## 2020-03-18 NOTE — Plan of Care (Signed)
  Problem: Education: Goal: Ability to demonstrate management of disease process will improve 03/18/2020 1516 by Shanon Ace, RN Outcome: Progressing 03/18/2020 1513 by Shanon Ace, RN Outcome: Progressing Goal: Ability to verbalize understanding of medication therapies will improve 03/18/2020 1516 by Shanon Ace, RN Outcome: Progressing 03/18/2020 1513 by Shanon Ace, RN Outcome: Progressing Goal: Individualized Educational Video(s) 03/18/2020 1516 by Shanon Ace, RN Outcome: Progressing 03/18/2020 1513 by Shanon Ace, RN Outcome: Progressing   Problem: Activity: Goal: Capacity to carry out activities will improve 03/18/2020 1516 by Shanon Ace, RN Outcome: Progressing 03/18/2020 1513 by Shanon Ace, RN Outcome: Progressing   Problem: Cardiac: Goal: Ability to achieve and maintain adequate cardiopulmonary perfusion will improve 03/18/2020 1516 by Shanon Ace, RN Outcome: Progressing 03/18/2020 1513 by Shanon Ace, RN Outcome: Progressing   Problem: Education: Goal: Knowledge of General Education information will improve Description: Including pain rating scale, medication(s)/side effects and non-pharmacologic comfort measures Outcome: Progressing   Problem: Health Behavior/Discharge Planning: Goal: Ability to manage health-related needs will improve Outcome: Progressing   Problem: Clinical Measurements: Goal: Ability to maintain clinical measurements within normal limits will improve Outcome: Progressing Goal: Will remain free from infection Outcome: Progressing Goal: Diagnostic test results will improve Outcome: Progressing Goal: Respiratory complications will improve Outcome: Progressing Goal: Cardiovascular complication will be avoided Outcome: Progressing   Problem: Activity: Goal: Risk for activity intolerance will decrease Outcome: Progressing   Problem: Nutrition: Goal: Adequate nutrition will be maintained Outcome: Progressing   Problem:  Coping: Goal: Level of anxiety will decrease Outcome: Progressing   Problem: Elimination: Goal: Will not experience complications related to bowel motility Outcome: Progressing Goal: Will not experience complications related to urinary retention Outcome: Progressing   Problem: Pain Managment: Goal: General experience of comfort will improve Outcome: Progressing   Problem: Safety: Goal: Ability to remain free from injury will improve Outcome: Not Progressing   Problem: Skin Integrity: Goal: Risk for impaired skin integrity will decrease Outcome: Not Progressing

## 2020-03-18 NOTE — Progress Notes (Signed)
Cardiology Progress Note  Patient ID: Kenneth Lyons MRN: [id number] DOB: 14-Jun-1941 Date of Encounter: 03/18/2020  Primary Cardiologist: No primary care provider on file.  Subjective   Chief Complaint: SOB  HPI: Still with SOB and cough. No CP. Answered questions for wife and daughter.   ROS:  All other ROS reviewed and negative. Pertinent positives noted in the HPI.     Inpatient Medications  Scheduled Meds: . alfuzosin  10 mg Oral Daily  . carvedilol  25 mg Oral BID WC  . escitalopram  10 mg Oral Daily  . furosemide  80 mg Intravenous BID  . insulin aspart  0-15 Units Subcutaneous TID AC & HS  . lisinopril  40 mg Oral Daily  . memantine  10 mg Oral BID  . pantoprazole (PROTONIX) IV  40 mg Intravenous Daily  . rosuvastatin  20 mg Oral q1800  . spironolactone  25 mg Oral Daily   Continuous Infusions: . ceFEPime (MAXIPIME) IV 200 mL/hr at 03/18/20 0900  . vancomycin 166.7 mL/hr at 03/18/20 0900   PRN Meds: acetaminophen **OR** acetaminophen, HYDROcodone-acetaminophen, ondansetron **OR** ondansetron (ZOFRAN) IV, polyethylene glycol   Vital Signs   Vitals:   03/17/20 2333 03/18/20 0352 03/18/20 0736 03/18/20 0807  BP: (!) 151/83 (!) 167/83 (!) 155/66   Pulse: 72 70 75 71  Resp: 15 16 18 20   Temp: 98 F (36.7 C) 98.4 F (36.9 C) 98 F (36.7 C)   TempSrc: Oral Oral Oral   SpO2: 93% 91% 92% 94%  Weight:      Height:        Intake/Output Summary (Last 24 hours) at 03/18/2020 1045 Last data filed at 03/18/2020 0900 Gross per 24 hour  Intake 1413.52 ml  Output 2200 ml  Net -786.48 ml   Last 3 Weights 03/17/2020 03/17/2020 03/14/2020  Weight (lbs) 189 lb 2.5 oz 185 lb 190 lb  Weight (kg) 85.8 kg 83.915 kg 86.183 kg      Telemetry  Overnight telemetry shows V paced rhythm 70s, which I personally reviewed.   ECG  The most recent ECG shows V-paced rhythm 70, which I personally reviewed.   Physical Exam   Vitals:   03/17/20 2333 03/18/20 0352 03/18/20 0736  03/18/20 0807  BP: (!) 151/83 (!) 167/83 (!) 155/66   Pulse: 72 70 75 71  Resp: 15 16 18 20   Temp: 98 F (36.7 C) 98.4 F (36.9 C) 98 F (36.7 C)   TempSrc: Oral Oral Oral   SpO2: 93% 91% 92% 94%  Weight:      Height:         Intake/Output Summary (Last 24 hours) at 03/18/2020 1045 Last data filed at 03/18/2020 0900 Gross per 24 hour  Intake 1413.52 ml  Output 2200 ml  Net -786.48 ml    Last 3 Weights 03/17/2020 03/17/2020 03/14/2020  Weight (lbs) 189 lb 2.5 oz 185 lb 190 lb  Weight (kg) 85.8 kg 83.915 kg 86.183 kg    Body mass index is 29.63 kg/m.   General: Well nourished, well developed, in no acute distress Head: Atraumatic, normal size  Eyes: PEERLA, EOMI  Neck: Supple, +JVD, +HJR Endocrine: No thryomegaly Cardiac: Normal S1, S2; RRR; 2/6 SEM Lungs: Rales Abd: Soft, nontender, no hepatomegaly  Ext: 2+ pitting edema  Musculoskeletal: No deformities, BUE and BLE strength normal and equal Skin: Warm and dry, no rashes   Neuro: Alert and oriented to person, place, time, and situation, CNII-XII grossly intact, no focal deficits  Psych: Normal  mood and affect   Labs  High Sensitivity Troponin:   Recent Labs  Lab 03/17/20 0217 03/17/20 0417 03/17/20 1416 03/17/20 1811  TROPONINIHS 1,194* 1,559* 1,657* 1,493*     Cardiac EnzymesNo results for input(s): TROPONINI in the last 168 hours. No results for input(s): TROPIPOC in the last 168 hours.  Chemistry Recent Labs  Lab 03/14/20 1517 03/14/20 1517 03/16/20 2253 03/16/20 2253 03/17/20 0137 03/17/20 0757 03/18/20 0224  NA 141   < > 141   < > 140 143 143  K 4.5   < > 5.0   < > 3.6 3.4* 3.6  CL 103   < > 103  --   --  104 100  CO2 26   < > 26  --   --  26 27  GLUCOSE 138*   < > 302*  --   --  234* 111*  BUN 30*   < > 36*  --   --  35* 35*  CREATININE 1.18  --  1.25*  --   --  1.13 1.16  CALCIUM 9.5   < > 9.7  --   --  9.5 9.2  PROT 7.1  --   --   --   --  6.5 6.4*  ALBUMIN 2.7*  --   --   --   --  2.5* 2.4*    AST 17  --   --   --   --  24 28  ALT 11  --   --   --   --  18 18  ALKPHOS 49  --   --   --   --  50 45  BILITOT 0.4  --   --   --   --  0.4 0.4  GFRNONAA 59*  --  55*  --   --  >60 >60  GFRAA >60  --  >60  --   --  >60 >60  ANIONGAP 12   < > 12  --   --  13 16*   < > = values in this interval not displayed.    Hematology Recent Labs  Lab 03/16/20 2253 03/16/20 2253 03/17/20 0137 03/17/20 0757 03/18/20 0224  WBC 13.3*  --   --  10.0 7.4  RBC 4.09*  --   --  3.89* 3.62*  HGB 10.1*   < > 11.6* 9.7* 8.8*  HCT 32.7*   < > 34.0* 30.9* 29.0*  MCV 80.0  --   --  79.4* 80.1  MCH 24.7*  --   --  24.9* 24.3*  MCHC 30.9  --   --  31.4 30.3  RDW 17.4*  --   --  17.7* 18.2*  PLT 654*  --   --  540* 471*   < > = values in this interval not displayed.   BNP Recent Labs  Lab 03/16/20 2250  BNP 512.5*    DDimer No results for input(s): DDIMER in the last 168 hours.   Radiology  DG Chest Port 1 View  Result Date: 03/17/2020 CLINICAL DATA:  Fevers EXAM: PORTABLE CHEST 1 VIEW COMPARISON:  03/16/2020 FINDINGS: Cardiac shadow is enlarged but stable. Pacing device is again seen. The patchy airspace opacity seen in the bases bilaterally have improved significantly although residual is noted primarily within the left lung base. No other focal abnormality is noted. No bony abnormality is seen. IMPRESSION: Significant improved aeration when compared with the prior exam. Residual left basilar opacity is noted. Electronically  Signed   By: Inez Catalina M.D.   On: 03/17/2020 09:16   DG Chest Port 1 View  Result Date: 03/16/2020 CLINICAL DATA:  79 year old male with shortness of breath. EXAM: PORTABLE CHEST 1 VIEW COMPARISON:  Chest radiograph dated 02/10/2020. FINDINGS: Bilateral lower lung field airspace densities concerning for developing infiltrate. Clinical correlation is recommended. Trace left pleural effusion may be present. No pneumothorax. Stable cardiomediastinal silhouette. Left pectoral  pacemaker device. No acute osseous pathology. IMPRESSION: Bilateral lower lung field airspace densities concerning for developing infiltrate. Clinical correlation and follow-up recommended. Electronically Signed   By: Anner Crete M.D.   On: 03/16/2020 23:11   ECHOCARDIOGRAM COMPLETE  Result Date: 03/17/2020    ECHOCARDIOGRAM REPORT   Patient Name:   CARNEL STEGMAN Date of Exam: 03/17/2020 Medical Rec #:  [id number]       Height:       67.0 in Accession #:    [id number]      Weight:       189.2 lb Date of Birth:  30-Jun-1941      BSA:          1.975 m Patient Age:    4 years        BP:           131/70 mmHg Patient Gender: M               HR:           78 bpm. Exam Location:  Inpatient Procedure: 2D Echo, Color Doppler, Cardiac Doppler and Intracardiac            Opacification Agent Indications:    NSTEMI  History:        Patient has no prior history of Echocardiogram examinations.                 CHF, NSTEMI, Pacemaker; Risk Factors:Hypertension, Diabetes and                 Dyslipidemia.  Sonographer:    Raquel Sarna Senior RDCS Referring Phys: [id number] [place]  1. Definity used; anteroseptal and apical akinesis with overall moderate LV dysfunction; grade 1 diastolic dysfunction; mild LVH; mild AS (mean gradient 11 mmHg); mild MR; moderate LAE.  2. Left ventricular ejection fraction, by estimation, is 35 to 40%. The left ventricle has moderately decreased function. The left ventricle demonstrates regional wall motion abnormalities (see scoring diagram/findings for description). There is mild left ventricular hypertrophy. Left ventricular diastolic parameters are consistent with Grade I diastolic dysfunction (impaired relaxation). Elevated left atrial pressure.  3. Right ventricular systolic function is normal. The right ventricular size is normal. There is moderately elevated pulmonary artery systolic pressure.  4. Left atrial size was moderately dilated.  5. The mitral valve is normal in  structure. Mild mitral valve regurgitation. No evidence of mitral stenosis.  6. The aortic valve is tricuspid. Aortic valve regurgitation is not visualized. Mild aortic valve stenosis.  7. The inferior vena cava is dilated in size with <50% respiratory variability, suggesting right atrial pressure of 15 mmHg. FINDINGS  Left Ventricle: Left ventricular ejection fraction, by estimation, is 35 to 40%. The left ventricle has moderately decreased function. The left ventricle demonstrates regional wall motion abnormalities. Definity contrast agent was given IV to delineate the left ventricular endocardial borders. The left ventricular internal cavity size was normal in size. There is mild left ventricular hypertrophy. Left ventricular diastolic parameters are consistent with Grade I diastolic dysfunction (impaired relaxation). Elevated  left atrial pressure. Right Ventricle: The right ventricular size is normal. Right ventricular systolic function is normal. There is moderately elevated pulmonary artery systolic pressure. The tricuspid regurgitant velocity is 2.89 m/s, and with an assumed right atrial pressure of 15 mmHg, the estimated right ventricular systolic pressure is 67.2 mmHg. Left Atrium: Left atrial size was moderately dilated. Right Atrium: Right atrial size was normal in size. Pericardium: There is no evidence of pericardial effusion. Mitral Valve: The mitral valve is normal in structure. Normal mobility of the mitral valve leaflets. Mild mitral annular calcification. Mild mitral valve regurgitation. No evidence of mitral valve stenosis. Tricuspid Valve: The tricuspid valve is normal in structure. Tricuspid valve regurgitation is mild . No evidence of tricuspid stenosis. Aortic Valve: The aortic valve is tricuspid. Aortic valve regurgitation is not visualized. Mild aortic stenosis is present. Pulmonic Valve: The pulmonic valve was not well visualized. Pulmonic valve regurgitation is not visualized. No evidence  of pulmonic stenosis. Aorta: The aortic root is normal in size and structure. Venous: The inferior vena cava is dilated in size with less than 50% respiratory variability, suggesting right atrial pressure of 15 mmHg. IAS/Shunts: No atrial level shunt detected by color flow Doppler. Additional Comments: Definity used; anteroseptal and apical akinesis with overall moderate LV dysfunction; grade 1 diastolic dysfunction; mild LVH; mild AS (mean gradient 11 mmHg); mild MR; moderate LAE. A pacer wire is visualized.  LEFT VENTRICLE PLAX 2D LVIDd:         3.90 cm  Diastology LVIDs:         2.60 cm  LV e' lateral:   5.00 cm/s LV PW:         1.20 cm  LV E/e' lateral: 18.3 LV IVS:        1.20 cm  LV e' medial:    4.24 cm/s LVOT diam:     1.90 cm  LV E/e' medial:  21.5 LV SV:         67 LV SV Index:   34 LVOT Area:     2.84 cm  RIGHT VENTRICLE RV S prime:     9.25 cm/s TAPSE (M-mode): 1.9 cm LEFT ATRIUM             Index       RIGHT ATRIUM           Index LA diam:        4.30 cm 2.18 cm/m  RA Area:     19.10 cm LA Vol (A2C):   80.8 ml 40.91 ml/m RA Volume:   53.90 ml  27.29 ml/m LA Vol (A4C):   99.6 ml 50.43 ml/m LA Biplane Vol: 94.2 ml 47.70 ml/m  AORTIC VALVE LVOT Vmax:   106.00 cm/s LVOT Vmean:  79.900 cm/s LVOT VTI:    0.236 m MITRAL VALVE                TRICUSPID VALVE MV Area (PHT): 3.77 cm     TR Peak grad:   33.4 mmHg MV Decel Time: 201 msec     TR Vmax:        289.00 cm/s MV E velocity: 91.30 cm/s MV A velocity: 128.00 cm/s  SHUNTS MV E/A ratio:  0.71         Systemic VTI:  0.24 m                             Systemic Diam: 1.90 cm Kirk Ruths  MD Electronically signed by Kirk Ruths MD Signature Date/Time: 03/17/2020/1:45:05 PM    Final     Cardiac Studies  TTE 03/17/2020 1. Definity used; anteroseptal and apical akinesis with overall moderate  LV dysfunction; grade 1 diastolic dysfunction; mild LVH; mild AS (mean  gradient 11 mmHg); mild MR; moderate LAE.  2. Left ventricular ejection fraction, by  estimation, is 35 to 40%. The  left ventricle has moderately decreased function. The left ventricle  demonstrates regional wall motion abnormalities (see scoring  diagram/findings for description). There is mild  left ventricular hypertrophy. Left ventricular diastolic parameters are  consistent with Grade I diastolic dysfunction (impaired relaxation).  Elevated left atrial pressure.  3. Right ventricular systolic function is normal. The right ventricular  size is normal. There is moderately elevated pulmonary artery systolic  pressure.  4. Left atrial size was moderately dilated.  5. The mitral valve is normal in structure. Mild mitral valve  regurgitation. No evidence of mitral stenosis.  6. The aortic valve is tricuspid. Aortic valve regurgitation is not  visualized. Mild aortic valve stenosis.  7. The inferior vena cava is dilated in size with <50% respiratory  variability, suggesting right atrial pressure of 15 mmHg.   Patient Profile  Kenneth Lyons is a 79 y.o. male with CHB s/p ppm 2013, metastatic RCC, HTN, DM, mild AS, dementia (4 years) admitted 03/17/2020 for volume overload. Found to have NSTEMI and EF drop.   Assessment & Plan   1. Acute Systolic HF, 80-99%, with WMA/NSTEMI/Anemia -received pRBCs for anemia prior to starting chemotherapy. Was already having LE edema as outpatient prior to this. Found to have EF 35-40% with anteroseptal/apical akinesis. Could be ppm vs infarct. Troponin peak 1657 and trending down. No CP.  -anemia an issue here. Hgb 6.2 on 5/11. No source. Was up to 11.6 with transfusion but now down to 8.8. He was treated medically for NSTEMI including heparin. He was on plavix as outpatient due to prior CVA.  -it appears he may have been developing HF as outpatient with LE edema prior to admission. Transfusion probably led to circulatory overload. Unclear. Now with EF reduction and WMA.  -Hgb keeps dropping. Stop ASA/plavix/heparin. He has no CP which  is probably more important. Not cath candidate with anemia that has not been corrected. He also is an elderly man with dementia recently diagnosed with metastatic RCC. He is not active. Mainly sits around the house per report from his wife. Medical management (asa/plavix/48 hour heparin) would have been the recommendation, but due to anemia, we cannot pursue this.  -we will optimize HF; lasix 80 mg BID. Still very volume up.  -continue coreg, lisinopril and aldactone Could consider switch to entresto but for now will continue home eds  -I discussed overall diagnosis and plan with his wife Raquon Milledge at the bedside  For questions or updates, please contact [place] Please consult [email or web address] for contact info under   Time Spent with Patient: I have spent a total of 25 minutes with patient reviewing hospital notes, telemetry, EKGs, labs and examining the patient as well as establishing an assessment and plan that was discussed with the patient.  > 50% of time was spent in direct patient care.    Signed, Addison Naegeli. Audie Box, [place]  03/18/2020 10:45 AM

## 2020-03-18 NOTE — Progress Notes (Addendum)
PROGRESS NOTE        PATIENT DETAILS Name: Kenneth Lyons Age: 79 y.o. Sex: male Date of Birth: 11/10/1940 Admit Date: 03/16/2020 Admitting Physician [name], MD WGN:[id number], Barbarann Ehlers, DO  Brief Narrative: Patient is a 79 y.o. male with recent diagnosis of metastatic renal cell carcinoma, anemia related to underlying cancer-who was given 2 units of PRBC at the outpatient infusion center-following which patient developed acute respiratory distress-was brought to the emergency room-found to be hypoxemic requiring BiPAP support.  Given intravenous Lasix-clinically improved-and was titrated to nasal cannula.  See below for further details.  Significant events: 5/28>> admit to [facility] for severe hypoxemia requiring BiPAP after 2 units of PRBC transfusion.  Significant studies: 5/29: TTE>> EF 86-57%, grade 1 diastolic dysfunction, anteroseptal/apical akinesis  Antimicrobial therapy: Vancomycin: 5/28>> 5/30 Cefepime : 5/28>>  Microbiology data: 5/28: Blood culture>> no growth  Procedures : None  Consults: Cards  DVT Prophylaxis : SCD's given worsening anemia  Subjective: Feels better-remains on room air-still somewhat pleasantly confused.  Assessment/Plan: Acute hypoxic respiratory failure: Multifactorial etiology secondary to decompensated systolic heart failure, PNA.  Continue diuretics and IV antimicrobial therapy.  Newly diagnosed decompensated systolic heart failure (EF 35-40% by TTE on 5/29): Although hypoxia has resolved-remains volume overloaded.  Continue intravenous furosemide, Coreg, lisinopril and Aldactone.  Appreciate cardiology input.  Non-STEMI: Initially thought to have demand ischemia but upon further evaluation with echo demonstrating regional wall motion abnormalities/reduced EF-now thought to have non-STEMI.  Initially medically managed but due to lack of chest pain/dropping hemoglobin-not on heparin/antiplatelet agents.   Agree with cardiology-given metastatic renal cell carcinoma-dementia-anemia-best managed medically-without pursuing LHC.  Metastatic renal cell carcinoma: Continue outpatient follow-up with Dr. Marin Olp  Anemia: Appears to be microcytic-Per outpatient oncology note-related to underlying malignancy.  Patient was transfused 2 units of PRBC at the outpatient infusion center on 5/28-hemoglobin slowly dropping-around 8.8.  No overt history of melena/hematochezia.  Continue to follow-check FOBT-we will discuss with patient's primary oncologist tomorrow.  History of CVA: [name]-history of Alzheimer's-but appears nonfocal.  Antiplatelet agents stopped due to worsening anemia.  History of complete heart block: PPM in place-telemetry monitoring  HTN: BP controlled-continue amlodipine, alfuzosin, Coreg, and lisinopril  DM 2: Follow CBGs-continue SSI  Recent Labs    03/17/20 2125 03/18/20 0619 03/18/20 1118  GLUCAP 208* 122* 199*   HLD: Continue statin  History of Alzheimer's dementia: [name]-suspect not far from usual baseline.  Remains on Namenda  Chronic pain syndrome: Has cancer related chronic pain-resume transdermal fentanyl, Neurontin.  Follow.  Goals of care: DNR in place-demented patient with stage IV renal cell carcinoma-now with newly diagnosed decompensated heart failure/non-STEMI-we will need to touch base with Dr. Marin Olp on 5/31-regarding whether patient is still a candidate for chemo/immunotherapy.  Long discussion with spouse this morning-she is aware of the complexities that have developed this admission that may hamper treatment of his underlying malignancy.  Diet: Diet Order            Diet heart healthy/carb modified Room service appropriate? Yes; Fluid consistency: Thin  Diet effective now              Code Status: DNR  Family Communication: Spouse over the [phone] on 5/30  Disposition Plan: Status is: Inpatient  Remains  inpatient appropriate because:Inpatient level of care appropriate due to severity of illness   Dispo: The  patient is from: Home              Anticipated d/c is to: Home              Anticipated d/c date is: 2 days              Patient currently is not medically stable to d/c.  Barriers to Discharge: Respiratory failure with hypoxia-ruling out pneumonia-with volume overload requiring IV Lasix.  Significantly elevated troponin-awaiting formal cardiology evaluation.  Antimicrobial agents: Anti-infectives (From admission, onward)   Start     Dose/Rate Route Frequency Ordered Stop   03/18/20 0400  vancomycin (VANCOREADY) IVPB 1250 mg/250 mL     1,250 mg 166.7 mL/hr over 90 Minutes Intravenous Every 24 hours 03/17/20 0529     03/17/20 0600  ceFEPIme (MAXIPIME) 2 g in sodium chloride 0.9 % 100 mL IVPB     2 g 200 mL/hr over 30 Minutes Intravenous Every 12 hours 03/17/20 0529     03/17/20 0130  cefTRIAXone (ROCEPHIN) 1 g in sodium chloride 0.9 % 100 mL IVPB     1 g 200 mL/hr over 30 Minutes Intravenous  Once 03/17/20 0121 03/17/20 0238   03/17/20 0130  azithromycin (ZITHROMAX) 500 mg in sodium chloride 0.9 % 250 mL IVPB     500 mg 250 mL/hr over 60 Minutes Intravenous  Once 03/17/20 0121 03/17/20 0345   03/17/20 0130  vancomycin (VANCOREADY) IVPB 1500 mg/300 mL     1,500 mg 150 mL/hr over 120 Minutes Intravenous  Once 03/17/20 0124 03/17/20 0552       Time spent: 25 minutes-Greater than 50% of this time was spent in counseling, explanation of diagnosis, planning of further management, and coordination of care.  MEDICATIONS: Scheduled Meds: . alfuzosin  10 mg Oral Daily  . carvedilol  25 mg Oral BID WC  . escitalopram  10 mg Oral Daily  . furosemide  80 mg Intravenous BID  . insulin aspart  0-15 Units Subcutaneous TID AC & HS  . lisinopril  40 mg Oral Daily  . memantine  10 mg Oral BID  . pantoprazole (PROTONIX) IV  40 mg Intravenous Daily  . rosuvastatin  20 mg Oral q1800  .  spironolactone  25 mg Oral Daily   Continuous Infusions: . ceFEPime (MAXIPIME) IV 200 mL/hr at 03/18/20 0900  . vancomycin 166.7 mL/hr at 03/18/20 0900   PRN Meds:.acetaminophen **OR** acetaminophen, HYDROcodone-acetaminophen, ondansetron **OR** ondansetron (ZOFRAN) IV, polyethylene glycol   PHYSICAL EXAM: Vital signs: Vitals:   03/17/20 2333 03/18/20 0352 03/18/20 0736 03/18/20 0807  BP: (!) 151/83 (!) 167/83 (!) 155/66   Pulse: 72 70 75 71  Resp: 15 16 18 20   Temp: 98 F (36.7 C) 98.4 F (36.9 C) 98 F (36.7 C)   TempSrc: Oral Oral Oral   SpO2: 93% 91% 92% 94%  Weight:      Height:       Filed Weights   03/17/20 0113 03/17/20 0600  Weight: 83.9 kg 85.8 kg   Body mass index is 29.63 kg/m.   Gen Exam: [name]-not in any distress HEENT:atraumatic, normocephalic Chest: B/L clear to auscultation anteriorly CVS:S1S2 regular Abdomen:soft non tender, non distended Extremities:+ edema Neurology: Non focal Skin: no rash  I have personally reviewed following labs and imaging studies  LABORATORY DATA: CBC: Recent Labs  Lab 03/14/20 1517 03/16/20 2253 03/17/20 0137 03/17/20 0757 03/18/20 0224  WBC 8.3 13.3*  --  10.0 7.4  NEUTROABS 6.9 10.9*  --  8.8*  --   HGB 7.4* 10.1* 11.6* 9.7* 8.8*  HCT 24.8* 32.7* 34.0* 30.9* 29.0*  MCV 79.0* 80.0  --  79.4* 80.1  PLT 484* 654*  --  540* 471*    Basic Metabolic Panel: Recent Labs  Lab 03/14/20 1517 03/16/20 2253 03/17/20 0137 03/17/20 0757 03/18/20 0224  NA 141 141 140 143 143  K 4.5 5.0 3.6 3.4* 3.6  CL 103 103  --  104 100  CO2 26 26  --  26 27  GLUCOSE 138* 302*  --  234* 111*  BUN 30* 36*  --  35* 35*  CREATININE 1.18 1.25*  --  1.13 1.16  CALCIUM 9.5 9.7  --  9.5 9.2  MG  --   --   --  2.0  --     GFR: Estimated Creatinine Clearance: 55.8 mL/min (by C-G formula based on SCr of 1.16 mg/dL).  Liver Function Tests: Recent Labs  Lab 03/14/20 1517 03/17/20 0757 03/18/20 0224  AST 17 24 28    ALT 11 18 18   ALKPHOS 49 50 45  BILITOT 0.4 0.4 0.4  PROT 7.1 6.5 6.4*  ALBUMIN 2.7* 2.5* 2.4*   No results for input(s): LIPASE, AMYLASE in the last 168 hours. No results for input(s): AMMONIA in the last 168 hours.  Coagulation Profile: Recent Labs  Lab 03/17/20 0757  INR 1.4*    Cardiac Enzymes: No results for input(s): CKTOTAL, CKMB, CKMBINDEX, TROPONINI in the last 168 hours.  BNP (last 3 results) No results for input(s): PROBNP in the last 8760 hours.  Lipid Profile: No results for input(s): CHOL, HDL, LDLCALC, TRIG, CHOLHDL, LDLDIRECT in the last 72 hours.  Thyroid Function Tests: No results for input(s): TSH, T4TOTAL, FREET4, T3FREE, THYROIDAB in the last 72 hours.  Anemia Panel: Recent Labs    03/17/20 0757  VITAMINB12 1,633*  FOLATE 28.5  TIBC 321  IRON 16*    Urine analysis:    Component Value Date/Time   COLORURINE YELLOW 10/30/2019 1424   APPEARANCEUR CLEAR 10/30/2019 1424   LABSPEC 1.015 10/30/2019 1424   PHURINE 6.5 10/30/2019 1424   GLUCOSEU >=500 (A) 10/30/2019 1424   HGBUR NEGATIVE 10/30/2019 [place] 10/30/2019 1424   KETONESUR NEGATIVE 10/30/2019 1424   PROTEINUR NEGATIVE 10/30/2019 1424   UROBILINOGEN 0.2 07/06/2012 2042   NITRITE NEGATIVE 10/30/2019 1424   LEUKOCYTESUR NEGATIVE 10/30/2019 1424    Sepsis Labs: Lactic Acid, Venous    Component Value Date/Time   LATICACIDVEN 1.1 03/17/2020 0422    MICROBIOLOGY: Recent Results (from the past 240 hour(s))  Blood culture (routine x 2)     Status: None (Preliminary result)   Collection Time: 03/16/20  2:17 AM   Specimen: BLOOD  Result Value Ref Range Status   Specimen Description BLOOD SITE NOT SPECIFIED  Final   Special Requests   Final    BOTTLES DRAWN AEROBIC AND ANAEROBIC Blood Culture results may not be optimal due to an excessive volume of blood received in culture bottles   Culture   Final    NO GROWTH 1 DAY Performed at [facility], [place] [address] [place], [place] [address]    Report Status PENDING  Incomplete  Blood culture (routine x 2)     Status: None (Preliminary result)   Collection Time: 03/17/20 12:47 AM   Specimen: BLOOD  Result Value Ref Range Status   Specimen Description BLOOD LEFT ANTECUBITAL  Final   Special Requests   Final  BOTTLES DRAWN AEROBIC AND ANAEROBIC Blood Culture results may not be optimal due to an excessive volume of blood received in culture bottles   Culture   Final    NO GROWTH 1 DAY Performed at [facility] [address], [place], [place] [address]    Report Status PENDING  Incomplete  SARS Coronavirus 2 by RT PCR (hospital order, performed in [facility] hospital lab) Nasopharyngeal Nasopharyngeal Swab     Status: None   Collection Time: 03/17/20 12:53 AM   Specimen: Nasopharyngeal Swab  Result Value Ref Range Status   SARS Coronavirus 2 NEGATIVE NEGATIVE Final    Comment: (NOTE) SARS-CoV-2 target nucleic acids are NOT DETECTED. The SARS-CoV-2 RNA is generally detectable in upper and lower respiratory specimens during the acute phase of infection. The lowest concentration of SARS-CoV-2 viral copies this assay can detect is 250 copies / mL. A negative result does not preclude SARS-CoV-2 infection and should not be used as the sole basis for treatment or other patient management decisions.  A negative result may occur with improper specimen collection / handling, submission of specimen other than nasopharyngeal swab, presence of viral mutation(s) within the areas targeted by this assay, and inadequate number of viral copies (<250 copies / mL). A negative result must be combined with clinical observations, patient history, and epidemiological information. Fact Sheet for Patients:   StrictlyIdeas.no Fact Sheet for Healthcare Providers: BankingDealers.co.za This test is not yet approved or cleared  by the [place] FDA and has  been authorized for detection and/or diagnosis of SARS-CoV-2 by FDA under an Emergency Use Authorization (EUA).  This EUA will remain in effect (meaning this test can be used) for the duration of the COVID-19 declaration under Section 564(b)(1) of the Act, 21 U.S.C. section 360bbb-3(b)(1), unless the authorization is terminated or revoked sooner. Performed at [facility], [place] [address], [place], [place] [address]   MRSA PCR Screening     Status: None   Collection Time: 03/17/20  8:05 AM   Specimen: Nasopharyngeal  Result Value Ref Range Status   MRSA by PCR NEGATIVE NEGATIVE Final    Comment:        The GeneXpert MRSA Assay (FDA approved for NASAL specimens only), is one component of a comprehensive MRSA colonization surveillance program. It is not intended to diagnose MRSA infection nor to guide or monitor treatment for MRSA infections. Performed at [facility], [place] [address] [place], [place] [address]     RADIOLOGY STUDIES/RESULTS: DG Chest Port 1 View  Result Date: 03/17/2020 CLINICAL DATA:  Fevers EXAM: PORTABLE CHEST 1 VIEW COMPARISON:  03/16/2020 FINDINGS: Cardiac shadow is enlarged but stable. Pacing device is again seen. The patchy airspace opacity seen in the bases bilaterally have improved significantly although residual is noted primarily within the left lung base. No other focal abnormality is noted. No bony abnormality is seen. IMPRESSION: Significant improved aeration when compared with the prior exam. Residual left basilar opacity is noted. Electronically Signed   By: Inez Catalina M.D.   On: 03/17/2020 09:16   DG Chest Port 1 View  Result Date: 03/16/2020 CLINICAL DATA:  79 year old male with shortness of breath. EXAM: PORTABLE CHEST 1 VIEW COMPARISON:  Chest radiograph dated 02/10/2020. FINDINGS: Bilateral lower lung field airspace densities concerning for developing infiltrate. Clinical correlation is recommended. Trace left pleural effusion may be  present. No pneumothorax. Stable cardiomediastinal silhouette. Left pectoral pacemaker device. No acute osseous pathology. IMPRESSION: Bilateral lower lung field  airspace densities concerning for developing infiltrate. Clinical correlation and follow-up recommended. Electronically Signed   By: Anner Crete M.D.   On: 03/16/2020 23:11   ECHOCARDIOGRAM COMPLETE  Result Date: 03/17/2020    ECHOCARDIOGRAM REPORT   Patient Name:   AGOSTINO GORIN Date of Exam: 03/17/2020 Medical Rec #:  [id number]       Height:       67.0 in Accession #:    [id number]      Weight:       189.2 lb Date of Birth:  1941/06/11      BSA:          1.975 m Patient Age:    67 years        BP:           131/70 mmHg Patient Gender: M               HR:           78 bpm. Exam Location:  Inpatient Procedure: 2D Echo, Color Doppler, Cardiac Doppler and Intracardiac            Opacification Agent Indications:    NSTEMI  History:        Patient has no prior history of Echocardiogram examinations.                 CHF, NSTEMI, Pacemaker; Risk Factors:Hypertension, Diabetes and                 Dyslipidemia.  Sonographer:    Raquel Sarna Senior RDCS Referring Phys: [id number] [place]  1. Definity used; anteroseptal and apical akinesis with overall moderate LV dysfunction; grade 1 diastolic dysfunction; mild LVH; mild AS (mean gradient 11 mmHg); mild MR; moderate LAE.  2. Left ventricular ejection fraction, by estimation, is 35 to 40%. The left ventricle has moderately decreased function. The left ventricle demonstrates regional wall motion abnormalities (see scoring diagram/findings for description). There is mild left ventricular hypertrophy. Left ventricular diastolic parameters are consistent with Grade I diastolic dysfunction (impaired relaxation). Elevated left atrial pressure.  3. Right ventricular systolic function is normal. The right ventricular size is normal. There is moderately elevated pulmonary artery systolic pressure.  4.  Left atrial size was moderately dilated.  5. The mitral valve is normal in structure. Mild mitral valve regurgitation. No evidence of mitral stenosis.  6. The aortic valve is tricuspid. Aortic valve regurgitation is not visualized. Mild aortic valve stenosis.  7. The inferior vena cava is dilated in size with <50% respiratory variability, suggesting right atrial pressure of 15 mmHg. FINDINGS  Left Ventricle: Left ventricular ejection fraction, by estimation, is 35 to 40%. The left ventricle has moderately decreased function. The left ventricle demonstrates regional wall motion abnormalities. Definity contrast agent was given IV to delineate the left ventricular endocardial borders. The left ventricular internal cavity size was normal in size. There is mild left ventricular hypertrophy. Left ventricular diastolic parameters are consistent with Grade I diastolic dysfunction (impaired relaxation). Elevated left atrial pressure. Right Ventricle: The right ventricular size is normal. Right ventricular systolic function is normal. There is moderately elevated pulmonary artery systolic pressure. The tricuspid regurgitant velocity is 2.89 m/s, and with an assumed right atrial pressure of 15 mmHg, the estimated right ventricular systolic pressure is 65.4 mmHg. Left Atrium: Left atrial size was moderately dilated. Right Atrium: Right atrial size was normal in size. Pericardium: There is no evidence of pericardial effusion. Mitral Valve: The mitral valve is normal in structure.  Normal mobility of the mitral valve leaflets. Mild mitral annular calcification. Mild mitral valve regurgitation. No evidence of mitral valve stenosis. Tricuspid Valve: The tricuspid valve is normal in structure. Tricuspid valve regurgitation is mild . No evidence of tricuspid stenosis. Aortic Valve: The aortic valve is tricuspid. Aortic valve regurgitation is not visualized. Mild aortic stenosis is present. Pulmonic Valve: The pulmonic valve was not  well visualized. Pulmonic valve regurgitation is not visualized. No evidence of pulmonic stenosis. Aorta: The aortic root is normal in size and structure. Venous: The inferior vena cava is dilated in size with less than 50% respiratory variability, suggesting right atrial pressure of 15 mmHg. IAS/Shunts: No atrial level shunt detected by color flow Doppler. Additional Comments: Definity used; anteroseptal and apical akinesis with overall moderate LV dysfunction; grade 1 diastolic dysfunction; mild LVH; mild AS (mean gradient 11 mmHg); mild MR; moderate LAE. A pacer wire is visualized.  LEFT VENTRICLE PLAX 2D LVIDd:         3.90 cm  Diastology LVIDs:         2.60 cm  LV e' lateral:   5.00 cm/s LV PW:         1.20 cm  LV E/e' lateral: 18.3 LV IVS:        1.20 cm  LV e' medial:    4.24 cm/s LVOT diam:     1.90 cm  LV E/e' medial:  21.5 LV SV:         67 LV SV Index:   34 LVOT Area:     2.84 cm  RIGHT VENTRICLE RV S prime:     9.25 cm/s TAPSE (M-mode): 1.9 cm LEFT ATRIUM             Index       RIGHT ATRIUM           Index LA diam:        4.30 cm 2.18 cm/m  RA Area:     19.10 cm LA Vol (A2C):   80.8 ml 40.91 ml/m RA Volume:   53.90 ml  27.29 ml/m LA Vol (A4C):   99.6 ml 50.43 ml/m LA Biplane Vol: 94.2 ml 47.70 ml/m  AORTIC VALVE LVOT Vmax:   106.00 cm/s LVOT Vmean:  79.900 cm/s LVOT VTI:    0.236 m MITRAL VALVE                TRICUSPID VALVE MV Area (PHT): 3.77 cm     TR Peak grad:   33.4 mmHg MV Decel Time: 201 msec     TR Vmax:        289.00 cm/s MV E velocity: 91.30 cm/s MV A velocity: 128.00 cm/s  SHUNTS MV E/A ratio:  0.71         Systemic VTI:  0.24 m                             Systemic Diam: 1.90 cm Kirk Ruths MD Electronically signed by Kirk Ruths MD Signature Date/Time: 03/17/2020/1:45:05 PM    Final      LOS: 1 day   Oren Binet, MD  [facility]    To contact the attending provider between 7A-7P or the covering provider during after hours 7P-7A, please log into the web site  [email or web address] and access using universal [place] password for that web site. If you do not have the password, please call the hospital operator.  03/18/2020, 11:31 AM

## 2020-03-19 LAB — GLUCOSE, CAPILLARY
Glucose-Capillary: 170 mg/dL — ABNORMAL HIGH (ref 70–99)
Glucose-Capillary: 217 mg/dL — ABNORMAL HIGH (ref 70–99)
Glucose-Capillary: 169 mg/dL — ABNORMAL HIGH (ref 70–99)
Glucose-Capillary: 190 mg/dL — ABNORMAL HIGH (ref 70–99)

## 2020-03-19 LAB — BASIC METABOLIC PANEL
Anion gap: 12 (ref 5–15)
BUN: 25 mg/dL — ABNORMAL HIGH (ref 8–23)
CO2: 26 mmol/L (ref 22–32)
Calcium: 8.8 mg/dL — ABNORMAL LOW (ref 8.9–10.3)
Chloride: 102 mmol/L (ref 98–111)
Creatinine, Ser: 1 mg/dL (ref 0.61–1.24)
GFR calc Af Amer: >60 mL/min (ref >60)
GFR calc non Af Amer: >60 mL/min (ref >60)
Glucose, Bld: 216 mg/dL — ABNORMAL HIGH (ref 70–99)
Potassium: 3.2 mmol/L — ABNORMAL LOW (ref 3.5–5.1)
Sodium: 140 mmol/L (ref 135–145)

## 2020-03-19 LAB — RETICULOCYTES
Immature Retic Fract: 32.7 % — ABNORMAL HIGH (ref 2.3–15.9)
RBC.: 3.75 MIL/uL — ABNORMAL LOW (ref 4.22–5.81)
Retic Count, Absolute: 74.3 10*3/uL (ref 19.0–186.0)
Retic Ct Pct: 2 % (ref 0.4–3.1)

## 2020-03-19 LAB — CBC
HCT: 30.5 % — ABNORMAL LOW (ref 39.0–52.0)
Hemoglobin: 9.4 g/dL — ABNORMAL LOW (ref 13.0–17.0)
MCH: 24.6 pg — ABNORMAL LOW (ref 26.0–34.0)
MCHC: 30.8 g/dL (ref 30.0–36.0)
MCV: 79.8 fL — ABNORMAL LOW (ref 80.0–100.0)
Platelets: 567 10*3/uL — ABNORMAL HIGH (ref 150–400)
RBC: 3.82 MIL/uL — ABNORMAL LOW (ref 4.22–5.81)
RDW: 18.1 % — ABNORMAL HIGH (ref 11.5–15.5)
WBC: 8.4 10*3/uL (ref 4.0–10.5)
nRBC: 0 % (ref 0.0–0.2)

## 2020-03-19 MED ORDER — CEFAZOLIN SODIUM-DEXTROSE 2-4 GM/100ML-% IV SOLN: 2.0000 g | INTRAVENOUS | Status: DC

## 2020-03-19 MED ORDER — FUROSEMIDE 10 MG/ML IJ SOLN
100.0000 mg | Freq: Two times a day (BID) | INTRAVENOUS | Status: AC
  Administered 2020-03-19 (×2): 100 mg via INTRAVENOUS
  Filled 2020-03-19 (×2): qty 10

## 2020-03-19 MED ORDER — SODIUM CHLORIDE 0.9 % IV SOLN: INTRAVENOUS | Status: DC

## 2020-03-19 MED ORDER — POTASSIUM CHLORIDE CRYS ER 20 MEQ PO TBCR
40.0000 meq | EXTENDED_RELEASE_TABLET | Freq: Once | ORAL | Status: AC
  Administered 2020-03-19: 40 meq via ORAL
  Filled 2020-03-19: qty 2

## 2020-03-19 MED ORDER — SODIUM CHLORIDE 0.9 % IV SOLN
510.0000 mg | Freq: Once | INTRAVENOUS | Status: AC
  Administered 2020-03-19: 510 mg via INTRAVENOUS
  Filled 2020-03-19: qty 17

## 2020-03-19 NOTE — Progress Notes (Signed)
Pts order for BIPAP is PRN. Pt respiratory status is stable at this time on RA w/no distress noted. RT will continue to monitor.

## 2020-03-19 NOTE — Evaluation (Signed)
Occupational Therapy Evaluation Patient Details Name: Kenneth Lyons MRN: [id number] DOB: 09/10/1941 Today's Date: 03/19/2020    History of Present Illness Pt adm with acute respiratory distress and receiving 2U of PRBC. Pt required bipap.  Pt with recent diagnosis of renal cell CA. PMH - CVA, HTN, DM, Alzheimers   Clinical Impression   Pt PTA: Pt living with spouse and pt reports independence with ADL and mobility. Pt currnetly    PT admitted with above dx. Pt currently with functional limitiations due to the deficits in poor memory, decreased activity tolerance and decreased ability to care for self. Pt performing ADL routine with supervisionA to modified independence. Pt repots not using AD, but pt appears more steady with RW in room. Pt did not require physical assist; and appears continent. Pt will benefit from skilled OT to increase their independence and safety with ADL and balance to allow discharge home with 24/7 from spouse. OT following for acute needs.     Follow Up Recommendations  No OT follow up    Equipment Recommendations  None recommended by OT    Recommendations for Other Services       Precautions / Restrictions Precautions Precautions: Fall Restrictions Weight Bearing Restrictions: No      Mobility Bed Mobility Overal bed mobility: Needs Assistance Bed Mobility: Supine to Sit     Supine to sit: Supervision     General bed mobility comments: assist for lines  Transfers Overall transfer level: Needs assistance Equipment used: None Transfers: Sit to/from Stand Sit to Stand: Min guard         General transfer comment: Assist for lines/safety    Balance Overall balance assessment: Needs assistance Sitting-balance support: No upper extremity supported;Feet supported Sitting balance-Leahy Scale: Good     Standing balance support: No upper extremity supported;During functional activity Standing balance-Leahy Scale: Good                              ADL either performed or assessed with clinical judgement   ADL Overall ADL's : At baseline;Modified independent                                       General ADL Comments: Pt performing ADL routine with supervisionA to modified independence. Pt repots not using AD, but pt appears more steady with RW in room. Pt performing groomnig in standing and pericare with supervisionA. Pt did not require physical assist; and appears continent.     Vision Baseline Vision/History: No visual deficits Patient Visual Report: No change from baseline Vision Assessment?: No apparent visual deficits     Perception     Praxis      Pertinent Vitals/Pain Pain Assessment: No/denies pain     Hand Dominance Right   Extremity/Trunk Assessment Upper Extremity Assessment Upper Extremity Assessment: Overall WFL for tasks assessed   Lower Extremity Assessment Lower Extremity Assessment: Generalized weakness   Cervical / Trunk Assessment Cervical / Trunk Assessment: Normal   Communication Communication Communication: No difficulties   Cognition Arousal/Alertness: Awake/alert Behavior During Therapy: WFL for tasks assessed/performed Overall Cognitive Status: History of cognitive impairments - at baseline                                 General Comments: Pt able to follow all  simple commands; pt with short term memory deficits with poor recall   General Comments  VSS on RA    Exercises     Shoulder Instructions      Home Living Family/patient expects to be discharged to:: Private residence Living Arrangements: Spouse/significant other Available Help at Discharge: Family;Available 24 hours/day Type of Home: House Home Access: Stairs to enter [employer] of Steps: 2 Entrance Stairs-Rails: Right Home Layout: One level     Bathroom Shower/Tub: Occupational psychologist: Standard     Home Equipment: Environmental consultant - 4 wheels    Additional Comments: rollator was mother in laws      Prior Functioning/Environment Level of Independence: Independent                 OT Problem List: Decreased strength;Decreased activity tolerance;Decreased cognition;Decreased safety awareness;Impaired balance (sitting and/or standing)      OT Treatment/Interventions: Self-care/ADL training;Therapeutic exercise;Energy conservation;DME and/or AE instruction;Therapeutic activities;Patient/family education;Balance training;Cognitive remediation/compensation    OT Goals(Current goals can be found in the care plan section) Acute Rehab OT Goals Patient Stated Goal: go home OT Goal Formulation: With patient Time For Goal Achievement: 04/02/20 Potential to Achieve Goals: Good ADL Goals Pt Will Perform Lower Body Dressing: with supervision;sit to/from stand;sitting/lateral leans Pt/caregiver will Perform Home Exercise Program: Increased strength;Both right and left upper extremity;With Supervision Additional ADL Goal #1: pt will increase to perform x15 mins of ADL tasks with 1 seated rest break with supervisionA overall. Additional ADL Goal #2: Pt will utilize/recall 3 energy conservation techniques in order to return to PLOF.  OT Frequency: Min 2X/week   Barriers to D/C:            Co-evaluation              AM-PAC OT "6 Clicks" Daily Activity     Outcome Measure Help from another person eating meals?: None Help from another person taking care of personal grooming?: None Help from another person toileting, which includes using toliet, bedpan, or urinal?: None Help from another person bathing (including washing, rinsing, drying)?: A Little Help from another person to put on and taking off regular upper body clothing?: None Help from another person to put on and taking off regular lower body clothing?: A Little 6 Click Score: 22   End of Session Equipment Utilized During Treatment: Gait belt;Rolling walker Nurse  Communication: Mobility status  Activity Tolerance: Patient tolerated treatment well Patient left: in chair;with call bell/phone within reach;with chair alarm set  OT Visit Diagnosis: Unsteadiness on feet (R26.81);Muscle weakness (generalized) (M62.81)                Time: 8299-3716 OT Time Calculation (min): 29 min Charges:  OT General Charges $OT Visit: 1 Visit OT Evaluation $OT Eval Moderate Complexity: 1 Mod OT Treatments $Self Care/Home Management : 8-22 mins  Jefferey Pica, OTR/L Acute Rehabilitation Services Pager: [phone] Office: [phone]   Kayman Snuffer C 03/19/2020, 11:42 AM

## 2020-03-19 NOTE — Progress Notes (Signed)
Refused to get back to bed at this time.

## 2020-03-19 NOTE — Progress Notes (Signed)
PROGRESS NOTE        PATIENT DETAILS Name: Kenneth Lyons Age: 79 y.o. Sex: male Date of Birth: 07-14-1941 Admit Date: 03/16/2020 Admitting Physician Kenneth Emerald, MD PVV:[id number], Kenneth Ehlers, DO  Brief Narrative: Patient is a 79 y.o. male with recent diagnosis of metastatic renal cell carcinoma, anemia related to underlying cancer-who was given 2 units of PRBC at the outpatient infusion center-following which patient developed acute respiratory distress-was brought to the emergency room-found to be hypoxemic requiring BiPAP support.  Given intravenous Lasix-clinically improved-and was titrated to nasal cannula.  See below for further details.  Significant events: 5/28>> admit to [facility] for severe hypoxemia requiring BiPAP after 2 units of PRBC transfusion.  Significant studies: 5/29: TTE>> EF 78-67%, grade 1 diastolic dysfunction, anteroseptal/apical akinesis  Antimicrobial therapy: Vancomycin: 5/28>> 5/30 Cefepime : 5/28>>  Microbiology data: 5/28: Blood culture>> no growth  Procedures : None  Consults: Cards Oncology IR  DVT Prophylaxis : SCD's given worsening anemia  Subjective: Feels better-remains on room air-still somewhat pleasantly confused.  Assessment/Plan: Acute hypoxic respiratory failure: Multifactorial etiology secondary to decompensated systolic heart failure, PNA.  Continue diuretics and IV antimicrobial therapy.  Newly diagnosed decompensated systolic heart failure (EF 35-40% by TTE on 5/29): Although hypoxia has resolved-remains volume overloaded.  Continue intravenous furosemide, Coreg, lisinopril and Aldactone.  Appreciate cardiology input.  Non-STEMI: Initially thought to have demand ischemia but upon further evaluation with echo demonstrating regional wall motion abnormalities/reduced EF-now thought to have non-STEMI.  Initially medically managed but due to lack of chest pain/dropping hemoglobin-not on heparin/antiplatelet  agents.  Agree with cardiology-given metastatic renal cell carcinoma-dementia-anemia-best managed medically-without pursuing LHC.  Metastatic renal cell carcinoma: Appreciate Dr. Guadalupe Lyons input on 5/31-he has consulted IR for Port-A-Cath placement.  Anemia: Appears to be microcytic-Per outpatient oncology note-related to underlying malignancy-perhaps chronic blood loss but no history of overt melena/hematochezia.  Hemoglobin currently stable today-awaiting FOBT-oncology recommending IV iron-continue to follow CBC.  History of CVA: [name]-history of Alzheimer's-but appears nonfocal.  Antiplatelet agents stopped due to worsening anemia.  History of complete heart block: PPM in place-telemetry monitoring  HTN: BP controlled-continue amlodipine, alfuzosin, Coreg, and lisinopril  DM 2: Follow CBGs-continue SSI  Recent Labs    03/18/20 1639 03/18/20 2100 03/19/20 0629  GLUCAP 156* 202* 170*   HLD: Continue statin  History of Alzheimer's dementia: [name]-suspect not far from usual baseline.  Remains on Namenda  Chronic pain syndrome: Has cancer related chronic pain-resume transdermal fentanyl, Neurontin.  Follow.  Goals of care: DNR in place-demented patient with stage IV renal cell carcinoma-now with newly diagnosed decompensated heart failure/non-STEMI-oncology still feels that patient could have a decent response to immunotherapy/TKI inhibitor.  Will need continued engagement by oncology/palliative care services in the outpatient setting in case he does not respond well to treatment.   Diet: Diet Order            Diet NPO time specified Except for: Sips with Meds  Diet effective midnight        Diet heart healthy/carb modified Room service appropriate? Yes; Fluid consistency: Thin  Diet effective now              Code Status: DNR  Family Communication: Spouse over the [phone] on 5/31  Disposition Plan: Status is: Inpatient  Remains  inpatient appropriate because:Inpatient level of care appropriate due to severity of  illness   Dispo: The patient is from: Home              Anticipated d/c is to: Home              Anticipated d/c date is: 2 days              Patient currently is not medically stable to d/c.  Barriers to Discharge: Respiratory failure with hypoxia-ruling out pneumonia-with volume overload requiring IV Lasix.   Antimicrobial agents: Anti-infectives (From admission, onward)   Start     Dose/Rate Route Frequency Ordered Stop   03/20/20 0730  ceFAZolin (ANCEF) IVPB 2g/100 mL premix     2 g 200 mL/hr over 30 Minutes Intravenous To Radiology 03/19/20 0343 03/21/20 0730   03/18/20 0400  vancomycin (VANCOREADY) IVPB 1250 mg/250 mL  Status:  Discontinued     1,250 mg 166.7 mL/hr over 90 Minutes Intravenous Every 24 hours 03/17/20 0529 03/18/20 1153   03/17/20 0600  ceFEPIme (MAXIPIME) 2 g in sodium chloride 0.9 % 100 mL IVPB     2 g 200 mL/hr over 30 Minutes Intravenous Every 12 hours 03/17/20 0529     03/17/20 0130  cefTRIAXone (ROCEPHIN) 1 g in sodium chloride 0.9 % 100 mL IVPB     1 g 200 mL/hr over 30 Minutes Intravenous  Once 03/17/20 0121 03/17/20 0238   03/17/20 0130  azithromycin (ZITHROMAX) 500 mg in sodium chloride 0.9 % 250 mL IVPB     500 mg 250 mL/hr over 60 Minutes Intravenous  Once 03/17/20 0121 03/17/20 0345   03/17/20 0130  vancomycin (VANCOREADY) IVPB 1500 mg/300 mL     1,500 mg 150 mL/hr over 120 Minutes Intravenous  Once 03/17/20 0124 03/17/20 0552       Time spent: 25 minutes-Greater than 50% of this time was spent in counseling, explanation of diagnosis, planning of further management, and coordination of care.  MEDICATIONS: Scheduled Meds: . alfuzosin  10 mg Oral Daily  . carvedilol  25 mg Oral BID WC  . escitalopram  10 mg Oral Daily  . fentaNYL  1 patch Transdermal Q72H  . insulin aspart  0-15 Units Subcutaneous TID AC & HS  . lisinopril  40 mg Oral Daily  . memantine   10 mg Oral BID  . pantoprazole (PROTONIX) IV  40 mg Intravenous Daily  . polyethylene glycol  17 g Oral Daily  . potassium chloride  40 mEq Oral Once  . rosuvastatin  20 mg Oral q1800  . spironolactone  25 mg Oral Daily   Continuous Infusions: . [START ON 03/20/2020] sodium chloride 50 mL/hr at 03/19/20 0540  . [START ON 03/20/2020]  ceFAZolin (ANCEF) IV    . ceFEPime (MAXIPIME) IV 2 g (03/19/20 0527)  . ferumoxytol    . furosemide 100 mg (03/19/20 0937)   PRN Meds:.acetaminophen **OR** acetaminophen, gabapentin, HYDROcodone-acetaminophen, ondansetron **OR** ondansetron (ZOFRAN) IV   PHYSICAL EXAM: Vital signs: Vitals:   03/18/20 1935 03/18/20 2318 03/19/20 0411 03/19/20 0725  BP: (!) 159/72 (!) 158/75 (!) 154/75 (!) 163/74  Pulse: 70 70 73 71  Resp: 13 18 16 19   Temp: 98 F (36.7 C) 98.4 F (36.9 C) 98.2 F (36.8 C) 98 F (36.7 C)  TempSrc: Oral Oral Oral Oral  SpO2: 97% 98% 100% 94%  Weight:      Height:       Filed Weights   03/17/20 0113 03/17/20 0600  Weight: 83.9 kg 85.8 kg   Body mass  index is 29.63 kg/m.   Gen Exam: [name]-not in any distress HEENT:atraumatic, normocephalic Chest: B/L clear to auscultation anteriorly CVS:S1S2 regular Abdomen:soft non tender, non distended Extremities:+ edema Neurology: Non focal Skin: no rash  I have personally reviewed following labs and imaging studies  LABORATORY DATA: CBC: Recent Labs  Lab 03/14/20 1517 03/14/20 1517 03/16/20 2253 03/17/20 0137 03/17/20 0757 03/18/20 0224 03/19/20 0744  WBC 8.3  --  13.3*  --  10.0 7.4 8.4  NEUTROABS 6.9  --  10.9*  --  8.8*  --   --   HGB 7.4*  --  10.1* 11.6* 9.7* 8.8* 9.4*  HCT 24.8*   < > 32.7* 34.0* 30.9* 29.0* 30.5*  MCV 79.0*  --  80.0  --  79.4* 80.1 79.8*  PLT 484*  --  654*  --  540* 471* 567*   < > = values in this interval not displayed.    Basic Metabolic Panel: Recent Labs  Lab 03/14/20 1517 03/14/20 1517 03/16/20 2253 03/17/20 0137  03/17/20 0757 03/18/20 0224 03/19/20 0744  NA 141   < > 141 140 143 143 140  K 4.5   < > 5.0 3.6 3.4* 3.6 3.2*  CL 103  --  103  --  104 100 102  CO2 26  --  26  --  26 27 26   GLUCOSE 138*  --  302*  --  234* 111* 216*  BUN 30*  --  36*  --  35* 35* 25*  CREATININE 1.18  --  1.25*  --  1.13 1.16 1.00  CALCIUM 9.5  --  9.7  --  9.5 9.2 8.8*  MG  --   --   --   --  2.0  --   --    < > = values in this interval not displayed.    GFR: Estimated Creatinine Clearance: 64.8 mL/min (by C-G formula based on SCr of 1 mg/dL).  Liver Function Tests: Recent Labs  Lab 03/14/20 1517 03/17/20 0757 03/18/20 0224  AST 17 24 28   ALT 11 18 18   ALKPHOS 49 50 45  BILITOT 0.4 0.4 0.4  PROT 7.1 6.5 6.4*  ALBUMIN 2.7* 2.5* 2.4*   No results for input(s): LIPASE, AMYLASE in the last 168 hours. No results for input(s): AMMONIA in the last 168 hours.  Coagulation Profile: Recent Labs  Lab 03/17/20 0757  INR 1.4*    Cardiac Enzymes: No results for input(s): CKTOTAL, CKMB, CKMBINDEX, TROPONINI in the last 168 hours.  BNP (last 3 results) No results for input(s): PROBNP in the last 8760 hours.  Lipid Profile: No results for input(s): CHOL, HDL, LDLCALC, TRIG, CHOLHDL, LDLDIRECT in the last 72 hours.  Thyroid Function Tests: No results for input(s): TSH, T4TOTAL, FREET4, T3FREE, THYROIDAB in the last 72 hours.  Anemia Panel: Recent Labs    03/17/20 0757 03/19/20 0744  VITAMINB12 1,633*  --   FOLATE 28.5  --   TIBC 321  --   IRON 16*  --   RETICCTPCT  --  2.0    Urine analysis:    Component Value Date/Time   COLORURINE YELLOW 10/30/2019 1424   APPEARANCEUR CLEAR 10/30/2019 1424   LABSPEC 1.015 10/30/2019 1424   PHURINE 6.5 10/30/2019 1424   GLUCOSEU >=500 (A) 10/30/2019 1424   HGBUR NEGATIVE 10/30/2019 [place] 10/30/2019 [place] 10/30/2019 1424   PROTEINUR NEGATIVE 10/30/2019 1424   UROBILINOGEN 0.2 07/06/2012 2042   NITRITE  NEGATIVE  10/30/2019 1424   LEUKOCYTESUR NEGATIVE 10/30/2019 1424    Sepsis Labs: Lactic Acid, Venous    Component Value Date/Time   LATICACIDVEN 1.1 03/17/2020 0422    MICROBIOLOGY: Recent Results (from the past 240 hour(s))  Blood culture (routine x 2)     Status: None (Preliminary result)   Collection Time: 03/16/20  2:17 AM   Specimen: BLOOD  Result Value Ref Range Status   Specimen Description BLOOD SITE NOT SPECIFIED  Final   Special Requests   Final    BOTTLES DRAWN AEROBIC AND ANAEROBIC Blood Culture results may not be optimal due to an excessive volume of blood received in culture bottles   Culture   Final    NO GROWTH 2 DAYS Performed at [facility], [place] [address], [place], [place] [address]    Report Status PENDING  Incomplete  Blood culture (routine x 2)     Status: None (Preliminary result)   Collection Time: 03/17/20 12:47 AM   Specimen: BLOOD  Result Value Ref Range Status   Specimen Description BLOOD LEFT ANTECUBITAL  Final   Special Requests   Final    BOTTLES DRAWN AEROBIC AND ANAEROBIC Blood Culture results may not be optimal due to an excessive volume of blood received in culture bottles   Culture   Final    NO GROWTH 2 DAYS Performed at [facility], [place] [address], [place], [place] [address]    Report Status PENDING  Incomplete  SARS Coronavirus 2 by RT PCR (hospital order, performed in [facility] hospital lab) Nasopharyngeal Nasopharyngeal Swab     Status: None   Collection Time: 03/17/20 12:53 AM   Specimen: Nasopharyngeal Swab  Result Value Ref Range Status   SARS Coronavirus 2 NEGATIVE NEGATIVE Final    Comment: (NOTE) SARS-CoV-2 target nucleic acids are NOT DETECTED. The SARS-CoV-2 RNA is generally detectable in upper and lower respiratory specimens during the acute phase of infection. The lowest concentration of SARS-CoV-2 viral copies this assay can detect is 250 copies / mL. A negative result does not preclude SARS-CoV-2 infection and  should not be used as the sole basis for treatment or other patient management decisions.  A negative result may occur with improper specimen collection / handling, submission of specimen other than nasopharyngeal swab, presence of viral mutation(s) within the areas targeted by this assay, and inadequate number of viral copies (<250 copies / mL). A negative result must be combined with clinical observations, patient history, and epidemiological information. Fact Sheet for Patients:   StrictlyIdeas.no Fact Sheet for Healthcare Providers: BankingDealers.co.za This test is not yet approved or cleared  by the [place] FDA and has been authorized for detection and/or diagnosis of SARS-CoV-2 by FDA under an Emergency Use Authorization (EUA).  This EUA will remain in effect (meaning this test can be used) for the duration of the COVID-19 declaration under Section 564(b)(1) of the Act, 21 U.S.C. section 360bbb-3(b)(1), unless the authorization is terminated or revoked sooner. Performed at [facility], [place] [address], [place], [place] [address]   MRSA PCR Screening     Status: None   Collection Time: 03/17/20  8:05 AM   Specimen: Nasopharyngeal  Result Value Ref Range Status   MRSA by PCR NEGATIVE NEGATIVE Final    Comment:        The GeneXpert MRSA Assay (FDA approved for NASAL specimens only), is one component of a comprehensive MRSA colonization surveillance program. It is not intended  to diagnose MRSA infection nor to guide or monitor treatment for MRSA infections. Performed at [facility], [place] [address] [place], [place] [address]     RADIOLOGY STUDIES/RESULTS: ECHOCARDIOGRAM COMPLETE  Result Date: 03/17/2020    ECHOCARDIOGRAM REPORT   Patient Name:   MURPHY DUZAN Date of Exam: 03/17/2020 Medical Rec #:  [id number]       Height:       67.0 in Accession #:    [id number]      Weight:       189.2 lb Date of Birth:   1941-05-01      BSA:          1.975 m Patient Age:    66 years        BP:           131/70 mmHg Patient Gender: M               HR:           78 bpm. Exam Location:  Inpatient Procedure: 2D Echo, Color Doppler, Cardiac Doppler and Intracardiac            Opacification Agent Indications:    NSTEMI  History:        Patient has no prior history of Echocardiogram examinations.                 CHF, NSTEMI, Pacemaker; Risk Factors:Hypertension, Diabetes and                 Dyslipidemia.  Sonographer:    Raquel Sarna Senior RDCS Referring Phys: [id number] [place]  1. Definity used; anteroseptal and apical akinesis with overall moderate LV dysfunction; grade 1 diastolic dysfunction; mild LVH; mild AS (mean gradient 11 mmHg); mild MR; moderate LAE.  2. Left ventricular ejection fraction, by estimation, is 35 to 40%. The left ventricle has moderately decreased function. The left ventricle demonstrates regional wall motion abnormalities (see scoring diagram/findings for description). There is mild left ventricular hypertrophy. Left ventricular diastolic parameters are consistent with Grade I diastolic dysfunction (impaired relaxation). Elevated left atrial pressure.  3. Right ventricular systolic function is normal. The right ventricular size is normal. There is moderately elevated pulmonary artery systolic pressure.  4. Left atrial size was moderately dilated.  5. The mitral valve is normal in structure. Mild mitral valve regurgitation. No evidence of mitral stenosis.  6. The aortic valve is tricuspid. Aortic valve regurgitation is not visualized. Mild aortic valve stenosis.  7. The inferior vena cava is dilated in size with <50% respiratory variability, suggesting right atrial pressure of 15 mmHg. FINDINGS  Left Ventricle: Left ventricular ejection fraction, by estimation, is 35 to 40%. The left ventricle has moderately decreased function. The left ventricle demonstrates regional wall motion abnormalities.  Definity contrast agent was given IV to delineate the left ventricular endocardial borders. The left ventricular internal cavity size was normal in size. There is mild left ventricular hypertrophy. Left ventricular diastolic parameters are consistent with Grade I diastolic dysfunction (impaired relaxation). Elevated left atrial pressure. Right Ventricle: The right ventricular size is normal. Right ventricular systolic function is normal. There is moderately elevated pulmonary artery systolic pressure. The tricuspid regurgitant velocity is 2.89 m/s, and with an assumed right atrial pressure of 15 mmHg, the estimated right ventricular systolic pressure is 44.0 mmHg. Left Atrium: Left atrial size was moderately dilated. Right Atrium: Right atrial size was normal in size. Pericardium: There is no evidence of pericardial effusion. Mitral Valve: The  mitral valve is normal in structure. Normal mobility of the mitral valve leaflets. Mild mitral annular calcification. Mild mitral valve regurgitation. No evidence of mitral valve stenosis. Tricuspid Valve: The tricuspid valve is normal in structure. Tricuspid valve regurgitation is mild . No evidence of tricuspid stenosis. Aortic Valve: The aortic valve is tricuspid. Aortic valve regurgitation is not visualized. Mild aortic stenosis is present. Pulmonic Valve: The pulmonic valve was not well visualized. Pulmonic valve regurgitation is not visualized. No evidence of pulmonic stenosis. Aorta: The aortic root is normal in size and structure. Venous: The inferior vena cava is dilated in size with less than 50% respiratory variability, suggesting right atrial pressure of 15 mmHg. IAS/Shunts: No atrial level shunt detected by color flow Doppler. Additional Comments: Definity used; anteroseptal and apical akinesis with overall moderate LV dysfunction; grade 1 diastolic dysfunction; mild LVH; mild AS (mean gradient 11 mmHg); mild MR; moderate LAE. A pacer wire is visualized.  LEFT  VENTRICLE PLAX 2D LVIDd:         3.90 cm  Diastology LVIDs:         2.60 cm  LV e' lateral:   5.00 cm/s LV PW:         1.20 cm  LV E/e' lateral: 18.3 LV IVS:        1.20 cm  LV e' medial:    4.24 cm/s LVOT diam:     1.90 cm  LV E/e' medial:  21.5 LV SV:         67 LV SV Index:   34 LVOT Area:     2.84 cm  RIGHT VENTRICLE RV S prime:     9.25 cm/s TAPSE (M-mode): 1.9 cm LEFT ATRIUM             Index       RIGHT ATRIUM           Index LA diam:        4.30 cm 2.18 cm/m  RA Area:     19.10 cm LA Vol (A2C):   80.8 ml 40.91 ml/m RA Volume:   53.90 ml  27.29 ml/m LA Vol (A4C):   99.6 ml 50.43 ml/m LA Biplane Vol: 94.2 ml 47.70 ml/m  AORTIC VALVE LVOT Vmax:   106.00 cm/s LVOT Vmean:  79.900 cm/s LVOT VTI:    0.236 m MITRAL VALVE                TRICUSPID VALVE MV Area (PHT): 3.77 cm     TR Peak grad:   33.4 mmHg MV Decel Time: 201 msec     TR Vmax:        289.00 cm/s MV E velocity: 91.30 cm/s MV A velocity: 128.00 cm/s  SHUNTS MV E/A ratio:  0.71         Systemic VTI:  0.24 m                             Systemic Diam: 1.90 cm Kirk Ruths MD Electronically signed by Kirk Ruths MD Signature Date/Time: 03/17/2020/1:45:05 PM    Final      LOS: 2 days   Oren Binet, MD  [facility]    To contact the attending provider between 7A-7P or the covering provider during after hours 7P-7A, please log into the web site [email or web address] and access using universal [place] password for that web site. If you do not have the password, please call the hospital operator.  03/19/2020, 10:37 AM

## 2020-03-19 NOTE — Consult Note (Signed)
Referral MD  Reason for Referral: Metastatic renal cell carcinoma; iron deficiency anemia; congestive heart failure with volume overload  Chief Complaint  Patient presents with  . Shortness of Breath  : I just got short of breath.  HPI: [name] is well-known to me.  He is a very nice 79 year old white male.  He was recently diagnosed with metastatic renal cell carcinoma.  He presented with extensive abdominal and retroperitoneal adenopathy.  Initially, we thought this could be a lymphoma.  However, biopsies that came back seem to suggest a renal cell carcinoma.  I suspect this probably the left kidney.  He is markedly anemic.  He has been transfused in the office.  He got his first transfusion about 2 weeks ago.  He was then seen on the 26.  At that time, his hemoglobin was 7.4.  We gave him 2 units of blood.  I think he also got iron.  He did get Lasix in between units.  He was transfused on the 28th.  The next day, he became short of breath.  He subsequently had to be brought to the hospital.  He was admitted to the stepdown unit.  When he came in, his hemoglobin was 10.1.  It is slowly gone down.  Yesterday, hemoglobin was 8.8.  He is clearly iron deficient.  I think he was been checked for a GI bleeding.  This has been unremarkable.  He does have an incredibly high troponin I level.  When he came in, the level was over 1600.  He had an echocardiogram done.  Surprisingly, he does have some congestive heart failure.  His echocardiogram showed left ventricular ejection fraction of 35-40%.  He has been diuresed aggressively.  He has no swelling in his legs right now.  He actually looks pretty good to me compared to what he was in the office last week.  His electrolytes today show BUN of 35 creatinine 1.16.  His calcium is 9.2 with an albumin of 2.4.  He is supposed to have a Port-A-Cath placed this week.  He is off Plavix.  Maybe, while he is in the hospital, he can have the Port-A-Cath  placed.  Thankfully, his pain seems to be doing better.  We increased his Duragesic patch to 25 mcg.  This has helped.  I just hate the fact that he gets anemic.  Again he has to be losing blood.  I do not think this is hemolytic.  We will give him a dose of iron while he is in the hospital.  He was plan to start immunotherapy along with targeted therapy this week.  His appetite seems to be doing okay.  I do not think there is any nausea or vomiting.  Overall, I would say his performance status is ECOG 2.    Past Medical History:  Diagnosis Date  . Alzheimer's dementia ([place])   . Anemia associated with left renal cell cancer treated with erythropoietin ([place]) 03/14/2020  . Cancer ([place])   . Diabetes mellitus   . GERD (gastroesophageal reflux disease)   . Goals of care, counseling/discussion 02/28/2020  . High cholesterol   . Hypercalcemia of malignancy 03/14/2020  . Hypertension   . Iron deficiency anemia due to chronic blood loss 02/29/2020  . Kidney cancer, primary, with metastasis from kidney to other site, left ([place]) 03/14/2020  . Lymphoma ([place])   . 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 Stroke [facility])   :  Past Surgical History:  Procedure Laterality Date  . APPENDECTOMY    . IR US GUIDE BX ASP/DRAIN  03/08/2020  . PACEMAKER INSERTION    . PROSTATE SURGERY    . TONSILLECTOMY    :   Current Facility-Administered Medications:  .  [START ON 03/20/2020] 0.9 %  sodium chloride infusion, , Intravenous, Continuous, Kenneth Lyons, Kenneth K, PA-C, Last Rate: 50 mL/hr at 03/19/20 0540, New Bag at 03/19/20 0540 .  acetaminophen (TYLENOL) tablet 650 mg, 650 mg, Oral, Q6H PRN **OR** acetaminophen (TYLENOL) suppository 650 mg, 650 mg, Rectal, Q6H PRN, Kenneth Lyons, Kenneth Burger, MD .  alfuzosin (UROXATRAL) 24 hr tablet 10 mg, 10 mg, Oral, Daily, Kenneth Lyons, Kenneth Burger, MD, 10 mg at 03/18/20 1013 .   carvedilol (COREG) tablet 25 mg, 25 mg, Oral, BID WC, Kenneth Lyons, Kenneth Burger, MD, 25 mg at 03/19/20 0631 .  [START ON 03/20/2020] ceFAZolin (ANCEF) IVPB 2g/100 mL premix, 2 g, Intravenous, to XRAY, Kenneth Lyons, Kenneth K, PA-C .  ceFEPIme (MAXIPIME) 2 g in sodium chloride 0.9 % 100 mL IVPB, 2 g, Intravenous, Q12H, Kenneth Lyons, Kenneth Burger, MD, Last Rate: 200 mL/hr at 03/19/20 0527, 2 g at 03/19/20 0527 .  escitalopram (LEXAPRO) tablet 10 mg, 10 mg, Oral, Daily, Kenneth Lyons, Kenneth Burger, MD, 10 mg at 03/18/20 1011 .  fentaNYL (DURAGESIC) 25 MCG/HR 1 patch, 1 patch, Transdermal, Q72H, Kenneth Lyons, Kenneth Leber, MD, 1 patch at 03/18/20 1323 .  furosemide (LASIX) 100 mg in dextrose 5 % 50 mL IVPB, 100 mg, Intravenous, BID, Kenneth Lyons, Kenneth Freer, MD .  gabapentin (NEURONTIN) capsule 100 mg, 100 mg, Oral, QHS PRN, Kenneth Lyons, Kenneth Leber, MD .  HYDROcodone-acetaminophen ([place]) 7.5-325 MG per tablet 1 tablet, 1 tablet, Oral, Q6H PRN, Kenneth Lyons, Kenneth Burger, MD .  insulin aspart (novoLOG) injection 0-15 Units, 0-15 Units, Subcutaneous, TID AC & HS, Kenneth Lyons, Kenneth Burger, MD, 3 Units at 03/19/20 0631 .  lisinopril (ZESTRIL) tablet 40 mg, 40 mg, Oral, Daily, Kenneth Lyons, Kenneth Burger, MD, 40 mg at 03/18/20 1011 .  memantine (NAMENDA) tablet 10 mg, 10 mg, Oral, BID, Kenneth Lyons, Kenneth Burger, MD, 10 mg at 03/18/20 2149 .  ondansetron (ZOFRAN) tablet 4 mg, 4 mg, Oral, Q6H PRN **OR** ondansetron (ZOFRAN) injection 4 mg, 4 mg, Intravenous, Q6H PRN, Kenneth Lyons, Kenneth Burger, MD .  pantoprazole (PROTONIX) injection 40 mg, 40 mg, Intravenous, Daily, Kenneth Lyons, Kenneth Burger, MD, 40 mg at 03/18/20 1013 .  polyethylene glycol (MIRALAX / GLYCOLAX) packet 17 g, 17 g, Oral, Daily, Kenneth Lyons, Kenneth M, MD .  potassium chloride SA (KLOR-CON) CR tablet 40 mEq, 40 mEq, Oral, Once, Kenneth Lyons, Kenneth Freer, MD .  rosuvastatin (CRESTOR) tablet 20 mg, 20 mg, Oral, q1800, Kenneth Lyons, Kenneth Burger, MD, 20 mg at 03/18/20 1729 .  spironolactone (ALDACTONE) tablet 25 mg, 25 mg, Oral, Daily, Kenneth Lyons, Kenneth Freer, MD, 25 mg at 03/18/20 1013:  . alfuzosin  10 mg Oral Daily  . carvedilol  25 mg Oral BID WC  . escitalopram  10 mg Oral Daily  . fentaNYL  1 patch Transdermal Q72H  . insulin aspart  0-15 Units Subcutaneous TID AC & HS  . lisinopril  40 mg Oral Daily  . memantine  10 mg Oral BID  . pantoprazole (PROTONIX) IV  40 mg Intravenous Daily  . polyethylene glycol  17 g Oral Daily  . potassium chloride  40 mEq Oral Once  . rosuvastatin  20 mg Oral q1800  . spironolactone  25 mg  Oral Daily  :  No Known Allergies:  Family History  Problem Relation Age of Onset  . Other Neg Hx   :  Social History   Socioeconomic History  . Marital status: Married    Spouse name: Not on file  . Number of children: Not on file  . Years of education: Not on file  . Highest education level: Not on file  Occupational History    Comment: Retired Engineer, maintenance (IT)  Tobacco Use  . Smoking status: Former Research scientist (life sciences)  . Smokeless tobacco: Never Used  Substance and Sexual Activity  . Alcohol use: Yes    Comment: occ  . Drug use: No  . Sexual activity: Not on file  Other Topics Concern  . Not on file  Social History Narrative  . Not on file   Social Determinants of Health   Financial Resource Strain: Low Risk   . Difficulty of Paying Living Expenses: Not hard at all  Food Insecurity: No Food Insecurity  . Worried About Charity fundraiser in the Last Year: Never true  . Ran Out of Food in the Last Year: Never true  Transportation Needs: No Transportation Needs  . Lack of Transportation (Medical): No  . Lack of Transportation (Non-Medical): No  Physical Activity: Inactive  . Days of Exercise per Week: 0 days  . Minutes of Exercise per Session: 0 min  Stress: No Stress Concern Present  . Feeling of Stress : Only a little  Social Connections: Unknown  . Frequency of Communication with Friends and Family: More than three times a week  . Frequency of Social Gatherings with Friends and Family: Not on file  .  Attends Religious Services: Not on file  . Active Member of Clubs or Organizations: Not on file  . Attends Archivist Meetings: Not on file  . Marital Status: Married  Human resources officer Violence: Not At Risk  . Fear of Current or Ex-Partner: No  . Emotionally Abused: No  . Physically Abused: No  . Sexually Abused: No  :  Review of Systems  Constitutional: Positive for malaise/fatigue.  HENT: Negative.   Eyes: Negative.   Respiratory: Positive for shortness of breath.   Cardiovascular: Positive for leg swelling.  Gastrointestinal: Positive for abdominal pain.  Genitourinary: Negative.   Musculoskeletal: Negative.   Skin: Negative.   Neurological: Negative.   Endo/Heme/Allergies: Negative.   Psychiatric/Behavioral: Negative.      Exam:  This is an elderly appearing white male in no obvious distress.  Vital signs show temperature 98.2.  Pulse 73.  Blood pressure 154/75.  Oxygen saturation on room air is 98%.  Head and neck exam shows no scleral icterus.  There is no adenopathy in the neck.  Thyroid is nonpalpable.  Lungs are pretty clear bilaterally.  Cardiac exam regular rate and rhythm with a normal S1 and S2.  There is a 1/6 systolic ejection murmur.  Abdomen is soft.  He has decent bowel sounds.  There is no fluid wave.  There is no palpable liver or spleen tip.  Extremities show this compression stockings on his legs.  There is no or at least very little edema in his legs.  Neurological exam is nonfocal.  Skin exam shows no rashes, ecchymoses or petechia.  Patient Vitals for the past 24 hrs:  BP Temp Temp src Pulse Resp SpO2  03/19/20 0411 (!) 154/75 98.2 F (36.8 C) Oral 73 16 100 %  03/18/20 2318 (!) 158/75 98.4 F (36.9 C) Oral 70  18 98 %  03/18/20 1935 (!) 159/72 98 F (36.7 C) Oral 70 13 97 %  03/18/20 1620 (!) 149/73 (!) 97.3 F (36.3 C) Oral 69 15 94 %  03/18/20 1440 136/63 (!) 97.4 F (36.3 C) Oral 71 14 96 %  03/18/20 0807 -- -- -- 71 20 94 %  03/18/20  0736 (!) 155/66 98 F (36.7 C) Oral 75 18 92 %     Recent Labs    03/17/20 0757 03/18/20 0224  WBC 10.0 7.4  HGB 9.7* 8.8*  HCT 30.9* 29.0*  PLT 540* 471*   Recent Labs    03/17/20 0757 03/18/20 0224  NA 143 143  Lyons 3.4* 3.6  CL 104 100  CO2 26 27  GLUCOSE 234* 111*  BUN 35* 35*  CREATININE 1.13 1.16  CALCIUM 9.5 9.2    Blood smear review: None  Pathology: None    Assessment and Plan: Mr. Iannone is a nice 79 year old white male.  He has metastatic renal cell carcinoma.  Thankfully, the disease is not in his liver or lungs.  He is has what appears to be just adenopathy from the cancer.  As such, I think that he would have a decent response to immunotherapy/TKI inhibitor.  I just was not aware that he had some mild cardiomyopathy.  I am sure that the blood that he got in the office even though he got Lasix, probably cause some volume overload with him.  He does look good.  There is really very little swelling in his legs.  The doctors have done a fantastic job with getting him diuresed.  Again, while he is in the hospital, maybe we can get the Port-A-Cath placed.  I have been nice to see what his hemoglobin is today.  He seemed to have a nice response to the transfusion.  Now, the blood count is going down.  I do not think he is hemolyzing.  Again his iron is on the low side.  I will give him a dose of iron.  I know with renal cell carcinoma, anemia can be an issue.  We could always consider using ESA on him.  I have very much appreciate the outstanding care that he is getting from all the staff on [place].   Kenneth Haw, MD  [place] 20:4

## 2020-03-19 NOTE — Progress Notes (Signed)
Chief Complaint: Patient was seen in consultation today for port palcement  Referring Physician(s): Dr. Marin Olp  Supervising Physician: Daryll Brod  Patient Status: [facility] - In-pt  History of Present Illness: Kenneth Lyons is a 79 y.o. male with metastatic renal cell carcinoma. He was on IR schedule for tomorrow for port placement. Has been anemic with his disease process and had to have a couple units PRBC transfusion end of last week. Developed some SOB and volume overload and has had to be admitted. Was given IV diuresis and is doing much better. IR is asked to proceed with port as scheduled tomorrow. He is normally on Plavix for hx of CVA and has been instructed to hold for the past 5 days, which he says he has. Has hx of dementia but is familiar with plan for port as scheduled tomorrow. No family at bedside. Denies CP, SOB this am. PMHx, meds, labs, imaging, allergies reviewed.   Past Medical History:  Diagnosis Date  . Alzheimer's dementia ([place])   . Anemia associated with left renal cell cancer treated with erythropoietin ([place]) 03/14/2020  . Cancer ([place])   . Diabetes mellitus   . GERD (gastroesophageal reflux disease)   . Goals of care, counseling/discussion 02/28/2020  . High cholesterol   . Hypercalcemia of malignancy 03/14/2020  . Hypertension   . Iron deficiency anemia due to chronic blood loss 02/29/2020  . Kidney cancer, primary, with metastasis from kidney to other site, left ([place]) 03/14/2020  . Lymphoma ([place])   . 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 Malignant neoplasm of kidney metastatic to lymph nodes of multiple regions ([place]) 03/14/2020  . Stroke [facility])     Past Surgical History:  Procedure Laterality Date  . APPENDECTOMY    . IR US GUIDE BX ASP/DRAIN  03/08/2020  . PACEMAKER INSERTION    . PROSTATE SURGERY    . TONSILLECTOMY      Allergies: Patient has no known allergies.  Medications:  Current Facility-Administered Medications:   .  [START ON 03/20/2020] 0.9 %  sodium chloride infusion, , Intravenous, Continuous, Allred, Darrell K, PA-C, Last Rate: 50 mL/hr at 03/19/20 0540, New Bag at 03/19/20 0540 .  acetaminophen (TYLENOL) tablet 650 mg, 650 mg, Oral, Q6H PRN **OR** acetaminophen (TYLENOL) suppository 650 mg, 650 mg, Rectal, Q6H PRN, Shalhoub, Sherryll Burger, MD .  alfuzosin (UROXATRAL) 24 hr tablet 10 mg, 10 mg, Oral, Daily, Shalhoub, Sherryll Burger, MD, 10 mg at 03/18/20 1013 .  carvedilol (COREG) tablet 25 mg, 25 mg, Oral, BID WC, Shalhoub, Sherryll Burger, MD, 25 mg at 03/19/20 0631 .  [START ON 03/20/2020] ceFAZolin (ANCEF) IVPB 2g/100 mL premix, 2 g, Intravenous, to XRAY, Allred, Darrell K, PA-C .  ceFEPIme (MAXIPIME) 2 g in sodium chloride 0.9 % 100 mL IVPB, 2 g, Intravenous, Q12H, Shalhoub, Sherryll Burger, MD, Last Rate: 200 mL/hr at 03/19/20 0527, 2 g at 03/19/20 0527 .  escitalopram (LEXAPRO) tablet 10 mg, 10 mg, Oral, Daily, Shalhoub, Sherryll Burger, MD, 10 mg at 03/18/20 1011 .  fentaNYL (DURAGESIC) 25 MCG/HR 1 patch, 1 patch, Transdermal, Q72H, Ghimire, Henreitta Leber, MD, 1 patch at 03/18/20 1323 .  ferumoxytol (FERAHEME) 510 mg in sodium chloride 0.9 % 100 mL IVPB, 510 mg, Intravenous, Once, Ennever, Rudell Cobb, MD .  furosemide (LASIX) 100 mg in dextrose 5 % 50 mL IVPB, 100 mg, Intravenous, BID, O'Neal, Cassie Freer, MD .  gabapentin (NEURONTIN) capsule 100 mg, 100 mg, Oral, QHS PRN, Ghimire, Henreitta Leber, MD .  HYDROcodone-acetaminophen (NORCO) 7.5-325 MG per tablet 1 tablet, 1 tablet, Oral, Q6H PRN, Shalhoub, Sherryll Burger, MD .  insulin aspart (novoLOG) injection 0-15 Units, 0-15 Units, Subcutaneous, TID AC & HS, Shalhoub, Sherryll Burger, MD, 3 Units at 03/19/20 0631 .  lisinopril (ZESTRIL) tablet 40 mg, 40 mg, Oral, Daily, Shalhoub, Sherryll Burger, MD, 40 mg at 03/18/20 1011 .  memantine (NAMENDA) tablet 10 mg, 10 mg, Oral, BID, Shalhoub, Sherryll Burger, MD, 10 mg at 03/18/20 2149 .  ondansetron (ZOFRAN) tablet 4 mg, 4 mg, Oral, Q6H PRN **OR** ondansetron (ZOFRAN)  injection 4 mg, 4 mg, Intravenous, Q6H PRN, Shalhoub, Sherryll Burger, MD .  pantoprazole (PROTONIX) injection 40 mg, 40 mg, Intravenous, Daily, Shalhoub, Sherryll Burger, MD, 40 mg at 03/18/20 1013 .  polyethylene glycol (MIRALAX / GLYCOLAX) packet 17 g, 17 g, Oral, Daily, Ghimire, Shanker M, MD .  potassium chloride SA (KLOR-CON) CR tablet 40 mEq, 40 mEq, Oral, Once, O'Neal, Cassie Freer, MD .  rosuvastatin (CRESTOR) tablet 20 mg, 20 mg, Oral, q1800, Shalhoub, Sherryll Burger, MD, 20 mg at 03/18/20 1729 .  spironolactone (ALDACTONE) tablet 25 mg, 25 mg, Oral, Daily, O'Neal, Cassie Freer, MD, 25 mg at 03/18/20 1013    Family History  Problem Relation Age of Onset  . Other Neg Hx     Social History   Socioeconomic History  . Marital status: Married    Spouse name: Not on file  . Number of children: Not on file  . Years of education: Not on file  . Highest education level: Not on file  Occupational History    Comment: Retired Engineer, maintenance (IT)  Tobacco Use  . Smoking status: Former Research scientist (life sciences)  . Smokeless tobacco: Never Used  Substance and Sexual Activity  . Alcohol use: Yes    Comment: occ  . Drug use: No  . Sexual activity: Not on file  Other Topics Concern  . Not on file  Social History Narrative  . Not on file   Social Determinants of Health   Financial Resource Strain: Low Risk   . Difficulty of Paying Living Expenses: Not hard at all  Food Insecurity: No Food Insecurity  . Worried About Charity fundraiser in the Last Year: Never true  . Ran Out of Food in the Last Year: Never true  Transportation Needs: No Transportation Needs  . Lack of Transportation (Medical): No  . Lack of Transportation (Non-Medical): No  Physical Activity: Inactive  . Days of Exercise per Week: 0 days  . Minutes of Exercise per Session: 0 min  Stress: No Stress Concern Present  . Feeling of Stress : Only a little  Social Connections: Unknown  . Frequency of Communication with Friends and Family: More than three times a  week  . Frequency of Social Gatherings with Friends and Family: Not on file  . Attends Religious Services: Not on file  . Active Member of Clubs or Organizations: Not on file  . Attends Archivist Meetings: Not on file  . Marital Status: Married    Review of Systems: A 12 point ROS discussed and pertinent positives are indicated in the HPI above.  All other systems are negative.  Review of Systems  Vital Signs: BP (!) 163/74 (BP Location: Left Arm)   Pulse 71   Temp 98 F (36.7 C) (Oral)   Resp 19   Ht 5\' 7"  (1.702 m)   Wt 85.8 kg   SpO2 94%   BMI 29.63 kg/m   Physical Exam Constitutional:  General: He is not in acute distress.    Appearance: Normal appearance. He is not ill-appearing.  HENT:     Mouth/Throat:     Mouth: Mucous membranes are moist.  Cardiovascular:     Rate and Rhythm: Normal rate and regular rhythm.     Heart sounds: Normal heart sounds.  Pulmonary:     Effort: Pulmonary effort is normal. No respiratory distress.     Breath sounds: Normal breath sounds.  Skin:    General: Skin is warm and dry.  Neurological:     General: No focal deficit present.     Mental Status: He is oriented to person, place, and time.  Psychiatric:        Mood and Affect: Mood normal.        Thought Content: Thought content normal.        Judgment: Judgment normal.       Imaging: NM PET Image Initial (PI) Skull Base To Thigh  Result Date: 03/06/2020 CLINICAL DATA:  Initial treatment strategy for retroperitoneal and pelvic lymphadenopathy on abdominopelvic CT. Remote history of prostate cancer. EXAM: NUCLEAR MEDICINE PET SKULL BASE TO THIGH TECHNIQUE: 9.74 mCi F-18 FDG was injected intravenously. Full-ring PET imaging was performed from the skull base to thigh after the radiotracer. CT data was obtained and used for attenuation correction and anatomic localization. Fasting blood glucose: 78 mg/dl COMPARISON:  Abdominopelvic CT 02/26/2020 and 07/06/2012.  FINDINGS: Mediastinal blood pool activity: SUV max 1.1 Liver activity: SUV max 3.2 NECK: There is an ill-defined conglomerate of hypermetabolic left supraclavicular lymph nodes extending into the left superior mediastinum. These measure up to 4.7 x 4.5 cm on image 63/3. SUV max 6.2. Apart from these nodes, no other hypermetabolic cervical lymph nodes.There are no lesions of the pharyngeal mucosal space. Incidental CT findings: none CHEST: Apart from the hypermetabolic left supraclavicular nodes which extend into the superior mediastinum, there are no other hypermetabolic thoracic lymph nodes. No hypermetabolic pulmonary activity or suspicious pulmonary nodularity. Incidental CT findings: Trace bilateral pleural effusions. The lungs appear clear. The heart is enlarged. Left subclavian pacemaker leads extend into the right atrium and right ventricle. There is atherosclerosis of the aorta, great vessels and coronary arteries. Calcifications of the aortic valve are noted. ABDOMEN/PELVIS: There is no hypermetabolic activity within the liver, adrenal glands, spleen or pancreas. The spleen is normal in size without focal hypermetabolic activity (SUV max 2.4). A right retrocrural node measuring 2.0 cm short axis on image 536/1 is hypermetabolic with an SUV max of 4.8. The extensive confluent retroperitoneal lymphadenopathy seen on recent CT also demonstrates mild-to-moderate hypermetabolic activity. A left periaortic component measuring 6.7 x 6.3 cm on image 167/3 has an SUV max of 6.9. A left common iliac component with a short axis dimension of 3.5 cm on image 202/3 has an SUV max of 5.2. No inguinal adenopathy. Prior prostatectomy without abnormal activity in the prostatectomy bed. Incidental CT findings: Mild hepatic steatosis. Nonobstructing left renal calculi are demonstrated. No hydronephrosis or ureteral calculus. Diffuse aortic and branch vessel atherosclerosis. SKELETON: There is no hypermetabolic activity to  suggest osseous metastatic disease. Incidental CT findings: Moderate facet hypertrophy in the lower lumbar spine. No blastic osseous lesions. IMPRESSION: 1. Moderately hypermetabolic left supraclavicular, superior mediastinal, retroperitoneal and pelvic lymphadenopathy, most consistent with lymphoma. Recent serum PSA level less than 0.1 ng/ml making metastatic prostate cancer unlikely in this patient with a remote history of prostate cancer. 2. No solid visceral organ or osseous involvement. Electronically Signed  By: Richardean Sale M.D.   On: 03/06/2020 14:40   IR US Guide Bx Asp/Drain  Result Date: 03/08/2020 INDICATION: 79 year old male with multifocal lymphadenopathy both above and below the diaphragm. He presents for ultrasound-guided core biopsy of hypermetabolic left supraclavicular nodes. EXAM: Ultrasound-guided core biopsy left supraclavicular lymph nodes MEDICATIONS: None. ANESTHESIA/SEDATION: 0.5 mg Versed administered for anxiolysis. This does not constitute conscious sedation. FLUOROSCOPY TIME:  None COMPLICATIONS: None immediate. PROCEDURE: Informed written consent was obtained from the patient after a thorough discussion of the procedural risks, benefits and alternatives. All questions were addressed. A timeout was performed prior to the initiation of the procedure. Ultrasound was used to interrogate the left supraclavicular fossa. Numerous enlarged and rounded hypoechoic lymph nodes were identified. A suitable skin entry site was selected and marked. The region was sterilely prepped and draped in standard fashion using chlorhexidine skin prep. Local anesthesia was attained by infiltration with 1% lidocaine. A small dermatotomy was made. Under real-time ultrasound guidance, multiple 18 gauge core biopsies were obtained using the [employer] mission automated biopsy device. Biopsy specimens were placed in saline and delivered to pathology for further analysis. Post biopsy ultrasound imaging demonstrates  no evidence of complication. IMPRESSION: Ultrasound-guided core biopsy of left supraclavicular lymph nodes. Of note, many of the biopsy specimens were fragmented which can suggest tissue necrosis/debris. Copious biopsy specimens were obtained in an effort to ensure successful tissue diagnosis. Electronically Signed   By: Jacqulynn Cadet M.D.   On: 03/08/2020 13:18   DG Chest Port 1 View  Result Date: 03/17/2020 CLINICAL DATA:  Fevers EXAM: PORTABLE CHEST 1 VIEW COMPARISON:  03/16/2020 FINDINGS: Cardiac shadow is enlarged but stable. Pacing device is again seen. The patchy airspace opacity seen in the bases bilaterally have improved significantly although residual is noted primarily within the left lung base. No other focal abnormality is noted. No bony abnormality is seen. IMPRESSION: Significant improved aeration when compared with the prior exam. Residual left basilar opacity is noted. Electronically Signed   By: Inez Catalina M.D.   On: 03/17/2020 09:16   DG Chest Port 1 View  Result Date: 03/16/2020 CLINICAL DATA:  79 year old male with shortness of breath. EXAM: PORTABLE CHEST 1 VIEW COMPARISON:  Chest radiograph dated 02/10/2020. FINDINGS: Bilateral lower lung field airspace densities concerning for developing infiltrate. Clinical correlation is recommended. Trace left pleural effusion may be present. No pneumothorax. Stable cardiomediastinal silhouette. Left pectoral pacemaker device. No acute osseous pathology. IMPRESSION: Bilateral lower lung field airspace densities concerning for developing infiltrate. Clinical correlation and follow-up recommended. Electronically Signed   By: Anner Crete M.D.   On: 03/16/2020 23:11   ECHOCARDIOGRAM COMPLETE  Result Date: 03/17/2020    ECHOCARDIOGRAM REPORT   Patient Name:   KEI MCELHINEY Date of Exam: 03/17/2020 Medical Rec #:  [id number]       Height:       67.0 in Accession #:    [id number]      Weight:       189.2 lb Date of Birth:  1941/06/04       BSA:          1.975 m Patient Age:    40 years        BP:           131/70 mmHg Patient Gender: M               HR:           78 bpm. Exam Location:  Inpatient Procedure: 2D  Echo, Color Doppler, Cardiac Doppler and Intracardiac            Opacification Agent Indications:    NSTEMI  History:        Patient has no prior history of Echocardiogram examinations.                 CHF, NSTEMI, Pacemaker; Risk Factors:Hypertension, Diabetes and                 Dyslipidemia.  Sonographer:    Raquel Sarna Senior RDCS Referring Phys: [id number] [place]  1. Definity used; anteroseptal and apical akinesis with overall moderate LV dysfunction; grade 1 diastolic dysfunction; mild LVH; mild AS (mean gradient 11 mmHg); mild MR; moderate LAE.  2. Left ventricular ejection fraction, by estimation, is 35 to 40%. The left ventricle has moderately decreased function. The left ventricle demonstrates regional wall motion abnormalities (see scoring diagram/findings for description). There is mild left ventricular hypertrophy. Left ventricular diastolic parameters are consistent with Grade I diastolic dysfunction (impaired relaxation). Elevated left atrial pressure.  3. Right ventricular systolic function is normal. The right ventricular size is normal. There is moderately elevated pulmonary artery systolic pressure.  4. Left atrial size was moderately dilated.  5. The mitral valve is normal in structure. Mild mitral valve regurgitation. No evidence of mitral stenosis.  6. The aortic valve is tricuspid. Aortic valve regurgitation is not visualized. Mild aortic valve stenosis.  7. The inferior vena cava is dilated in size with <50% respiratory variability, suggesting right atrial pressure of 15 mmHg. FINDINGS  Left Ventricle: Left ventricular ejection fraction, by estimation, is 35 to 40%. The left ventricle has moderately decreased function. The left ventricle demonstrates regional wall motion abnormalities. Definity contrast  agent was given IV to delineate the left ventricular endocardial borders. The left ventricular internal cavity size was normal in size. There is mild left ventricular hypertrophy. Left ventricular diastolic parameters are consistent with Grade I diastolic dysfunction (impaired relaxation). Elevated left atrial pressure. Right Ventricle: The right ventricular size is normal. Right ventricular systolic function is normal. There is moderately elevated pulmonary artery systolic pressure. The tricuspid regurgitant velocity is 2.89 m/s, and with an assumed right atrial pressure of 15 mmHg, the estimated right ventricular systolic pressure is 66.4 mmHg. Left Atrium: Left atrial size was moderately dilated. Right Atrium: Right atrial size was normal in size. Pericardium: There is no evidence of pericardial effusion. Mitral Valve: The mitral valve is normal in structure. Normal mobility of the mitral valve leaflets. Mild mitral annular calcification. Mild mitral valve regurgitation. No evidence of mitral valve stenosis. Tricuspid Valve: The tricuspid valve is normal in structure. Tricuspid valve regurgitation is mild . No evidence of tricuspid stenosis. Aortic Valve: The aortic valve is tricuspid. Aortic valve regurgitation is not visualized. Mild aortic stenosis is present. Pulmonic Valve: The pulmonic valve was not well visualized. Pulmonic valve regurgitation is not visualized. No evidence of pulmonic stenosis. Aorta: The aortic root is normal in size and structure. Venous: The inferior vena cava is dilated in size with less than 50% respiratory variability, suggesting right atrial pressure of 15 mmHg. IAS/Shunts: No atrial level shunt detected by color flow Doppler. Additional Comments: Definity used; anteroseptal and apical akinesis with overall moderate LV dysfunction; grade 1 diastolic dysfunction; mild LVH; mild AS (mean gradient 11 mmHg); mild MR; moderate LAE. A pacer wire is visualized.  LEFT VENTRICLE PLAX 2D  LVIDd:         3.90 cm  Diastology LVIDs:  2.60 cm  LV e' lateral:   5.00 cm/s LV PW:         1.20 cm  LV E/e' lateral: 18.3 LV IVS:        1.20 cm  LV e' medial:    4.24 cm/s LVOT diam:     1.90 cm  LV E/e' medial:  21.5 LV SV:         67 LV SV Index:   34 LVOT Area:     2.84 cm  RIGHT VENTRICLE RV S prime:     9.25 cm/s TAPSE (M-mode): 1.9 cm LEFT ATRIUM             Index       RIGHT ATRIUM           Index LA diam:        4.30 cm 2.18 cm/m  RA Area:     19.10 cm LA Vol (A2C):   80.8 ml 40.91 ml/m RA Volume:   53.90 ml  27.29 ml/m LA Vol (A4C):   99.6 ml 50.43 ml/m LA Biplane Vol: 94.2 ml 47.70 ml/m  AORTIC VALVE LVOT Vmax:   106.00 cm/s LVOT Vmean:  79.900 cm/s LVOT VTI:    0.236 m MITRAL VALVE                TRICUSPID VALVE MV Area (PHT): 3.77 cm     TR Peak grad:   33.4 mmHg MV Decel Time: 201 msec     TR Vmax:        289.00 cm/s MV E velocity: 91.30 cm/s MV A velocity: 128.00 cm/s  SHUNTS MV E/A ratio:  0.71         Systemic VTI:  0.24 m                             Systemic Diam: 1.90 cm Kirk Ruths MD Electronically signed by Kirk Ruths MD Signature Date/Time: 03/17/2020/1:45:05 PM    Final    CT Renal Stone Study  Result Date: 02/26/2020 CLINICAL DATA:  Low back pain.  LEFT flank pain for 3 weeks. EXAM: CT ABDOMEN AND PELVIS WITHOUT CONTRAST TECHNIQUE: Multidetector CT imaging of the abdomen and pelvis was performed following the standard protocol without IV contrast. COMPARISON:  CT 03/03/2012 FINDINGS: Lower chest: Lung bases are clear. Hepatobiliary: No focal hepatic lesion. No biliary duct dilatation. Gallbladder is normal. Common bile duct is normal. Pancreas: Pancreas is normal. No ductal dilatation. No pancreatic inflammation. Spleen: Normal spleen Adrenals/urinary tract: Adrenal glands normal. Two nonobstructing calculi in the LEFT kidney measuring 4 mm each. Punctate calcification in the RIGHT kidney measuring 1 mm. No ureterolithiasis or obstructive uropathy. Bladder normal.  Stomach/Bowel: Stomach, small-bowel and cecum are normal. The appendix is not identified but there is no pericecal inflammation to suggest appendicitis. The colon and rectosigmoid colon are normal. Vascular/Lymphatic: Calcification of the abdominal aorta. Normal caliber. There is bulky retroperitoneal periaortic lymphadenopathy which extends to the common iliac vessels. Lymph nodes surround the aorta and IVC. Lymph node mass on the LEFT at the level of the kidney measures 5.7 x 5.6 cm. LEFT common iliac lymph node measures 2.6 cm (image 43/2). LEFT external iliac lymph node measures 2.8 cm. No inguinal adenopathy. Reproductive: Post prostatectomy Other: No free fluid. Musculoskeletal: No aggressive osseous lesion. IMPRESSION: 1. Bulky retroperitoneal periaortic lymphadenopathy extending to the iliac nodal chains most consistent with LYMPHOMA. 2. Most inferior lymph node is a LEFT  external iliac lymph node. 3. Normal volume spleen. 4. Bilateral nephrolithiasis. 5.  Aortic Atherosclerosis (ICD10-I70.0). Findings conveyed toPEDRO CARDAMA on 02/26/2020  at06:22. Electronically Signed   By: Suzy Bouchard M.D.   On: 02/26/2020 06:22    Labs:  CBC: Recent Labs    03/16/20 2253 03/16/20 2253 03/17/20 0137 03/17/20 0757 03/18/20 0224 03/19/20 0744  WBC 13.3*  --   --  10.0 7.4 8.4  HGB 10.1*   < > 11.6* 9.7* 8.8* 9.4*  HCT 32.7*   < > 34.0* 30.9* 29.0* 30.5*  PLT 654*  --   --  540* 471* 567*   < > = values in this interval not displayed.    COAGS: Recent Labs    03/08/20 0710 03/17/20 0757  INR 1.1 1.4*  APTT  --  74*    BMP: Recent Labs    03/16/20 2253 03/16/20 2253 03/17/20 0137 03/17/20 0757 03/18/20 0224 03/19/20 0744  NA 141   < > 140 143 143 140  K 5.0   < > 3.6 3.4* 3.6 3.2*  CL 103  --   --  104 100 102  CO2 26  --   --  26 27 26   GLUCOSE 302*  --   --  234* 111* 216*  BUN 36*  --   --  35* 35* 25*  CALCIUM 9.7  --   --  9.5 9.2 8.8*  CREATININE 1.25*  --   --  1.13  1.16 1.00  GFRNONAA 55*  --   --  >60 >60 >60  GFRAA >60  --   --  >60 >60 >60   < > = values in this interval not displayed.    LIVER FUNCTION TESTS: Recent Labs    02/28/20 0950 03/14/20 1517 03/17/20 0757 03/18/20 0224  BILITOT 0.2* 0.4 0.4 0.4  AST 14* 17 24 28   ALT 11 11 18 18   ALKPHOS 40 49 50 45  PROT 6.4* 7.1 6.5 6.4*  ALBUMIN 3.6 2.7* 2.5* 2.4*    TUMOR MARKERS: No results for input(s): AFPTM, CEA, CA199, CHROMGRNA in the last 8760 hours.  Assessment and Plan: Metastatic renal cell carcinoma For port placement Chart reviewed and pt appears recovered from acute reason for admission. Plavix has been held. D/w pt and wife that IR should be able to proceed with port as scheduled tomorrow. Labs reviewed. Please hold any anticoagulation today for procedure tomorrow. NPO p MN Risks and benefits of image guided port-a-catheter placement was discussed with the patient including, but not limited to bleeding, infection, pneumothorax, or fibrin sheath development and need for additional procedures.  All of the patient's questions were answered, patient is agreeable to proceed. Consent signed and in chart.    Thank you for this interesting consult.  I greatly enjoyed meeting Domenick Quebedeaux and look forward to participating in their care.  A copy of this report was sent to the requesting provider on this date.  Electronically Signed: Ascencion Dike, PA-C 03/19/2020, 9:05 AM   I spent a total of 25 minutes in face to face in clinical consultation, greater than 50% of which was counseling/coordinating care for port placement

## 2020-03-19 NOTE — Progress Notes (Signed)
Cardiology Progress Note  Patient ID: Kenneth Lyons MRN: [id number] DOB: [birth year] Date of Encounter: 03/19/2020  Primary Cardiologist: No primary care provider on file.  Subjective   Chief Complaint: SOB  HPI: Has had good response to lasix. Still volume up. BMP pending. Reports SOB this morning.   ROS:  All other ROS reviewed and negative. Pertinent positives noted in the HPI.     Inpatient Medications  Scheduled Meds: . alfuzosin  10 mg Oral Daily  . carvedilol  25 mg Oral BID WC  . escitalopram  10 mg Oral Daily  . fentaNYL  1 patch Transdermal Q72H  . insulin aspart  0-15 Units Subcutaneous TID AC & HS  . lisinopril  40 mg Oral Daily  . memantine  10 mg Oral BID  . pantoprazole (PROTONIX) IV  40 mg Intravenous Daily  . polyethylene glycol  17 g Oral Daily  . potassium chloride  40 mEq Oral Once  . rosuvastatin  20 mg Oral q1800  . spironolactone  25 mg Oral Daily   Continuous Infusions: . [START ON 03/20/2020] sodium chloride 50 mL/hr at 03/19/20 0540  . [START ON 03/20/2020]  ceFAZolin (ANCEF) IV    . ceFEPime (MAXIPIME) IV 2 g (03/19/20 0527)  . ferumoxytol    . furosemide     PRN Meds: acetaminophen **OR** acetaminophen, gabapentin, HYDROcodone-acetaminophen, ondansetron **OR** ondansetron (ZOFRAN) IV   Vital Signs   Vitals:   03/18/20 1935 03/18/20 2318 03/19/20 0411 03/19/20 0725  BP: (!) 159/72 (!) 158/75 (!) 154/75 (!) 163/74  Pulse: 70 70 73 71  Resp: 13 18 16 19   Temp: 98 F (36.7 C) 98.4 F (36.9 C) 98.2 F (36.8 C) 98 F (36.7 C)  TempSrc: Oral Oral Oral Oral  SpO2: 97% 98% 100% 94%  Weight:      Height:        Intake/Output Summary (Last 24 hours) at 03/19/2020 0820 Last data filed at 03/19/2020 0300 Gross per 24 hour  Intake 563.52 ml  Output 1350 ml  Net -786.48 ml   Last 3 Weights 03/17/2020 03/17/2020 03/14/2020  Weight (lbs) 189 lb 2.5 oz 185 lb 190 lb  Weight (kg) 85.8 kg 83.915 kg 86.183 kg      Telemetry  Overnight telemetry  shows V paced rhythm, which I personally reviewed.   ECG  The most recent ECG shows V paced rhythm, which I personally reviewed.   Physical Exam   Vitals:   03/18/20 1935 03/18/20 2318 03/19/20 0411 03/19/20 0725  BP: (!) 159/72 (!) 158/75 (!) 154/75 (!) 163/74  Pulse: 70 70 73 71  Resp: 13 18 16 19   Temp: 98 F (36.7 C) 98.4 F (36.9 C) 98.2 F (36.8 C) 98 F (36.7 C)  TempSrc: Oral Oral Oral Oral  SpO2: 97% 98% 100% 94%  Weight:      Height:         Intake/Output Summary (Last 24 hours) at 03/19/2020 0820 Last data filed at 03/19/2020 9233 Gross per 24 hour  Intake 563.52 ml  Output 1350 ml  Net -786.48 ml    Last 3 Weights 03/17/2020 03/17/2020 03/14/2020  Weight (lbs) 189 lb 2.5 oz 185 lb 190 lb  Weight (kg) 85.8 kg 83.915 kg 86.183 kg    Body mass index is 29.63 kg/m.   General: Well nourished, well developed, in no acute distress Head: Atraumatic, normal size  Eyes: PEERLA, EOMI  Neck: Supple, JVD 8 cm H20 Endocrine: No thryomegaly Cardiac: Normal S1, S2; RRR;  no murmurs, rubs, or gallops Lungs: +rales  Abd: Soft, nontender, no hepatomegaly  Ext: 1+ edema  Musculoskeletal: No deformities, BUE and BLE strength normal and equal Skin: Warm and dry, no rashes   Neuro: Alert and oriented to person, place, time, and situation, CNII-XII grossly intact, no focal deficits  Psych: Normal mood and affect   Labs  High Sensitivity Troponin:   Recent Labs  Lab 03/17/20 0217 03/17/20 0417 03/17/20 1416 03/17/20 1811  TROPONINIHS 1,194* 1,559* 1,657* 1,493*     Cardiac EnzymesNo results for input(s): TROPONINI in the last 168 hours. No results for input(s): TROPIPOC in the last 168 hours.  Chemistry Recent Labs  Lab 03/14/20 1517 03/14/20 1517 03/16/20 2253 03/16/20 2253 03/17/20 0137 03/17/20 0757 03/18/20 0224  NA 141   < > 141   < > 140 143 143  K 4.5   < > 5.0   < > 3.6 3.4* 3.6  CL 103   < > 103  --   --  104 100  CO2 26   < > 26  --   --  26 27   GLUCOSE 138*   < > 302*  --   --  234* 111*  BUN 30*   < > 36*  --   --  35* 35*  CREATININE 1.18  --  1.25*  --   --  1.13 1.16  CALCIUM 9.5   < > 9.7  --   --  9.5 9.2  PROT 7.1  --   --   --   --  6.5 6.4*  ALBUMIN 2.7*  --   --   --   --  2.5* 2.4*  AST 17  --   --   --   --  24 28  ALT 11  --   --   --   --  18 18  ALKPHOS 49  --   --   --   --  50 45  BILITOT 0.4  --   --   --   --  0.4 0.4  GFRNONAA 59*  --  55*  --   --  >60 >60  GFRAA >60  --  >60  --   --  >60 >60  ANIONGAP 12   < > 12  --   --  13 16*   < > = values in this interval not displayed.    Hematology Recent Labs  Lab 03/17/20 0757 03/18/20 0224 03/19/20 0744  WBC 10.0 7.4 8.4  RBC 3.89* 3.62* 3.82*  HGB 9.7* 8.8* 9.4*  HCT 30.9* 29.0* 30.5*  MCV 79.4* 80.1 79.8*  MCH 24.9* 24.3* 24.6*  MCHC 31.4 30.3 30.8  RDW 17.7* 18.2* 18.1*  PLT 540* 471* 567*   BNP Recent Labs  Lab 03/16/20 2250  BNP 512.5*    DDimer No results for input(s): DDIMER in the last 168 hours.   Radiology  DG Chest Port 1 View  Result Date: 03/17/2020 CLINICAL DATA:  Fevers EXAM: PORTABLE CHEST 1 VIEW COMPARISON:  03/16/2020 FINDINGS: Cardiac shadow is enlarged but stable. Pacing device is again seen. The patchy airspace opacity seen in the bases bilaterally have improved significantly although residual is noted primarily within the left lung base. No other focal abnormality is noted. No bony abnormality is seen. IMPRESSION: Significant improved aeration when compared with the prior exam. Residual left basilar opacity is noted. Electronically Signed   By: Inez Catalina M.D.   On: 03/17/2020 09:16  ECHOCARDIOGRAM COMPLETE  Result Date: 03/17/2020    ECHOCARDIOGRAM REPORT   Patient Name:   Kenneth Lyons Date of Exam: 03/17/2020 Medical Rec #:  [id number]       Height:       67.0 in Accession #:    [id number]      Weight:       189.2 lb Date of Birth:  Sep 04, 1941      BSA:          1.975 m Patient Age:    79 years        BP:            131/70 mmHg Patient Gender: M               HR:           78 bpm. Exam Location:  Inpatient Procedure: 2D Echo, Color Doppler, Cardiac Doppler and Intracardiac            Opacification Agent Indications:    NSTEMI  History:        Patient has no prior history of Echocardiogram examinations.                 CHF, NSTEMI, Pacemaker; Risk Factors:Hypertension, Diabetes and                 Dyslipidemia.  Sonographer:    Raquel Sarna Senior RDCS Referring Phys: [id number] [place]  1. Definity used; anteroseptal and apical akinesis with overall moderate LV dysfunction; grade 1 diastolic dysfunction; mild LVH; mild AS (mean gradient 11 mmHg); mild MR; moderate LAE.  2. Left ventricular ejection fraction, by estimation, is 35 to 40%. The left ventricle has moderately decreased function. The left ventricle demonstrates regional wall motion abnormalities (see scoring diagram/findings for description). There is mild left ventricular hypertrophy. Left ventricular diastolic parameters are consistent with Grade I diastolic dysfunction (impaired relaxation). Elevated left atrial pressure.  3. Right ventricular systolic function is normal. The right ventricular size is normal. There is moderately elevated pulmonary artery systolic pressure.  4. Left atrial size was moderately dilated.  5. The mitral valve is normal in structure. Mild mitral valve regurgitation. No evidence of mitral stenosis.  6. The aortic valve is tricuspid. Aortic valve regurgitation is not visualized. Mild aortic valve stenosis.  7. The inferior vena cava is dilated in size with <50% respiratory variability, suggesting right atrial pressure of 15 mmHg. FINDINGS  Left Ventricle: Left ventricular ejection fraction, by estimation, is 35 to 40%. The left ventricle has moderately decreased function. The left ventricle demonstrates regional wall motion abnormalities. Definity contrast agent was given IV to delineate the left ventricular endocardial  borders. The left ventricular internal cavity size was normal in size. There is mild left ventricular hypertrophy. Left ventricular diastolic parameters are consistent with Grade I diastolic dysfunction (impaired relaxation). Elevated left atrial pressure. Right Ventricle: The right ventricular size is normal. Right ventricular systolic function is normal. There is moderately elevated pulmonary artery systolic pressure. The tricuspid regurgitant velocity is 2.89 m/s, and with an assumed right atrial pressure of 15 mmHg, the estimated right ventricular systolic pressure is 28.7 mmHg. Left Atrium: Left atrial size was moderately dilated. Right Atrium: Right atrial size was normal in size. Pericardium: There is no evidence of pericardial effusion. Mitral Valve: The mitral valve is normal in structure. Normal mobility of the mitral valve leaflets. Mild mitral annular calcification. Mild mitral valve regurgitation. No evidence of mitral valve stenosis. Tricuspid Valve: The tricuspid valve  is normal in structure. Tricuspid valve regurgitation is mild . No evidence of tricuspid stenosis. Aortic Valve: The aortic valve is tricuspid. Aortic valve regurgitation is not visualized. Mild aortic stenosis is present. Pulmonic Valve: The pulmonic valve was not well visualized. Pulmonic valve regurgitation is not visualized. No evidence of pulmonic stenosis. Aorta: The aortic root is normal in size and structure. Venous: The inferior vena cava is dilated in size with less than 50% respiratory variability, suggesting right atrial pressure of 15 mmHg. IAS/Shunts: No atrial level shunt detected by color flow Doppler. Additional Comments: Definity used; anteroseptal and apical akinesis with overall moderate LV dysfunction; grade 1 diastolic dysfunction; mild LVH; mild AS (mean gradient 11 mmHg); mild MR; moderate LAE. A pacer wire is visualized.  LEFT VENTRICLE PLAX 2D LVIDd:         3.90 cm  Diastology LVIDs:         2.60 cm  LV e'  lateral:   5.00 cm/s LV PW:         1.20 cm  LV E/e' lateral: 18.3 LV IVS:        1.20 cm  LV e' medial:    4.24 cm/s LVOT diam:     1.90 cm  LV E/e' medial:  21.5 LV SV:         67 LV SV Index:   34 LVOT Area:     2.84 cm  RIGHT VENTRICLE RV S prime:     9.25 cm/s TAPSE (M-mode): 1.9 cm LEFT ATRIUM             Index       RIGHT ATRIUM           Index LA diam:        4.30 cm 2.18 cm/m  RA Area:     19.10 cm LA Vol (A2C):   80.8 ml 40.91 ml/m RA Volume:   53.90 ml  27.29 ml/m LA Vol (A4C):   99.6 ml 50.43 ml/m LA Biplane Vol: 94.2 ml 47.70 ml/m  AORTIC VALVE LVOT Vmax:   106.00 cm/s LVOT Vmean:  79.900 cm/s LVOT VTI:    0.236 m MITRAL VALVE                TRICUSPID VALVE MV Area (PHT): 3.77 cm     TR Peak grad:   33.4 mmHg MV Decel Time: 201 msec     TR Vmax:        289.00 cm/s MV E velocity: 91.30 cm/s MV A velocity: 128.00 cm/s  SHUNTS MV E/A ratio:  0.71         Systemic VTI:  0.24 m                             Systemic Diam: 1.90 cm Kirk Ruths MD Electronically signed by Kirk Ruths MD Signature Date/Time: 03/17/2020/1:45:05 PM    Final     Cardiac Studies  TTE 03/17/2020 1. Definity used; anteroseptal and apical akinesis with overall moderate  LV dysfunction; grade 1 diastolic dysfunction; mild LVH; mild AS (mean  gradient 11 mmHg); mild MR; moderate LAE.  2. Left ventricular ejection fraction, by estimation, is 35 to 40%. The  left ventricle has moderately decreased function. The left ventricle  demonstrates regional wall motion abnormalities (see scoring  diagram/findings for description). There is mild  left ventricular hypertrophy. Left ventricular diastolic parameters are  consistent with Grade I diastolic dysfunction (impaired relaxation).  Elevated left atrial pressure.  3. Right ventricular systolic function is normal. The right ventricular  size is normal. There is moderately elevated pulmonary artery systolic  pressure.  4. Left atrial size was moderately dilated.   5. The mitral valve is normal in structure. Mild mitral valve  regurgitation. No evidence of mitral stenosis.  6. The aortic valve is tricuspid. Aortic valve regurgitation is not  visualized. Mild aortic valve stenosis.  7. The inferior vena cava is dilated in size with <50% respiratory  variability, suggesting right atrial pressure of 15 mmHg.   Patient Profile  Willson Lipa is a 79 y.o. male with CHB s/p ppm 2013, metastatic RCC, HTN, DM, mild AS, dementia (4 years) admitted 03/17/2020 for volume overload. Found to have NSTEMI and EF drop.   Assessment & Plan   1. Acute Systolic HF, EF 99-83% with WMA/NSTEMI/Anemia -admitted with volume overload. Received pRBCs prior to chemo. Possibly transfusion associated circulatory overload. EF 35-40% with anteroseptal/apical akinesis. Troponin peak 1657 and trending down. EKG with V paced rhythm. No CP. Hgb 6.2 02/28/2020 and was up to 11.6 with transfusion. Trending down.  -could have been related to transfusion vs NSTEMI. Not candidate for ASA/plavix/heparin due to anemia. No LHC due to anemia. Discussed extensively with family who are in agreement. Bleeding risk and anemia preclude standard treatment for NSTEMI.  -still a bit volume up; continue lasix 80 mg BID today. Anticipate he will be able to transition to oral diuretic tomorrow.  -continue coreg, lisinopril, aldactone -he has a cardiologist in [facility] who he will follow with at discharge. I did offer our assistance if needed in outpatient setting. My card was given to his wife.   For questions or updates, please contact [place] Please consult [email or web address] for contact info under   Time Spent with Patient: I have spent a total of 25 minutes with patient reviewing hospital notes, telemetry, EKGs, labs and examining the patient as well as establishing an assessment and plan that was discussed with the patient.  > 50% of time was spent in direct patient care.    Signed, Addison Naegeli.  Audie Box, [place]  03/19/2020 8:20 AM

## 2020-03-20 ENCOUNTER — Ambulatory Visit (HOSPITAL_COMMUNITY): Admission: RE | Admit: 2020-03-20 | Payer: HMO | Source: Ambulatory Visit

## 2020-03-20 ENCOUNTER — Encounter: Payer: Self-pay | Admitting: *Deleted

## 2020-03-20 ENCOUNTER — Telehealth: Payer: Self-pay | Admitting: Medical

## 2020-03-20 ENCOUNTER — Other Ambulatory Visit: Payer: Self-pay | Admitting: *Deleted

## 2020-03-20 ENCOUNTER — Inpatient Hospital Stay (HOSPITAL_COMMUNITY): Payer: HMO

## 2020-03-20 ENCOUNTER — Other Ambulatory Visit: Payer: Self-pay

## 2020-03-20 DIAGNOSIS — I5023 Acute on chronic systolic (congestive) heart failure: Secondary | ICD-10-CM

## 2020-03-20 DIAGNOSIS — C642 Malignant neoplasm of left kidney, except renal pelvis: Secondary | ICD-10-CM

## 2020-03-20 DIAGNOSIS — C649 Malignant neoplasm of unspecified kidney, except renal pelvis: Secondary | ICD-10-CM

## 2020-03-20 HISTORY — PX: IR IMAGING GUIDED PORT INSERTION: IMG5740

## 2020-03-20 LAB — GLUCOSE, CAPILLARY
Glucose-Capillary: 147 mg/dL — ABNORMAL HIGH (ref 70–99)
Glucose-Capillary: 231 mg/dL — ABNORMAL HIGH (ref 70–99)

## 2020-03-20 LAB — CBC WITH DIFFERENTIAL/PLATELET
Abs Immature Granulocytes: 0.06 10*3/uL (ref 0.00–0.07)
Abs Immature Granulocytes: 0.05 10*3/uL (ref 0.00–0.07)
Basophils Absolute: 0 10*3/uL (ref 0.0–0.1)
Basophils Absolute: 0 10*3/uL (ref 0.0–0.1)
Basophils Relative: 1 %
Basophils Relative: 0 %
Eosinophils Absolute: 0.3 10*3/uL (ref 0.0–0.5)
Eosinophils Absolute: 0.3 10*3/uL (ref 0.0–0.5)
Eosinophils Relative: 3 %
Eosinophils Relative: 4 %
HCT: 30.6 % — ABNORMAL LOW (ref 39.0–52.0)
HCT: 33 % — ABNORMAL LOW (ref 39.0–52.0)
Hemoglobin: 9.4 g/dL — ABNORMAL LOW (ref 13.0–17.0)
Hemoglobin: 10 g/dL — ABNORMAL LOW (ref 13.0–17.0)
Immature Granulocytes: 1 %
Immature Granulocytes: 1 %
Lymphocytes Relative: 7 %
Lymphocytes Relative: 6 %
Lymphs Abs: 0.7 10*3/uL (ref 0.7–4.0)
Lymphs Abs: 0.5 10*3/uL — ABNORMAL LOW (ref 0.7–4.0)
MCH: 24.9 pg — ABNORMAL LOW (ref 26.0–34.0)
MCH: 24.5 pg — ABNORMAL LOW (ref 26.0–34.0)
MCHC: 30.7 g/dL (ref 30.0–36.0)
MCHC: 30.3 g/dL (ref 30.0–36.0)
MCV: 81 fL (ref 80.0–100.0)
MCV: 80.9 fL (ref 80.0–100.0)
Monocytes Absolute: 0.7 10*3/uL (ref 0.1–1.0)
Monocytes Absolute: 0.7 10*3/uL (ref 0.1–1.0)
Monocytes Relative: 8 %
Monocytes Relative: 8 %
Neutro Abs: 7 10*3/uL (ref 1.7–7.7)
Neutro Abs: 7.3 10*3/uL (ref 1.7–7.7)
Neutrophils Relative %: 80 %
Neutrophils Relative %: 81 %
Platelets: 538 10*3/uL — ABNORMAL HIGH (ref 150–400)
Platelets: 601 10*3/uL — ABNORMAL HIGH (ref 150–400)
RBC: 3.78 MIL/uL — ABNORMAL LOW (ref 4.22–5.81)
RBC: 4.08 MIL/uL — ABNORMAL LOW (ref 4.22–5.81)
RDW: 18.2 % — ABNORMAL HIGH (ref 11.5–15.5)
RDW: 18.3 % — ABNORMAL HIGH (ref 11.5–15.5)
WBC: 8.7 10*3/uL (ref 4.0–10.5)
WBC: 8.9 10*3/uL (ref 4.0–10.5)
nRBC: 0 % (ref 0.0–0.2)
nRBC: 0 % (ref 0.0–0.2)

## 2020-03-20 LAB — BASIC METABOLIC PANEL
Anion gap: 13 (ref 5–15)
BUN: 24 mg/dL — ABNORMAL HIGH (ref 8–23)
CO2: 25 mmol/L (ref 22–32)
Calcium: 9.2 mg/dL (ref 8.9–10.3)
Chloride: 102 mmol/L (ref 98–111)
Creatinine, Ser: 1.03 mg/dL (ref 0.61–1.24)
GFR calc Af Amer: >60 mL/min (ref >60)
GFR calc non Af Amer: >60 mL/min (ref >60)
Glucose, Bld: 151 mg/dL — ABNORMAL HIGH (ref 70–99)
Potassium: 3.8 mmol/L (ref 3.5–5.1)
Sodium: 140 mmol/L (ref 135–145)

## 2020-03-20 LAB — PROTIME-INR
INR: 1.3 — ABNORMAL HIGH (ref 0.8–1.2)
Prothrombin Time: 15.2 seconds (ref 11.4–15.2)

## 2020-03-20 LAB — LACTATE DEHYDROGENASE: LDH: 259 U/L — ABNORMAL HIGH (ref 98–192)

## 2020-03-20 LAB — OCCULT BLOOD X 1 CARD TO LAB, STOOL: Fecal Occult Bld: NEGATIVE

## 2020-03-20 MED ORDER — POTASSIUM CHLORIDE CRYS ER 20 MEQ PO TBCR
20.0000 meq | EXTENDED_RELEASE_TABLET | Freq: Every day | ORAL | Status: DC
  Administered 2020-03-20: 20 meq via ORAL
  Filled 2020-03-20: qty 1

## 2020-03-20 MED ORDER — FENTANYL CITRATE (PF) 100 MCG/2ML IJ SOLN
INTRAMUSCULAR | Status: AC | PRN
  Administered 2020-03-20: 50 ug via INTRAVENOUS

## 2020-03-20 MED ORDER — HEPARIN SOD (PORK) LOCK FLUSH 100 UNIT/ML IV SOLN
INTRAVENOUS | Status: AC | PRN
  Administered 2020-03-20: 500 [IU] via INTRAVENOUS

## 2020-03-20 MED ORDER — LOSARTAN POTASSIUM 50 MG PO TABS
100.0000 mg | ORAL_TABLET | Freq: Every day | ORAL | Status: DC
  Administered 2020-03-20: 100 mg via ORAL
  Filled 2020-03-20: qty 2

## 2020-03-20 MED ORDER — MIDAZOLAM HCL 2 MG/2ML IJ SOLN
INTRAMUSCULAR | Status: AC
  Filled 2020-03-20: qty 2

## 2020-03-20 MED ORDER — PROCHLORPERAZINE MALEATE 10 MG PO TABS: 10.0000 mg | ORAL_TABLET | Freq: Four times a day (QID) | ORAL | 1 refills | Status: DC | PRN

## 2020-03-20 MED ORDER — FENTANYL CITRATE (PF) 100 MCG/2ML IJ SOLN
INTRAMUSCULAR | Status: AC
  Filled 2020-03-20: qty 2

## 2020-03-20 MED ORDER — LIDOCAINE-EPINEPHRINE (PF) 1 %-1:200000 IJ SOLN
INTRAMUSCULAR | Status: AC | PRN
  Administered 2020-03-20: 20 mL

## 2020-03-20 MED ORDER — POTASSIUM CHLORIDE CRYS ER 20 MEQ PO TBCR: 20.0000 meq | EXTENDED_RELEASE_TABLET | Freq: Every day | ORAL | 0 refills | Status: DC

## 2020-03-20 MED ORDER — SPIRONOLACTONE 25 MG PO TABS: 25.0000 mg | ORAL_TABLET | Freq: Every day | ORAL | 0 refills | Status: DC

## 2020-03-20 MED ORDER — LOSARTAN POTASSIUM 100 MG PO TABS: 100.0000 mg | ORAL_TABLET | Freq: Every day | ORAL | 0 refills | Status: DC

## 2020-03-20 MED ORDER — FUROSEMIDE 40 MG PO TABS: 40.0000 mg | ORAL_TABLET | Freq: Every day | ORAL | 0 refills | Status: DC

## 2020-03-20 MED ORDER — HEPARIN SOD (PORK) LOCK FLUSH 100 UNIT/ML IV SOLN
INTRAVENOUS | Status: AC
  Filled 2020-03-20: qty 5

## 2020-03-20 MED ORDER — LIDOCAINE-EPINEPHRINE 1 %-1:100000 IJ SOLN
INTRAMUSCULAR | Status: AC
  Filled 2020-03-20: qty 1

## 2020-03-20 MED ORDER — FUROSEMIDE 40 MG PO TABS
40.0000 mg | ORAL_TABLET | Freq: Every day | ORAL | Status: DC
  Administered 2020-03-20: 40 mg via ORAL
  Filled 2020-03-20: qty 1

## 2020-03-20 MED ORDER — MIDAZOLAM HCL 2 MG/2ML IJ SOLN
INTRAMUSCULAR | Status: AC | PRN
  Administered 2020-03-20: 1 mg via INTRAVENOUS

## 2020-03-20 NOTE — Progress Notes (Addendum)
Progress Note  Patient Name: Kenneth Lyons Date of Encounter: 03/20/2020  Primary Cardiologist: Dr. Minna Merritts [facility])  Subjective   Feeling good following his port-a-cath placement this morning. No complaints of chest pain, SOB, or palpitations. He is eager to go home. They wish to follow with [facility] going forward for cardiology care.   Inpatient Medications    Scheduled Meds: . alfuzosin  10 mg Oral Daily  . carvedilol  25 mg Oral BID WC  . escitalopram  10 mg Oral Daily  . fentaNYL  1 patch Transdermal Q72H  . fentaNYL      . insulin aspart  0-15 Units Subcutaneous TID AC & HS  . lidocaine-EPINEPHrine      . lisinopril  40 mg Oral Daily  . memantine  10 mg Oral BID  . midazolam      . pantoprazole (PROTONIX) IV  40 mg Intravenous Daily  . polyethylene glycol  17 g Oral Daily  . rosuvastatin  20 mg Oral q1800  . spironolactone  25 mg Oral Daily   Continuous Infusions: . sodium chloride 50 mL/hr at 03/20/20 0752  .  ceFAZolin (ANCEF) IV    . ceFEPime (MAXIPIME) IV 2 g (03/20/20 0537)   PRN Meds: acetaminophen **OR** acetaminophen, fentaNYL, gabapentin, HYDROcodone-acetaminophen, lidocaine-EPINEPHrine, midazolam, ondansetron **OR** ondansetron (ZOFRAN) IV   Vital Signs    Vitals:   03/20/20 0855 03/20/20 0900 03/20/20 0905 03/20/20 0910  BP: (!) 154/73 (!) 160/77 (!) 159/77 (!) 156/82  Pulse: 70 72 71 70  Resp: 16 15 16 14   Temp:      TempSrc:      SpO2: 98% 98% 97% 98%  Weight:      Height:        Intake/Output Summary (Last 24 hours) at 03/20/2020 0912 Last data filed at 03/19/2020 2319 Gross per 24 hour  Intake 720 ml  Output 2000 ml  Net -1280 ml   Filed Weights   03/17/20 0113 03/17/20 0600  Weight: 83.9 kg 85.8 kg    Telemetry    V-paced - rate int he 70s - Personally Reviewed  ECG    No new tracings - Personally Reviewed  Physical Exam   GEN: No acute distress.   Neck: No JVD, no carotid bruits Cardiac: RRR, no murmurs, rubs,  or gallops.  Respiratory: Clear to auscultation bilaterally, no wheezes/ rales/ rhonchi GI: NABS, Soft, nontender, non-distended  MS: No edema; No deformity. Neuro:  Nonfocal, moving all extremities spontaneously Psych: Normal affect   Labs    Chemistry Recent Labs  Lab 03/14/20 1517 03/16/20 2253 03/17/20 0757 03/17/20 0757 03/18/20 0224 03/19/20 0744 03/20/20 0805  NA 141   < > 143   < > 143 140 140  K 4.5   < > 3.4*   < > 3.6 3.2* 3.8  CL 103   < > 104   < > 100 102 102  CO2 26   < > 26   < > 27 26 25   GLUCOSE 138*   < > 234*   < > 111* 216* 151*  BUN 30*   < > 35*   < > 35* 25* 24*  CREATININE 1.18   < > 1.13   < > 1.16 1.00 1.03  CALCIUM 9.5   < > 9.5   < > 9.2 8.8* 9.2  PROT 7.1  --  6.5  --  6.4*  --   --   ALBUMIN 2.7*  --  2.5*  --  2.4*  --   --   AST 17  --  24  --  28  --   --   ALT 11  --  18  --  18  --   --   ALKPHOS 49  --  50  --  45  --   --   BILITOT 0.4  --  0.4  --  0.4  --   --   GFRNONAA 59*   < > >60   < > >60 >60 >60  GFRAA >60   < > >60   < > >60 >60 >60  ANIONGAP 12   < > 13   < > 16* 12 13   < > = values in this interval not displayed.     Hematology Recent Labs  Lab 03/19/20 0744 03/20/20 0259 03/20/20 0805  WBC 8.4 8.7 8.9  RBC 3.82*  3.75* 3.78* 4.08*  HGB 9.4* 9.4* 10.0*  HCT 30.5* 30.6* 33.0*  MCV 79.8* 81.0 80.9  MCH 24.6* 24.9* 24.5*  MCHC 30.8 30.7 30.3  RDW 18.1* 18.2* 18.3*  PLT 567* 538* 601*    Cardiac EnzymesNo results for input(s): TROPONINI in the last 168 hours. No results for input(s): TROPIPOC in the last 168 hours.   BNP Recent Labs  Lab 03/16/20 2250  BNP 512.5*     DDimer No results for input(s): DDIMER in the last 168 hours.   Radiology    No results found.  Cardiac Studies   TTE 03/17/2020 1. Definity used; anteroseptal and apical akinesis with overall moderate  LV dysfunction; grade 1 diastolic dysfunction; mild LVH; mild AS (mean  gradient 11 mmHg); mild MR; moderate LAE.  2. Left  ventricular ejection fraction, by estimation, is 35 to 40%. The  left ventricle has moderately decreased function. The left ventricle  demonstrates regional wall motion abnormalities (see scoring  diagram/findings for description). There is mild  left ventricular hypertrophy. Left ventricular diastolic parameters are  consistent with Grade I diastolic dysfunction (impaired relaxation).  Elevated left atrial pressure.  3. Right ventricular systolic function is normal. The right ventricular  size is normal. There is moderately elevated pulmonary artery systolic  pressure.  4. Left atrial size was moderately dilated.  5. The mitral valve is normal in structure. Mild mitral valve  regurgitation. No evidence of mitral stenosis.  6. The aortic valve is tricuspid. Aortic valve regurgitation is not  visualized. Mild aortic valve stenosis.  7. The inferior vena cava is dilated in size with <50% respiratory  variability, suggesting right atrial pressure of 15 mmHg.   Patient Profile     Kenneth Lyons a 79 y.o.malewith CHB s/p ppm 2013, metastatic RCC, HTN, DM, mild AS, dementia (4 years) admitted 03/17/2020 for volume overload. Found to have NSTEMIand EF drop.   Assessment & Plan    1. Acute combined CHF: patient presented with acute respiratory failure following 2 uPRBC at the outpatient infusion clinic. Initially required BiPAP. Echo showed EF 35-40%, G1DD, mild LVH, moderate LAE, mild MR, and mild AS. Started on IV lasix 80mg  BID with UOP net -1.2L in the past 24 hours and -3.4L this admission. No weight trend yet. Unclear etiology, though differential includes ischemic cardiomyopathy or HTN mediated cardiomyopathy. - Continue carvedilol and spironolactone - Will transition lisinopril to losartan for ease of transition to entresto - Will transition to po lasix 40mg  daily - Will need a BMET in 1 week for close monitoring of his electrolytes and kidney function  2. Elevated troponin:  trop peaked at 1650s. Likely demand ischemia in the setting of #1, though cannot exclude ischemic etiology. He has had anemia requiring PRBC's which limits ischemic evaluation as he would not tolerate DAPT if intervention was warranted. Additionally anemia prevents conservative medical management of his elevated troponins as he would not tolerate 48 hours of heparin.  - Continue management per #1 - Could consider an outpatient ischemic evaluation once anemia stabilizes.  - Favor continuing to hold aspirin and plavix   3. HTN: BP still intermittently elevated.  - Managed in the context of #1  4. CHB s/p PPM: device functioning properly - Will place a referral to EP at discharge for ongoing monitoring as he would like to transition care to [place]   5. Metastatic RCC: port-a-cath placed this morning by IR. Anticipate starting immunotherapy per Dr. Marin Olp.  - Continue management per oncology and primary team.   6. Anemia: Hgb stable at 10.0 today, from 9.4 yesterday.  - Continue management per primary team/oncology  For questions or updates, please contact [place] Please consult [email or web address] for contact info under Cardiology/STEMI.      Signed, Abigail Butts, PA-C  03/20/2020, 9:12 AM   [phone]  I have personally seen and examined this patient. I agree with the assessment and plan as outlined above.  Will change Lisinopril to Losartan. Change to Lasix 40 mg daily. Plans for outpatient ischemic evaluation. OK to d/c home today. We will arrange f/u with Dr. Marisue Ivan.   Lauree Chandler 03/20/2020 12:11 PM

## 2020-03-20 NOTE — Care Management (Signed)
Per Kittie Plater W/ [employer]  Help Desk @[phone]  Jardiance 10 mg. Daily $134.07 for 30 day supply.  Farxiga 10mg  and Entresto 24-26mg  Non-formularyPA required@844 -[id number].  NO PA required for Jardiance Deductible (pt. In coverage gap)[email or web address]

## 2020-03-20 NOTE — Discharge Summary (Addendum)
PATIENT DETAILS Name: Kenneth Lyons Age: 79 y.o. Sex: male Date of Birth: 1941-03-10 MRN: [id number]. Admitting Physician: [name], MD RDE:[id number], Barbarann Ehlers, DO  Admit Date: 03/16/2020 Discharge date: 03/20/2020  Recommendations for Outpatient Follow-up:  1. Follow up with PCP in 1-2 weeks 2. Please obtain CMP/CBC in one week 3. Please ensure follow-up with cardiology and oncology 4. If hemoglobin continues to be stable over the next several weeks-consider resuming antiplatelet agents. 5. Consider palliative care evaluation at some point in the outpatient setting.  Admitted From:  Home  Disposition: Home with home health services   [place]: Yes  Equipment/Devices: None  Discharge Condition: Stable  CODE STATUS:  DNR  Diet recommendation:  Diet Order            Diet Carb Modified Fluid consistency: Thin; Room service appropriate? Yes  Diet effective now        Diet - low sodium heart healthy        Diet Carb Modified               Brief Narrative: Patient is a 79 y.o. male with recent diagnosis of metastatic renal cell carcinoma, anemia related to underlying cancer-who was given 2 units of PRBC at the outpatient infusion center-following which patient developed acute respiratory distress-was brought to the emergency room-found to be hypoxemic requiring BiPAP support.  Given intravenous Lasix-clinically rapidly improved-and was titrated to nasal cannula.    Further work-up revealed non-STEMI, decompensated systolic heart failure, community-acquired pneumonia.  See below for further details.  Significant events: 5/28>> admit to [facility] for severe hypoxemia requiring BiPAP after 2 units of PRBC transfusion.  Significant studies: 5/29: TTE>> EF 18-56%, grade 1 diastolic dysfunction, anteroseptal/apical akinesis  Antimicrobial therapy: Vancomycin: 5/28>> 5/30 Cefepime : 5/28>>6/1  Microbiology data: 5/28: Blood culture>> no  growth  Procedures : 6/1>> Port-A-Cath placement by IR.  Consults: Cards Oncology IR  Brief Hospital Course: Acute hypoxic respiratory failure: Multifactorial etiology secondary to decompensated systolic heart failure, PNA.    Significantly improved with IV diuretics and IV antibiotics-now on room air.  Newly diagnosed decompensated systolic heart failure (EF 35-40% by TTE on 5/29):  Treated with IV diuretics-significantly better-euvolemic on exam.  Will transition to oral diuretics-40 mg of Lasix daily and 25 mg of Aldactone daily-continue Coreg-will be transition to losartan at the recommendation of cardiology.  Cardiology will reassess in the outpatient setting-and start patient on Entresto if BP/volume status stable.  Audiology will arrange for outpatient follow-up.  Non-STEMI: Initially thought to have demand ischemia but upon further evaluation with echo demonstrating regional wall motion abnormalities/reduced EF-now thought to have non-STEMI.  Initially medically managed but due to lack of chest pain/dropping hemoglobin-not on heparin/antiplatelet agents.  Agree with cardiology-given metastatic renal cell carcinoma-dementia-anemia-best managed medically-without pursuing LHC.  Given severity of anemia-requiring blood transfusion-we will avoid antiplatelet agents until his hemoglobin has been consistently stable for the next few weeks.  Community-acquired PNA: Treated with empiric antibiotics-afebrile-respiratory failure has resolved-has completed 5 days of treatment-do not think patient requires any further antimicrobial therapy on discharge given rapid improvement.  Metastatic renal cell carcinoma:  Followed by Dr. Vernona Rieger outpatient follow-up.  Anemia: Appears to be microcytic-Per outpatient oncology note-related to underlying malignancy-perhaps chronic blood loss but no history of overt melena/hematochezia.  Per patient's family-patient had both endoscopy and colonoscopy  sometime late last year without any obvious major abnormalities.  Given 1 dose of IV iron-hemoglobin currently stable.  Patient to continue outpatient follow-up with oncology-given  severity of anemia requiring PRBC transfusion-holding all antiplatelet agents.  History of CVA: [name]-history of Alzheimer's-but appears nonfocal.  Antiplatelet agents stopped due to worsening anemia.  Reassess at next visit-if hemoglobin is stable-could potentially consider resuming antiplatelets.  History of complete heart block: PPM in place  HTN: BP controlled-continue amlodipine, alfuzosin, Coreg, and losartan  DM 2: Follow CBGs-continue SSI  HLD: Continue statin  History of Alzheimer's dementia: [name]-suspect not far from usual baseline.  Remains on Namenda  Chronic pain syndrome: Has cancer related chronic pain-resume transdermal fentanyl, Neurontin.  Follow.  Goals of care: DNR in place-demented patient with stage IV renal cell carcinoma-now with newly diagnosed decompensated heart failure/non-STEMI-oncology still feels that patient could have a decent response to immunotherapy/TKI inhibitor.  Will need continued engagement by oncology/palliative care services in the outpatient setting in case he does not respond well to treatment.    RN pressure injury documentation: Pressure Injury 03/17/20 Sacrum Medial Stage 1 -  Intact skin with non-blanchable redness of a localized area usually over a bony prominence. (Active)  03/17/20 0600  Location: Sacrum  Location Orientation: Medial  Staging: Stage 1 -  Intact skin with non-blanchable redness of a localized area usually over a bony prominence.  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 ([place])   Malignant neoplasm of kidney metastatic to lymph nodes of multiple regions (HCC)   Acute on chronic diastolic CHF (congestive heart failure) (HCC)   NSTEMI (non-ST elevated myocardial infarction) ([place])   Sepsis with acute hypoxic respiratory failure without septic shock ([place])   Mixed hyperlipidemia due to type 2 diabetes mellitus ([place])   Pressure injury of skin   Discharge Instructions:  Activity:  As tolerated with Full fall precautions use walker/cane & assistance as needed  Discharge Instructions    (HEART FAILURE PATIENTS) Call MD:  Anytime you have any of the following symptoms: 1) 3 pound weight gain in 24 hours or 5 pounds in 1 week 2) shortness of breath, with or without a dry hacking cough 3) swelling in the hands, feet or stomach 4) if you have to sleep on extra pillows at night in order to breathe.   Complete by: As directed    Ambulatory referral to Cardiac Electrophysiology   Complete by: As directed    CHB s/p St. Jude PPM - previously followed in [facility] for:  difficulty breathing, headache or visual disturbances   Complete by: As directed    Diet - low sodium heart healthy   Complete by: As directed    Diet Carb Modified   Complete by: As directed    Discharge instructions   Complete by: As directed    Follow with Primary MD  Doug Sou B, DO in 1-2 weeks  Follow with cardiology as instructed.  Follow-up with Dr. Marin Olp within 1 week.  Given severity of anemia requiring blood transfusion-after discussion with cardiology-we have decided to hold your antiplatelet agents (aspirin/Plavix) for a few weeks to ensure that your hemoglobin/blood levels continue to be stable.  Please follow-up with your primary care practitioner in the next few weeks-if hemoglobin is stable-we could consider resuming your antiplatelet agents.  Please get a complete  blood count and  chemistry panel checked by your Primary MD at your next visit, and again as instructed by your Primary MD.  Get Medicines reviewed and adjusted: Please take all your medications with you for your next visit with your Primary MD  Laboratory/radiological data: Please request your Primary MD to go over all hospital tests and procedure/radiological results at the follow up, please ask your Primary MD to get all Hospital records sent to his/her office.  In some cases, they will be blood work, cultures and biopsy results pending at the time of your discharge. Please request that your primary care M.D. follows up on these results.  Also Note the following: If you experience worsening of your admission symptoms, develop shortness of breath, life threatening emergency, suicidal or homicidal thoughts you must seek medical attention immediately by calling 911 or calling your MD immediately  if symptoms less severe.  You must read complete instructions/literature along with all the possible adverse reactions/side effects for all the Medicines you take and that have been prescribed to you. Take any new Medicines after you have completely understood and accpet all the possible adverse reactions/side effects.   Do not drive when taking Pain medications or sleeping medications (Benzodaizepines)  Do not take more than prescribed Pain, Sleep and Anxiety Medications. It is not advisable to combine anxiety,sleep and pain medications without talking with your primary care practitioner  Special Instructions: If you have smoked or chewed Tobacco  in the last 2 yrs please stop smoking, stop any regular Alcohol  and or any Recreational drug use.  Wear Seat belts while driving.  Please note: You were cared for by a hospitalist during your hospital stay. Once you are discharged, your primary care physician will handle any further medical issues. Please note that NO REFILLS for any discharge medications will be  authorized once you are discharged, as it is imperative that you return to your primary care physician (or establish a relationship with a primary care physician if you do not have one) for your post hospital discharge needs so that they can reassess your need for medications and monitor your lab values.   Increase activity slowly   Complete by: As directed      Allergies as of 03/20/2020   No Known Allergies     Medication List    STOP taking these medications   amLODipine 10 MG tablet Commonly known as: NORVASC   aspirin EC 81 MG tablet   clopidogrel 75 MG tablet Commonly known as: PLAVIX   hydrochlorothiazide 25 MG tablet Commonly known as: HYDRODIURIL   lisinopril 40 MG tablet Commonly known as: ZESTRIL     TAKE these medications   alfuzosin 10 MG 24 hr tablet Commonly known as: UROXATRAL Take 10 mg by mouth daily with breakfast.   Cabometyx 40 MG tablet Generic drug: cabozantinib Take 1 tablet (40 mg total) by mouth daily. Take on an empty stomach, 1 hour before or 2 hours after meals. Start taking on: March 21, 2020   CALCIUM 1200+D3 PO Take 1 tablet by mouth daily.   carvedilol 25 MG tablet Commonly known as: COREG Take 25 mg by mouth 2 (two) times daily with a meal.   cetirizine 10 MG tablet Commonly known as: ZYRTEC Take 10 mg by mouth daily.   Cyanocobalamin 5000 MCG Subl Place 5,000 mcg under the tongue daily. Place one dose under the tongue daily   escitalopram 10 MG tablet Commonly known as: LEXAPRO Take 10 mg by  mouth daily.   fentaNYL 25 MCG/HR Commonly known as: [place] 1 patch onto the skin every 3 (three) days.   FISH OIL PO Take 2 capsules by mouth daily.   furosemide 40 MG tablet Commonly known as: LASIX Take 1 tablet (40 mg total) by mouth daily. What changed:   medication strength  how much to take   gabapentin 100 MG capsule Commonly known as: NEURONTIN Take 100 mg by mouth at bedtime as needed (pain).   glipiZIDE 5  MG tablet Commonly known as: GLUCOTROL Take 5 mg by mouth daily before breakfast. Take with glipizide 10 mg to equal 15 mg   glipiZIDE 10 MG tablet Commonly known as: GLUCOTROL Take 10 mg by mouth daily before breakfast. Take with glipizide 5 mg to equal 15 mg   Glucosamine-Chondroitin 250-200 MG Tabs Take 1 tablet by mouth 2 (two) times daily.   HYDROcodone-acetaminophen 7.5-325 MG tablet Commonly known as: NORCO Take 1 tablet by mouth every 6 (six) hours as needed for moderate pain.   losartan 100 MG tablet Commonly known as: COZAAR Take 1 tablet (100 mg total) by mouth daily. Start taking on: March 21, 2020   memantine 10 MG tablet Commonly known as: NAMENDA Take 10 mg by mouth 2 (two) times daily.   metFORMIN 1000 MG tablet Commonly known as: GLUCOPHAGE Take 1,000 mg by mouth 2 (two) times daily with a meal.   multivitamin tablet Take 1 tablet by mouth daily.   potassium chloride SA 20 MEQ tablet Commonly known as: KLOR-CON Take 1 tablet (20 mEq total) by mouth daily.   rosuvastatin 20 MG tablet Commonly known as: CRESTOR Take 20 mg by mouth daily.   selenium 50 MCG Tabs tablet Take 50 mcg by mouth daily.   spironolactone 25 MG tablet Commonly known as: ALDACTONE Take 1 tablet (25 mg total) by mouth daily. Start taking on: March 21, 1609   Trulicity 9.60 AV/4.0JW Sopn Generic drug: Dulaglutide Inject 0.75 mg into the skin every Wednesday.   Vitamin D-1000 Max St 25 MCG (1000 UT) tablet Generic drug: Cholecalciferol Take 1 capsule by mouth daily.            Durable Medical Equipment  (From admission, onward)         Start     Ordered   03/20/20 1112  For home use only DME Hospital bed  Once    Question Answer Comment  Length of Need Lifetime   Patient has (list medical condition): CHF   The above medical condition requires: Patient requires the ability to reposition frequently   Head must be elevated greater than: 30 degrees   Bed type  Semi-electric   Trapeze Bar Yes   Support Surface: Gel Overlay      03/20/20 1111         Follow-up Information    Abigail Butts., PA-C Follow up on 03/26/2020.   Specialty: Physician Assistant Why: Please arrive 15 minutes early for your 3:15pm post-hospital cardiology follow-up appointment.  Contact information: [address] [place] [address] [phone]        Doug Sou B, DO. Schedule an appointment as soon as possible for a visit in 1 week(s).   Specialty: Internal Medicine Contact information: [address] [place]  [address] [phone]        Volanda Napoleon, MD. Schedule an appointment as soon as possible for a visit in 1 week(s).   Specialty: Oncology Contact information: [phone]  [address] [place] [address] [phone]          No Known Allergies    Other Procedures/Studies: NM PET Image Initial (PI) Skull Base To Thigh  Result Date: 03/06/2020 CLINICAL DATA:  Initial treatment strategy for retroperitoneal and pelvic lymphadenopathy on abdominopelvic CT. Remote history of prostate cancer. EXAM: NUCLEAR MEDICINE PET SKULL BASE TO THIGH TECHNIQUE: 9.74 mCi F-18 FDG was injected intravenously. Full-ring PET imaging was performed from the skull base to thigh after the radiotracer. CT data was obtained and used for attenuation correction and anatomic localization. Fasting blood glucose: 78 mg/dl COMPARISON:  Abdominopelvic CT 02/26/2020 and 07/06/2012. FINDINGS: Mediastinal blood pool activity: SUV max 1.1 Liver activity: SUV max 3.2 NECK: There is an ill-defined conglomerate of hypermetabolic left supraclavicular lymph nodes extending into the left superior mediastinum. These measure up to 4.7 x 4.5 cm on image 63/3. SUV max 6.2. Apart from these nodes, no other hypermetabolic cervical lymph nodes.There are no lesions of the pharyngeal mucosal space. Incidental CT findings: none CHEST: Apart  from the hypermetabolic left supraclavicular nodes which extend into the superior mediastinum, there are no other hypermetabolic thoracic lymph nodes. No hypermetabolic pulmonary activity or suspicious pulmonary nodularity. Incidental CT findings: Trace bilateral pleural effusions. The lungs appear clear. The heart is enlarged. Left subclavian pacemaker leads extend into the right atrium and right ventricle. There is atherosclerosis of the aorta, great vessels and coronary arteries. Calcifications of the aortic valve are noted. ABDOMEN/PELVIS: There is no hypermetabolic activity within the liver, adrenal glands, spleen or pancreas. The spleen is normal in size without focal hypermetabolic activity (SUV max 2.4). A right retrocrural node measuring 2.0 cm short axis on image 621/3 is hypermetabolic with an SUV max of 4.8. The extensive confluent retroperitoneal lymphadenopathy seen on recent CT also demonstrates mild-to-moderate hypermetabolic activity. A left periaortic component measuring 6.7 x 6.3 cm on image 167/3 has an SUV max of 6.9. A left common iliac component with a short axis dimension of 3.5 cm on image 202/3 has an SUV max of 5.2. No inguinal adenopathy. Prior prostatectomy without abnormal activity in the prostatectomy bed. Incidental CT findings: Mild hepatic steatosis. Nonobstructing left renal calculi are demonstrated. No hydronephrosis or ureteral calculus. Diffuse aortic and branch vessel atherosclerosis. SKELETON: There is no hypermetabolic activity to suggest osseous metastatic disease. Incidental CT findings: Moderate facet hypertrophy in the lower lumbar spine. No blastic osseous lesions. IMPRESSION: 1. Moderately hypermetabolic left supraclavicular, superior mediastinal, retroperitoneal and pelvic lymphadenopathy, most consistent with lymphoma. Recent serum PSA level less than 0.1 ng/ml making metastatic prostate cancer unlikely in this patient with a remote history of prostate cancer. 2. No  solid visceral organ or osseous involvement. Electronically Signed   By: Richardean Sale M.D.   On: 03/06/2020 14:40   IR US Guide Bx Asp/Drain  Result Date: 03/08/2020 INDICATION: 79 year old male with multifocal lymphadenopathy both above and below the diaphragm. He presents for ultrasound-guided core biopsy of hypermetabolic left supraclavicular nodes. EXAM: Ultrasound-guided core biopsy left supraclavicular lymph nodes MEDICATIONS: None. ANESTHESIA/SEDATION: 0.5 mg Versed administered for anxiolysis. This does not constitute conscious sedation. FLUOROSCOPY TIME:  None COMPLICATIONS: None immediate. PROCEDURE: Informed written consent was obtained from the patient after a thorough discussion of the procedural risks, benefits and alternatives. All questions were addressed. A timeout was performed prior to the initiation of the procedure. Ultrasound was used to interrogate the left supraclavicular fossa. Numerous enlarged and rounded hypoechoic lymph nodes were identified. A suitable skin  entry site was selected and marked. The region was sterilely prepped and draped in standard fashion using chlorhexidine skin prep. Local anesthesia was attained by infiltration with 1% lidocaine. A small dermatotomy was made. Under real-time ultrasound guidance, multiple 18 gauge core biopsies were obtained using the [employer] mission automated biopsy device. Biopsy specimens were placed in saline and delivered to pathology for further analysis. Post biopsy ultrasound imaging demonstrates no evidence of complication. IMPRESSION: Ultrasound-guided core biopsy of left supraclavicular lymph nodes. Of note, many of the biopsy specimens were fragmented which can suggest tissue necrosis/debris. Copious biopsy specimens were obtained in an effort to ensure successful tissue diagnosis. Electronically Signed   By: Jacqulynn Cadet M.D.   On: 03/08/2020 13:18   DG Chest Port 1 View  Result Date: 03/17/2020 CLINICAL DATA:  Fevers EXAM:  PORTABLE CHEST 1 VIEW COMPARISON:  03/16/2020 FINDINGS: Cardiac shadow is enlarged but stable. Pacing device is again seen. The patchy airspace opacity seen in the bases bilaterally have improved significantly although residual is noted primarily within the left lung base. No other focal abnormality is noted. No bony abnormality is seen. IMPRESSION: Significant improved aeration when compared with the prior exam. Residual left basilar opacity is noted. Electronically Signed   By: Inez Catalina M.D.   On: 03/17/2020 09:16   DG Chest Port 1 View  Result Date: 03/16/2020 CLINICAL DATA:  79 year old male with shortness of breath. EXAM: PORTABLE CHEST 1 VIEW COMPARISON:  Chest radiograph dated 02/10/2020. FINDINGS: Bilateral lower lung field airspace densities concerning for developing infiltrate. Clinical correlation is recommended. Trace left pleural effusion may be present. No pneumothorax. Stable cardiomediastinal silhouette. Left pectoral pacemaker device. No acute osseous pathology. IMPRESSION: Bilateral lower lung field airspace densities concerning for developing infiltrate. Clinical correlation and follow-up recommended. Electronically Signed   By: Anner Crete M.D.   On: 03/16/2020 23:11   ECHOCARDIOGRAM COMPLETE  Result Date: 03/17/2020    ECHOCARDIOGRAM REPORT   Patient Name:   IRAN ROWE Date of Exam: 03/17/2020 Medical Rec #:  [id number]       Height:       67.0 in Accession #:    [id number]      Weight:       189.2 lb Date of Birth:  Jul 03, 1941      BSA:          1.975 m Patient Age:    74 years        BP:           131/70 mmHg Patient Gender: M               HR:           78 bpm. Exam Location:  Inpatient Procedure: 2D Echo, Color Doppler, Cardiac Doppler and Intracardiac            Opacification Agent Indications:    NSTEMI  History:        Patient has no prior history of Echocardiogram examinations.                 CHF, NSTEMI, Pacemaker; Risk Factors:Hypertension, Diabetes and                  Dyslipidemia.  Sonographer:    Raquel Sarna Senior RDCS Referring Phys: [id number] [place]  1. Definity used; anteroseptal and apical akinesis with overall moderate LV dysfunction; grade 1 diastolic dysfunction; mild LVH; mild AS (mean gradient 11 mmHg); mild MR; moderate LAE.  2. Left ventricular  ejection fraction, by estimation, is 35 to 40%. The left ventricle has moderately decreased function. The left ventricle demonstrates regional wall motion abnormalities (see scoring diagram/findings for description). There is mild left ventricular hypertrophy. Left ventricular diastolic parameters are consistent with Grade I diastolic dysfunction (impaired relaxation). Elevated left atrial pressure.  3. Right ventricular systolic function is normal. The right ventricular size is normal. There is moderately elevated pulmonary artery systolic pressure.  4. Left atrial size was moderately dilated.  5. The mitral valve is normal in structure. Mild mitral valve regurgitation. No evidence of mitral stenosis.  6. The aortic valve is tricuspid. Aortic valve regurgitation is not visualized. Mild aortic valve stenosis.  7. The inferior vena cava is dilated in size with <50% respiratory variability, suggesting right atrial pressure of 15 mmHg. FINDINGS  Left Ventricle: Left ventricular ejection fraction, by estimation, is 35 to 40%. The left ventricle has moderately decreased function. The left ventricle demonstrates regional wall motion abnormalities. Definity contrast agent was given IV to delineate the left ventricular endocardial borders. The left ventricular internal cavity size was normal in size. There is mild left ventricular hypertrophy. Left ventricular diastolic parameters are consistent with Grade I diastolic dysfunction (impaired relaxation). Elevated left atrial pressure. Right Ventricle: The right ventricular size is normal. Right ventricular systolic function is normal. There is moderately elevated  pulmonary artery systolic pressure. The tricuspid regurgitant velocity is 2.89 m/s, and with an assumed right atrial pressure of 15 mmHg, the estimated right ventricular systolic pressure is 89.2 mmHg. Left Atrium: Left atrial size was moderately dilated. Right Atrium: Right atrial size was normal in size. Pericardium: There is no evidence of pericardial effusion. Mitral Valve: The mitral valve is normal in structure. Normal mobility of the mitral valve leaflets. Mild mitral annular calcification. Mild mitral valve regurgitation. No evidence of mitral valve stenosis. Tricuspid Valve: The tricuspid valve is normal in structure. Tricuspid valve regurgitation is mild . No evidence of tricuspid stenosis. Aortic Valve: The aortic valve is tricuspid. Aortic valve regurgitation is not visualized. Mild aortic stenosis is present. Pulmonic Valve: The pulmonic valve was not well visualized. Pulmonic valve regurgitation is not visualized. No evidence of pulmonic stenosis. Aorta: The aortic root is normal in size and structure. Venous: The inferior vena cava is dilated in size with less than 50% respiratory variability, suggesting right atrial pressure of 15 mmHg. IAS/Shunts: No atrial level shunt detected by color flow Doppler. Additional Comments: Definity used; anteroseptal and apical akinesis with overall moderate LV dysfunction; grade 1 diastolic dysfunction; mild LVH; mild AS (mean gradient 11 mmHg); mild MR; moderate LAE. A pacer wire is visualized.  LEFT VENTRICLE PLAX 2D LVIDd:         3.90 cm  Diastology LVIDs:         2.60 cm  LV e' lateral:   5.00 cm/s LV PW:         1.20 cm  LV E/e' lateral: 18.3 LV IVS:        1.20 cm  LV e' medial:    4.24 cm/s LVOT diam:     1.90 cm  LV E/e' medial:  21.5 LV SV:         67 LV SV Index:   34 LVOT Area:     2.84 cm  RIGHT VENTRICLE RV S prime:     9.25 cm/s TAPSE (M-mode): 1.9 cm LEFT ATRIUM             Index  RIGHT ATRIUM           Index LA diam:        4.30 cm 2.18 cm/m   RA Area:     19.10 cm LA Vol (A2C):   80.8 ml 40.91 ml/m RA Volume:   53.90 ml  27.29 ml/m LA Vol (A4C):   99.6 ml 50.43 ml/m LA Biplane Vol: 94.2 ml 47.70 ml/m  AORTIC VALVE LVOT Vmax:   106.00 cm/s LVOT Vmean:  79.900 cm/s LVOT VTI:    0.236 m MITRAL VALVE                TRICUSPID VALVE MV Area (PHT): 3.77 cm     TR Peak grad:   33.4 mmHg MV Decel Time: 201 msec     TR Vmax:        289.00 cm/s MV E velocity: 91.30 cm/s MV A velocity: 128.00 cm/s  SHUNTS MV E/A ratio:  0.71         Systemic VTI:  0.24 m                             Systemic Diam: 1.90 cm Kirk Ruths MD Electronically signed by Kirk Ruths MD Signature Date/Time: 03/17/2020/1:45:05 PM    Final    CT Renal Stone Study  Result Date: 02/26/2020 CLINICAL DATA:  Low back pain.  LEFT flank pain for 3 weeks. EXAM: CT ABDOMEN AND PELVIS WITHOUT CONTRAST TECHNIQUE: Multidetector CT imaging of the abdomen and pelvis was performed following the standard protocol without IV contrast. COMPARISON:  CT 03/03/2012 FINDINGS: Lower chest: Lung bases are clear. Hepatobiliary: No focal hepatic lesion. No biliary duct dilatation. Gallbladder is normal. Common bile duct is normal. Pancreas: Pancreas is normal. No ductal dilatation. No pancreatic inflammation. Spleen: Normal spleen Adrenals/urinary tract: Adrenal glands normal. Two nonobstructing calculi in the LEFT kidney measuring 4 mm each. Punctate calcification in the RIGHT kidney measuring 1 mm. No ureterolithiasis or obstructive uropathy. Bladder normal. Stomach/Bowel: Stomach, small-bowel and cecum are normal. The appendix is not identified but there is no pericecal inflammation to suggest appendicitis. The colon and rectosigmoid colon are normal. Vascular/Lymphatic: Calcification of the abdominal aorta. Normal caliber. There is bulky retroperitoneal periaortic lymphadenopathy which extends to the common iliac vessels. Lymph nodes surround the aorta and IVC. Lymph node mass on the LEFT at the level of  the kidney measures 5.7 x 5.6 cm. LEFT common iliac lymph node measures 2.6 cm (image 43/2). LEFT external iliac lymph node measures 2.8 cm. No inguinal adenopathy. Reproductive: Post prostatectomy Other: No free fluid. Musculoskeletal: No aggressive osseous lesion. IMPRESSION: 1. Bulky retroperitoneal periaortic lymphadenopathy extending to the iliac nodal chains most consistent with LYMPHOMA. 2. Most inferior lymph node is a LEFT external iliac lymph node. 3. Normal volume spleen. 4. Bilateral nephrolithiasis. 5.  Aortic Atherosclerosis (ICD10-I70.0). Findings conveyed toPEDRO CARDAMA on 02/26/2020  at06:22. Electronically Signed   By: Suzy Bouchard M.D.   On: 02/26/2020 06:22   IR IMAGING GUIDED PORT INSERTION  Result Date: 03/20/2020 CLINICAL DATA:  METASTATIC RENAL CELL CARCINOMA EXAM: RIGHT INTERNAL JUGULAR SINGLE LUMEN POWER PORT CATHETER INSERTION Date:  03/20/2020 03/20/2020 9:32 am Radiologist:  M. Daryll Brod, MD Guidance:  Ultrasound and fluoroscopic MEDICATIONS: Patient is already receiving IV antibiotics as an inpatient ANESTHESIA/SEDATION: Versed 1.0 mg IV; Fentanyl 50 mcg IV; Moderate Sedation Time:  29 minutes The patient was continuously monitored during the procedure by the interventional radiology nurse under  my direct supervision. FLUOROSCOPY TIME:  0 minutes, 42 seconds (3 mGy) COMPLICATIONS: None immediate. CONTRAST:  None. PROCEDURE: Informed consent was obtained from the patient following explanation of the procedure, risks, benefits and alternatives. The patient understands, agrees and consents for the procedure. All questions were addressed. A time out was performed. Maximal barrier sterile technique utilized including caps, mask, sterile gowns, sterile gloves, large sterile drape, hand hygiene, and 2% chlorhexidine scrub. Under sterile conditions and local anesthesia, right internal jugular micropuncture venous access was performed. Access was performed with ultrasound. Images were  obtained for documentation of the patent right internal jugular vein. A guide wire was inserted followed by a transitional dilator. This allowed insertion of a guide wire and catheter into the IVC. Measurements were obtained from the SVC / RA junction back to the right IJ venotomy site. In the right infraclavicular chest, a subcutaneous pocket was created over the second anterior rib. This was done under sterile conditions and local anesthesia. 1% lidocaine with epinephrine was utilized for this. A 2.5 cm incision was made in the skin. Blunt dissection was performed to create a subcutaneous pocket over the right pectoralis major muscle. The pocket was flushed with saline vigorously. There was adequate hemostasis. The port catheter was assembled and checked for leakage. The port catheter was secured in the pocket with two retention sutures. The tubing was tunneled subcutaneously to the right venotomy site and inserted into the SVC/RA junction through a valved peel-away sheath. Position was confirmed with fluoroscopy. Images were obtained for documentation. The patient tolerated the procedure well. No immediate complications. Incisions were closed in a two layer fashion with 4 - 0 Vicryl suture. Dermabond was applied to the skin. The port catheter was accessed, blood was aspirated followed by saline and heparin flushes. Needle was removed. A dry sterile dressing was applied. IMPRESSION: Ultrasound and fluoroscopically guided right internal jugular single lumen power port catheter insertion. Tip in the SVC/RA junction. Catheter ready for use. Electronically Signed   By: [name].  Shick M.D.   On: 03/20/2020 09:56     TODAY-DAY OF DISCHARGE:  Subjective:   Kenneth Lyons today has no headache,no chest abdominal pain,no new weakness tingling or numbness, feels much better wants to go home today.   Objective:   Blood pressure (!) 147/65, pulse 74, temperature 97.9 F (36.6 C), temperature source Oral, resp. rate  14, height 5\' 7"  (1.702 m), weight 85.8 kg, SpO2 94 %.  Intake/Output Summary (Last 24 hours) at 03/20/2020 1135 Last data filed at 03/20/2020 1000 Gross per 24 hour  Intake 580 ml  Output 1100 ml  Net -520 ml   Filed Weights   03/17/20 0113 03/17/20 0600  Weight: 83.9 kg 85.8 kg    Exam: Awake Alert, Oriented *3, No new F.N deficits, Normal affect [place].AT,PERRAL Supple Neck,No JVD, No cervical lymphadenopathy appriciated.  Symmetrical Chest wall movement, Good air movement bilaterally, CTAB RRR,No Gallops,Rubs or new Murmurs, No Parasternal Heave +ve B.Sounds, Abd Soft, Non tender, No organomegaly appriciated, No rebound -guarding or rigidity. No Cyanosis, Clubbing or edema, No new Rash or bruise   PERTINENT RADIOLOGIC STUDIES: IR IMAGING GUIDED PORT INSERTION  Result Date: 03/20/2020 CLINICAL DATA:  METASTATIC RENAL CELL CARCINOMA EXAM: RIGHT INTERNAL JUGULAR SINGLE LUMEN POWER PORT CATHETER INSERTION Date:  03/20/2020 03/20/2020 9:32 am Radiologist:  M. Daryll Brod, MD Guidance:  Ultrasound and fluoroscopic MEDICATIONS: Patient is already receiving IV antibiotics as an inpatient ANESTHESIA/SEDATION: Versed 1.0 mg IV; Fentanyl 50 mcg IV; Moderate Sedation Time:  29 minutes The patient was continuously monitored during the procedure by the interventional radiology nurse under my direct supervision. FLUOROSCOPY TIME:  0 minutes, 42 seconds (3 mGy) COMPLICATIONS: None immediate. CONTRAST:  None. PROCEDURE: Informed consent was obtained from the patient following explanation of the procedure, risks, benefits and alternatives. The patient understands, agrees and consents for the procedure. All questions were addressed. A time out was performed. Maximal barrier sterile technique utilized including caps, mask, sterile gowns, sterile gloves, large sterile drape, hand hygiene, and 2% chlorhexidine scrub. Under sterile conditions and local anesthesia, right internal jugular micropuncture venous access was  performed. Access was performed with ultrasound. Images were obtained for documentation of the patent right internal jugular vein. A guide wire was inserted followed by a transitional dilator. This allowed insertion of a guide wire and catheter into the IVC. Measurements were obtained from the SVC / RA junction back to the right IJ venotomy site. In the right infraclavicular chest, a subcutaneous pocket was created over the second anterior rib. This was done under sterile conditions and local anesthesia. 1% lidocaine with epinephrine was utilized for this. A 2.5 cm incision was made in the skin. Blunt dissection was performed to create a subcutaneous pocket over the right pectoralis major muscle. The pocket was flushed with saline vigorously. There was adequate hemostasis. The port catheter was assembled and checked for leakage. The port catheter was secured in the pocket with two retention sutures. The tubing was tunneled subcutaneously to the right venotomy site and inserted into the SVC/RA junction through a valved peel-away sheath. Position was confirmed with fluoroscopy. Images were obtained for documentation. The patient tolerated the procedure well. No immediate complications. Incisions were closed in a two layer fashion with 4 - 0 Vicryl suture. Dermabond was applied to the skin. The port catheter was accessed, blood was aspirated followed by saline and heparin flushes. Needle was removed. A dry sterile dressing was applied. IMPRESSION: Ultrasound and fluoroscopically guided right internal jugular single lumen power port catheter insertion. Tip in the SVC/RA junction. Catheter ready for use. Electronically Signed   By: [name].  Shick M.D.   On: 03/20/2020 09:56     PERTINENT LAB RESULTS: CBC: Recent Labs    03/20/20 0259 03/20/20 0805  WBC 8.7 8.9  HGB 9.4* 10.0*  HCT 30.6* 33.0*  PLT 538* 601*   CMET CMP     Component Value Date/Time   NA 140 03/20/2020 0805   K 3.8 03/20/2020 0805   CL 102  03/20/2020 0805   CO2 25 03/20/2020 0805   GLUCOSE 151 (H) 03/20/2020 0805   BUN 24 (H) 03/20/2020 0805   CREATININE 1.03 03/20/2020 0805   CREATININE 1.18 03/14/2020 1517   CALCIUM 9.2 03/20/2020 0805   PROT 6.4 (L) 03/18/2020 0224   ALBUMIN 2.4 (L) 03/18/2020 0224   AST 28 03/18/2020 0224   AST 17 03/14/2020 1517   ALT 18 03/18/2020 0224   ALT 11 03/14/2020 1517   ALKPHOS 45 03/18/2020 0224   BILITOT 0.4 03/18/2020 0224   BILITOT 0.4 03/14/2020 1517   GFRNONAA >60 03/20/2020 0805   GFRNONAA 59 (L) 03/14/2020 1517   GFRAA >60 03/20/2020 0805   GFRAA >60 03/14/2020 1517    GFR Estimated Creatinine Clearance: 62.9 mL/min (by C-G formula based on SCr of 1.03 mg/dL). No results for input(s): LIPASE, AMYLASE in the last 72 hours. No results for input(s): CKTOTAL, CKMB, CKMBINDEX, TROPONINI in the last 72 hours. Invalid input(s): POCBNP No results for input(s): DDIMER  in the last 72 hours. No results for input(s): HGBA1C in the last 72 hours. No results for input(s): CHOL, HDL, LDLCALC, TRIG, CHOLHDL, LDLDIRECT in the last 72 hours. No results for input(s): TSH, T4TOTAL, T3FREE, THYROIDAB in the last 72 hours.  Invalid input(s): FREET3 Recent Labs    03/19/20 0744  RETICCTPCT 2.0   Coags: Recent Labs    03/20/20 0805  INR 1.3*   Microbiology: Recent Results (from the past 240 hour(s))  Blood culture (routine x 2)     Status: None (Preliminary result)   Collection Time: 03/16/20  2:17 AM   Specimen: BLOOD  Result Value Ref Range Status   Specimen Description BLOOD SITE NOT SPECIFIED  Final   Special Requests   Final    BOTTLES DRAWN AEROBIC AND ANAEROBIC Blood Culture results may not be optimal due to an excessive volume of blood received in culture bottles   Culture   Final    NO GROWTH 3 DAYS Performed at [facility], [place] [address] [place], [place] [address]    Report Status PENDING  Incomplete  Blood culture (routine x 2)     Status: None (Preliminary  result)   Collection Time: 03/17/20 12:47 AM   Specimen: BLOOD  Result Value Ref Range Status   Specimen Description BLOOD LEFT ANTECUBITAL  Final   Special Requests   Final    BOTTLES DRAWN AEROBIC AND ANAEROBIC Blood Culture results may not be optimal due to an excessive volume of blood received in culture bottles   Culture   Final    NO GROWTH 3 DAYS Performed at [facility], [place] [address], [place], [place] [address]    Report Status PENDING  Incomplete  SARS Coronavirus 2 by RT PCR (hospital order, performed in [facility] hospital lab) Nasopharyngeal Nasopharyngeal Swab     Status: None   Collection Time: 03/17/20 12:53 AM   Specimen: Nasopharyngeal Swab  Result Value Ref Range Status   SARS Coronavirus 2 NEGATIVE NEGATIVE Final    Comment: (NOTE) SARS-CoV-2 target nucleic acids are NOT DETECTED. The SARS-CoV-2 RNA is generally detectable in upper and lower respiratory specimens during the acute phase of infection. The lowest concentration of SARS-CoV-2 viral copies this assay can detect is 250 copies / mL. A negative result does not preclude SARS-CoV-2 infection and should not be used as the sole basis for treatment or other patient management decisions.  A negative result may occur with improper specimen collection / handling, submission of specimen other than nasopharyngeal swab, presence of viral mutation(s) within the areas targeted by this assay, and inadequate number of viral copies (<250 copies / mL). A negative result must be combined with clinical observations, patient history, and epidemiological information. Fact Sheet for Patients:   StrictlyIdeas.no Fact Sheet for Healthcare Providers: BankingDealers.co.za This test is not yet approved or cleared  by the [place] FDA and has been authorized for detection and/or diagnosis of SARS-CoV-2 by FDA under an Emergency Use Authorization (EUA).  This EUA will  remain in effect (meaning this test can be used) for the duration of the COVID-19 declaration under Section 564(b)(1) of the Act, 21 U.S.C. section 360bbb-3(b)(1), unless the authorization is terminated or revoked sooner. Performed at [facility], [place] [address], [place],  [address]   MRSA PCR Screening     Status: None   Collection Time: 03/17/20  8:05 AM   Specimen: Nasopharyngeal  Result Value Ref Range Status   MRSA  by PCR NEGATIVE NEGATIVE Final    Comment:        The GeneXpert MRSA Assay (FDA approved for NASAL specimens only), is one component of a comprehensive MRSA colonization surveillance program. It is not intended to diagnose MRSA infection nor to guide or monitor treatment for MRSA infections. Performed at [facility], [place] [address] [place], [place] [address]     FURTHER DISCHARGE INSTRUCTIONS:  Get Medicines reviewed and adjusted: Please take all your medications with you for your next visit with your Primary MD  Laboratory/radiological data: Please request your Primary MD to go over all hospital tests and procedure/radiological results at the follow up, please ask your Primary MD to get all Hospital records sent to his/her office.  In some cases, they will be blood work, cultures and biopsy results pending at the time of your discharge. Please request that your primary care M.D. goes through all the records of your hospital data and follows up on these results.  Also Note the following: If you experience worsening of your admission symptoms, develop shortness of breath, life threatening emergency, suicidal or homicidal thoughts you must seek medical attention immediately by calling 911 or calling your MD immediately  if symptoms less severe.  You must read complete instructions/literature along with all the possible adverse reactions/side effects for all the Medicines you take and that have been prescribed to you. Take any new Medicines  after you have completely understood and accpet all the possible adverse reactions/side effects.   Do not drive when taking Pain medications or sleeping medications (Benzodaizepines)  Do not take more than prescribed Pain, Sleep and Anxiety Medications. It is not advisable to combine anxiety,sleep and pain medications without talking with your primary care practitioner  Special Instructions: If you have smoked or chewed Tobacco  in the last 2 yrs please stop smoking, stop any regular Alcohol  and or any Recreational drug use.  Wear Seat belts while driving.  Please note: You were cared for by a hospitalist during your hospital stay. Once you are discharged, your primary care physician will handle any further medical issues. Please note that NO REFILLS for any discharge medications will be authorized once you are discharged, as it is imperative that you return to your primary care physician (or establish a relationship with a primary care physician if you do not have one) for your post hospital discharge needs so that they can reassess your need for medications and monitor your lab values.  Total Time spent coordinating discharge including counseling, education and face to face time equals 35 minutes.  SignedOren Binet 03/20/2020 11:35 AM

## 2020-03-20 NOTE — TOC Initial Note (Addendum)
Transition of Care [facility]) - Initial/Assessment Note    Patient Details  Name: Kenneth Lyons MRN: [id number] Date of Birth: 07/22/1941  Transition of Care [facility] CM/SW Contact:    Zenon Mayo, RN Phone Number: 03/20/2020, 1:39 PM  Clinical Narrative:                 Patient is for dc today, NCM offered choice for HHPT,HHOT, wife chose [place], NCM made referral to [facility] with [facility] , he is able to take referral.  Soc will begin 24 to 48 hrs post dc. He has transportation at [employer].     Expected Discharge Plan: [place] Barriers to Discharge: No Barriers Identified   Patient Goals and CMS Choice Patient states their goals for this hospitalization and ongoing recovery are:: get better CMS Medicare.gov Compare Post Acute Care list provided to:: Patient Represenative (must comment) Choice offered to / list presented to : Spouse  Expected Discharge Plan and Services Expected Discharge Plan: [place] In-house Referral: NA Discharge Planning Services: CM Consult Post Acute Care Choice: [place] arrangements for the past 2 months: Single Family Home Expected Discharge Date: 03/20/20               DME Arranged: Hospital bed DME Agency: [employer] Date DME Agency Contacted: 03/20/20 Time DME Agency Contacted: [phone] Representative spoke with at DME Agency: Thedore Mins HH Arranged: PT, OT [place] Agency: [place] Date [place]: 03/20/20 Time [place]: [phone] Representative spoke with at [place]: [place] Arrangements/Services Living arrangements for the past 2 months: [place] Lives with:: Spouse Patient language and need for interpreter reviewed:: Yes Do you feel safe going back to the place where you live?: Yes      Need for Family Participation in Patient Care: Yes (Comment) Care giver support system in place?: Yes (comment)   Criminal Activity/Legal Involvement Pertinent to  Current Situation/Hospitalization: No - Comment as needed  Activities of Daily Living      Permission Sought/Granted                  Emotional Assessment Appearance:: Appears stated age Attitude/Demeanor/Rapport: Engaged Affect (typically observed): Appropriate Orientation: : Oriented to Place, Oriented to Self, Oriented to  Time, Oriented to Situation Alcohol / Substance Use: Not Applicable Psych Involvement: No (comment)  Admission diagnosis:  Pneumonia [J18.9] Pulmonary infiltrates [R91.8] Hyperglycemia [R73.9] Acute respiratory failure with hypoxia ([place]) [J96.01] Patient Active Problem List   Diagnosis Date Noted  . Acute on chronic systolic CHF (congestive heart failure), NYHA class 3 ([place])   . Pressure injury of skin 03/18/2020  . Acute respiratory failure with hypoxia ([place]) 03/17/2020  . Acute on chronic diastolic CHF (congestive heart failure) ([place]) 03/17/2020  . NSTEMI (non-ST elevated myocardial infarction) ([place]) 03/17/2020  . Sepsis with acute hypoxic respiratory failure without septic shock ([place]) 03/17/2020  . Mixed hyperlipidemia due to type 2 diabetes mellitus ([place]) 03/17/2020  . Kidney cancer, primary, with metastasis from kidney to other site, left ([place]) 03/14/2020  . Anemia associated with left renal cell cancer treated with erythropoietin ([place]) 03/14/2020  . 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 Hypercalcemia of malignancy 03/14/2020  . Iron deficiency anemia due to chronic blood loss 02/29/2020  . Goals of care, counseling/discussion 02/28/2020  . Stroke ([place]) 10/20/2019  . Diabetes mellitus ([place]) 10/20/2019  . 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 Type 2 diabetes mellitus without retinopathy ([place]) 08/05/2018  . Tricuspid regurgitation 05/19/2018  . Hyperplastic rectal polyp 02/22/2018  . Dermatochalasis of both upper eyelids  08/04/2017  . Epiretinal membrane (ERM) of right eye 08/04/2017  . Keratoconjunctivitis sicca of both eyes not specified as Sjogren's 08/04/2017  . Pinguecula, left eye 08/04/2017  . Presbyopia of both eyes 08/04/2017  . Vitreous floater, bilateral 08/04/2017  . Dementia ([place]) 06/03/2016  . Controlled type 2 diabetes mellitus with microalbuminuria, without long-term current use of insulin ([place]) 04/16/2016  . Benign paroxysmal positional vertigo due to bilateral vestibular disorder 04/16/2016  . Pacemaker reprogramming/check 04/16/2016  . Essential hypertension 10/29/2015  . H/O malignant neoplasm of prostate 10/29/2015  . H/O: CVA (cerebrovascular accident) 10/29/2015  . Atrioventricular block, complete ([place]) 10/29/2015  . Carotid bruit 10/29/2015  . Cervical spondylosis without myelopathy 10/29/2015  . DDD (degenerative disc disease), lumbosacral 10/29/2015  . Diverticulosis of colon 10/29/2015  . Fredrickson type IIb or III hyperlipoproteinemia 10/29/2015  . Hearing loss 10/29/2015  . History of benign neoplasm of colon 10/29/2015  . Hypertriglyceridemia 10/29/2015  . Osteoarthritis, knee 10/29/2015  . Right knee pain 10/29/2015  . Pseudophakia of both eyes 10/29/2015   PCP:  Nicola Girt, DO Pharmacy:   [facility] [address] [place], [place] - [address] [place] [address] Phone: [phone] Fax: [place], [place] [address] Phone: [phone] Fax: [phone]     Social Determinants of Health (SDOH) Interventions    Readmission Risk Interventions Readmission Risk Prevention Plan 03/20/2020  Transportation Screening Complete  Medication Review ([place]) Complete  PCP or Specialist appointment within 3-5 days of discharge Complete  HRI or [place] Complete  SW Recovery Care/Counseling Consult Complete   [place] Not Applicable  Some recent data might be hidden

## 2020-03-20 NOTE — Progress Notes (Signed)
Bedrest emphasized and to avoid moving right arm. Dressing to right upper chest porta cath site dry and intact. Continue to monitor.

## 2020-03-20 NOTE — Patient Outreach (Signed)
  [facility]) Care Management Chronic Special Needs Program    03/20/2020  Name: Kenneth Lyons, DOB: 1941/03/17  MRN: [id number]   [name] is enrolled in a chronic special needs plan for Diabetes. RNCM received notification that client was admitted to [facility] on 03/16/20 for severe hypoxemia, anemia, and pneumonia requiring BiPAP after 2 units of PRBC transfusion. Individualized care plan sent to [facility] utilization management department  PLAN; [facility] will continue to follow as client's C-SNP care management coordinator and follow up regarding client's discharge disposition.   Quinn Plowman RN,BSN,CCM [facility] Care Management [phone]

## 2020-03-20 NOTE — Procedures (Signed)
Interventional Radiology Procedure Note  Procedure: RT IJ POWER PORT  Complications: None  Estimated Blood Loss: MIN  Findings: TIP SVCRA       

## 2020-03-20 NOTE — Progress Notes (Signed)
Back from IR by bed awake and alert.

## 2020-03-20 NOTE — Progress Notes (Signed)
Mr. Diana is doing okay this morning.  His hemoglobin is 9.4.  Platelet count 538,000.  I think he did get a dose of IV iron yesterday.  Hopefully, he will have the Port-A-Cath placed today as an inpatient and then go home.  He sat up in a chair yesterday.  He had no problems with this.  He is not wearing oxygen.  His oxygen saturation is 98%.  There is no swelling in his lower legs.  He has a compression stockings on.  He is not complaining of any pain.  There is been no issues with fever.  He has had no cough.  He has had no nausea or vomiting.  His vital signs are temperature 98.  Pulse 69.  Blood pressure 160/84.  Head and neck exam shows no ocular or oral lesions.  He has no scleral icterus.  There is no adenopathy in the neck.  Lungs are clear bilaterally.  Cardiac exam regular rate and rhythm.  He has 1/6 systolic ejection murmur.  Abdomen is soft.  Bowel sounds are present.  There is no guarding or rebound tenderness.  Extremities shows no edema in the legs.  Neurological exam is nonfocal.  Hopefully, Mr. Hauss will get the Port-A-Cath in.  We will try to get him started on the immunotherapy portion of his protocol this week in the office.  Hopefully, he will get the Cabometyx and get that started.  I appreciate the great care that he is getting from all the staff on [place].  Lattie Haw, MD  Psalm 107:8

## 2020-03-20 NOTE — Progress Notes (Signed)
Per Dr Antonieta Pert request, [facility] testing request sent on  Specimen [phone]  DOS 03/08/2020

## 2020-03-20 NOTE — Progress Notes (Signed)
Physical Therapy Treatment Patient Details Name: Kenneth Lyons MRN: [id number] DOB: [birth year] Today's Date: 03/20/2020    History of Present Illness 79 y.o. admitted on  5/28 after developing shortness of breath post 2 units packed RBC in preparation for chemo starting on 03/21/20. PMH stage IV metastatic renal cell carcinoma, hypertension, previous CVA, diabetes mellitus type 2, Alzheimer's dementia, complete heart block status post pacemaker placement, hyperlipidemia, mild aortic stenosis, diastolic congestive heart failure.    PT Comments    Pt able to amb >200 feet with rollator supervision assist. Attempted to amb without rollator, pt required single UE assistance holding onto the bed rail. Pt held onto R rail and completed 5 standing marches to mimic stairs, wife was educated on importance of assistance and being present with stair negotiation. Educated wife and patient on importance of using assistive device when pt returns home and its use in regard to energy conservation. Wife expressed that she is interested in home health physical therapy rather than outpatient, d/c recommendations changed. Will continue to follow acutely until d/c home.   Follow Up Recommendations  Home health PT     Equipment Recommendations  None recommended by PT    Recommendations for Other Services       Precautions / Restrictions Precautions Precautions: Fall Restrictions Weight Bearing Restrictions: No    Mobility  Bed Mobility               General bed mobility comments: Pt received in recliner  Transfers Overall transfer level: Needs assistance Equipment used: None Transfers: Sit to/from Stand Sit to Stand: Supervision         General transfer comment: Assist for lines/safety, pt required supervision for safety  Ambulation/Gait Ambulation/Gait assistance: Supervision Gait Distance (Feet): 225 Feet Assistive device: None;4-wheeled walker Gait Pattern/deviations: Step-through  pattern;Decreased stride length;Trunk flexed Gait velocity: decreased   General Gait Details: Amb 8 feet without DME, pt required use of single UE on bed rail with R lateral lean. For rest of amb use rollator, pt was able to amb with increased stability and safety.   Stairs             Wheelchair Mobility    Modified Rankin (Stroke Patients Only)       Balance Overall balance assessment: Needs assistance Sitting-balance support: No upper extremity supported;Feet supported Sitting balance-Leahy Scale: Good     Standing balance support: No upper extremity supported;During functional activity Standing balance-Leahy Scale: Good Standing balance comment: Pt demonstrates good standing balance, pt required assistive device for safety with amb.                            Cognition Arousal/Alertness: Awake/alert Behavior During Therapy: WFL for tasks assessed/performed Overall Cognitive Status: History of cognitive impairments - at baseline Area of Impairment: Attention;Memory;Following commands;Safety/judgement;Awareness;Problem solving                     Memory: Decreased short-term memory Following Commands: Follows multi-step commands inconsistently;Follows one step commands consistently;Follows multi-step commands with increased time Safety/Judgement: Decreased awareness of safety;Decreased awareness of deficits   Problem Solving: Requires verbal cues;Requires tactile cues General Comments: Pt able to follow single and multi step commands with verbal and tactile cues. Pt demonstrates poor recall, wife able to communicate and clarify agreement with d/c recommendations and equipment.      Exercises      General Comments General comments (skin integrity, edema, etc.): VSS on RA.  Pertinent Vitals/Pain Pain Assessment: No/denies pain    Home Living                      Prior Function            PT Goals (current goals can now be  found in the care plan section) Acute Rehab PT Goals Patient Stated Goal: go home PT Goal Formulation: With patient Time For Goal Achievement: 03/31/20 Potential to Achieve Goals: Good Progress towards PT goals: Progressing toward goals    Frequency    Min 3X/week      PT Plan Discharge plan needs to be updated    Co-evaluation              AM-PAC PT "6 Clicks" Mobility   Outcome Measure  Help needed turning from your back to your side while in a flat bed without using bedrails?: None Help needed moving from lying on your back to sitting on the side of a flat bed without using bedrails?: None Help needed moving to and from a bed to a chair (including a wheelchair)?: None Help needed standing up from a chair using your arms (e.g., wheelchair or bedside chair)?: None Help needed to walk in hospital room?: None Help needed climbing 3-5 steps with a railing? : A Little 6 Click Score: 23    End of Session Equipment Utilized During Treatment: Gait belt Activity Tolerance: Patient tolerated treatment well Patient left: in chair;with call bell/phone within reach;with family/visitor present Nurse Communication: Mobility status PT Visit Diagnosis: Other abnormalities of gait and mobility (R26.89);Muscle weakness (generalized) (M62.81)     Time: 4356-8616 PT Time Calculation (min) (ACUTE ONLY): 26 min  Charges:  $Gait Training: 8-22 mins $Therapeutic Exercise: 8-22 mins                     [employer] SPT 03/20/2020    Rolland Porter 03/20/2020, 2:32 PM

## 2020-03-20 NOTE — Telephone Encounter (Signed)
° °  Please place a TOC call to patient. He has a post-hospital follow-up appointment with Roby Lofts, PA-C 03/26/20 at 3:15pm. He is being discharged home 03/20/20.    Thank you!

## 2020-03-20 NOTE — Progress Notes (Signed)
    Durable Medical Equipment  (From admission, onward)         Start     Ordered   Unscheduled  For home use only DME Hospital bed  Once    Question Answer Comment  Length of Need Lifetime   Patient has (list medical condition): CHF   The above medical condition requires: Patient requires the ability to reposition frequently   Head must be elevated greater than: 30 degrees   Bed type Semi-electric   Trapeze Bar Yes   Support Surface: Gel Overlay      03/20/20 1111

## 2020-03-20 NOTE — Progress Notes (Signed)
Oncology Nurse Navigator Documentation  Oncology Nurse Navigator Flowsheets 03/20/2020  Abnormal Finding Date -  Diagnosis Status -  Planned Course of Treatment -  Phase of Treatment -  Chemotherapy Pending- Reason: -  Navigator Follow Up Date: 03/22/2020  Navigator Follow Up Reason: Chemotherapy  Navigator Location [facility]  Referral Date to RadOnc/MedOnc -  Navigator Encounter Type Telephone  Telephone Incoming Call;Appt Confirmation/Clarification;Medication Assistance  Patient Visit Type -  Treatment Phase Pre-Tx/Tx Discussion  Visual merchandiser for Upcoming Surgery/ Treatment  Interventions Education;Medication Assistance;Psycho-Social Support  Acuity Level 2-Minimal Needs (1-2 Barriers Identified)  Referrals -  Coordination of Care -  Education Method Verbal  Support Groups/Services Friends and Family  Time Spent with Patient 30

## 2020-03-20 NOTE — Progress Notes (Signed)
Pharmacy Antibiotic Note  Kenneth Lyons is a 79 y.o. male admitted on 03/16/2020 with ACS and concern for sepsis/PNA. Pharmacy has been consulted for cefepime dosing. AKI on admit resolved, Cr now stable. Cultures negative, WBC wnl, pt afebrile.  Plan: Continue cefepime 2g IV q12h   Height: 5\' 7"  (170.2 cm) Weight: 85.8 kg (189 lb 2.5 oz) IBW/kg (Calculated) : 66.1  Temp (24hrs), Avg:97.9 F (36.6 C), Min:97.7 F (36.5 C), Max:98.2 F (36.8 C)  Recent Labs  Lab 03/14/20 1517 03/16/20 2253 03/17/20 0058 03/17/20 0422 03/17/20 0757 03/18/20 0224 03/19/20 0744 03/20/20 0259  WBC 8.3 13.3*  --   --  10.0 7.4 8.4 8.7  CREATININE 1.18 1.25*  --   --  1.13 1.16 1.00  --   LATICACIDVEN  --   --  1.1 1.1  --   --   --   --     Estimated Creatinine Clearance: 64.8 mL/min (by C-G formula based on SCr of 1 mg/dL).    No Known Allergies   Arrie Senate, PharmD, BCPS Clinical Pharmacist [phone] Please check AMION for all [place] numbers 03/20/2020

## 2020-03-20 NOTE — TOC Transition Note (Signed)
Transition of Care [facility]) - CM/SW Discharge Note   Patient Details  Name: Kenneth Lyons MRN: [id number] Date of Birth: [birth year]  Transition of [facility]) CM/SW Contact:  Zenon Mayo, RN Phone Number: 03/20/2020, 1:41 PM   Clinical Narrative:    Patient is for dc today, NCM offered choice for HHPT,HHOT, wife chose [place], NCM made referral to [facility] with [facility] , he is able to take referral.  Soc will begin 24 to 48 hrs post dc. He has transportation at [employer]   Final next level of care: [place] Barriers to Discharge: No Barriers Identified   Patient Goals and CMS Choice Patient states their goals for this hospitalization and ongoing recovery are:: get better CMS Medicare.gov Compare Post Acute Care list provided to:: Patient Represenative (must comment) Choice offered to / list presented to : Spouse  Discharge Placement                       Discharge Plan and Services In-house Referral: NA Discharge Planning Services: CM Consult Post Acute Care Choice: Home Health          DME Arranged: Hospital bed DME Agency: [employer] Date DME Agency Contacted: 03/20/20 Time DME Agency Contacted: [id number] Representative spoke with at DME Agency: [place]: PT, OT [place] Agency: [place] Date [place]: 03/20/20 Time [place]: 1339 Representative spoke with at [place]: [place] ([place]) Interventions     Readmission Risk Interventions Readmission Risk Prevention Plan 03/20/2020  Transportation Screening Complete  Medication Review Press photographer) Complete  PCP or Specialist appointment within 3-5 days of discharge Complete  HRI or [place] Complete  SW Recovery Care/Counseling Consult Complete  [place] Not Applicable  Some recent data might be hidden

## 2020-03-20 NOTE — Progress Notes (Signed)
Transported to IR by bed for porta cath insertion awake and alert.

## 2020-03-20 NOTE — Progress Notes (Signed)
Discharged instructions given to pt. And wife. Belongings taken home.

## 2020-03-21 ENCOUNTER — Other Ambulatory Visit: Payer: Self-pay | Admitting: Hematology & Oncology

## 2020-03-21 ENCOUNTER — Telehealth: Payer: Self-pay | Admitting: Pharmacy Technician

## 2020-03-21 DIAGNOSIS — Z950 Presence of cardiac pacemaker: Secondary | ICD-10-CM | POA: Diagnosis not present

## 2020-03-22 ENCOUNTER — Other Ambulatory Visit: Payer: Self-pay

## 2020-03-22 ENCOUNTER — Other Ambulatory Visit: Payer: Self-pay | Admitting: Hematology & Oncology

## 2020-03-22 ENCOUNTER — Inpatient Hospital Stay: Payer: HMO

## 2020-03-22 ENCOUNTER — Encounter: Payer: Self-pay | Admitting: *Deleted

## 2020-03-22 ENCOUNTER — Inpatient Hospital Stay: Payer: HMO | Attending: Hematology & Oncology

## 2020-03-22 VITALS — BP 138/54 | HR 70 | Temp 97.5°F | Resp 16

## 2020-03-22 DIAGNOSIS — K909 Intestinal malabsorption, unspecified: Secondary | ICD-10-CM | POA: Diagnosis not present

## 2020-03-22 DIAGNOSIS — C642 Malignant neoplasm of left kidney, except renal pelvis: Secondary | ICD-10-CM

## 2020-03-22 DIAGNOSIS — R531 Weakness: Secondary | ICD-10-CM | POA: Insufficient documentation

## 2020-03-22 DIAGNOSIS — Z95828 Presence of other vascular implants and grafts: Secondary | ICD-10-CM

## 2020-03-22 DIAGNOSIS — Z79899 Other long term (current) drug therapy: Secondary | ICD-10-CM | POA: Insufficient documentation

## 2020-03-22 DIAGNOSIS — R609 Edema, unspecified: Secondary | ICD-10-CM | POA: Diagnosis not present

## 2020-03-22 DIAGNOSIS — D500 Iron deficiency anemia secondary to blood loss (chronic): Secondary | ICD-10-CM

## 2020-03-22 DIAGNOSIS — E611 Iron deficiency: Secondary | ICD-10-CM | POA: Diagnosis not present

## 2020-03-22 DIAGNOSIS — F0390 Unspecified dementia without behavioral disturbance: Secondary | ICD-10-CM | POA: Insufficient documentation

## 2020-03-22 DIAGNOSIS — C649 Malignant neoplasm of unspecified kidney, except renal pelvis: Secondary | ICD-10-CM

## 2020-03-22 DIAGNOSIS — C778 Secondary and unspecified malignant neoplasm of lymph nodes of multiple regions: Secondary | ICD-10-CM | POA: Insufficient documentation

## 2020-03-22 DIAGNOSIS — D630 Anemia in neoplastic disease: Secondary | ICD-10-CM | POA: Insufficient documentation

## 2020-03-22 LAB — CULTURE, BLOOD (ROUTINE X 2)
Culture: NO GROWTH
Culture: NO GROWTH

## 2020-03-22 LAB — CMP (CANCER CENTER ONLY)
ALT: 17 U/L (ref 0–44)
AST: 15 U/L (ref 15–41)
Albumin: 3.4 g/dL — ABNORMAL LOW (ref 3.5–5.0)
Alkaline Phosphatase: 54 U/L (ref 38–126)
Anion gap: 6 (ref 5–15)
BUN: 28 mg/dL — ABNORMAL HIGH (ref 8–23)
CO2: 30 mmol/L (ref 22–32)
Calcium: 9.7 mg/dL (ref 8.9–10.3)
Chloride: 104 mmol/L (ref 98–111)
Creatinine: 1.04 mg/dL (ref 0.61–1.24)
GFR, Est AFR Am: >60 mL/min (ref >60)
GFR, Estimated: >60 mL/min (ref >60)
Glucose, Bld: 141 mg/dL — ABNORMAL HIGH (ref 70–99)
Potassium: 4.3 mmol/L (ref 3.5–5.1)
Sodium: 140 mmol/L (ref 135–145)
Total Bilirubin: 0.3 mg/dL (ref 0.3–1.2)
Total Protein: 6.4 g/dL — ABNORMAL LOW (ref 6.5–8.1)

## 2020-03-22 LAB — CBC WITH DIFFERENTIAL (CANCER CENTER ONLY)
Abs Immature Granulocytes: 0.06 10*3/uL (ref 0.00–0.07)
Basophils Absolute: 0.1 10*3/uL (ref 0.0–0.1)
Basophils Relative: 1 %
Eosinophils Absolute: 0.3 10*3/uL (ref 0.0–0.5)
Eosinophils Relative: 3 %
HCT: 30.4 % — ABNORMAL LOW (ref 39.0–52.0)
Hemoglobin: 9.1 g/dL — ABNORMAL LOW (ref 13.0–17.0)
Immature Granulocytes: 1 %
Lymphocytes Relative: 7 %
Lymphs Abs: 0.6 10*3/uL — ABNORMAL LOW (ref 0.7–4.0)
MCH: 24.7 pg — ABNORMAL LOW (ref 26.0–34.0)
MCHC: 29.9 g/dL — ABNORMAL LOW (ref 30.0–36.0)
MCV: 82.4 fL (ref 80.0–100.0)
Monocytes Absolute: 0.7 10*3/uL (ref 0.1–1.0)
Monocytes Relative: 7 %
Neutro Abs: 7.2 10*3/uL (ref 1.7–7.7)
Neutrophils Relative %: 81 %
Platelet Count: 461 10*3/uL — ABNORMAL HIGH (ref 150–400)
RBC: 3.69 MIL/uL — ABNORMAL LOW (ref 4.22–5.81)
RDW: 18.7 % — ABNORMAL HIGH (ref 11.5–15.5)
WBC Count: 8.9 10*3/uL (ref 4.0–10.5)
nRBC: 0 % (ref 0.0–0.2)

## 2020-03-22 MED ORDER — DENOSUMAB 120 MG/1.7ML ~~LOC~~ SOLN
SUBCUTANEOUS | Status: AC
  Filled 2020-03-22: qty 1.7

## 2020-03-22 MED ORDER — DENOSUMAB 120 MG/1.7ML ~~LOC~~ SOLN
120.0000 mg | Freq: Once | SUBCUTANEOUS | Status: AC
  Administered 2020-03-22: 120 mg via SUBCUTANEOUS

## 2020-03-22 MED ORDER — SODIUM CHLORIDE 0.9% FLUSH
10.0000 mL | INTRAVENOUS | Status: DC | PRN
  Filled 2020-03-22: qty 10

## 2020-03-22 MED ORDER — SODIUM CHLORIDE 0.9 % IV SOLN: 200.0000 mg | Freq: Once | INTRAVENOUS | Status: DC

## 2020-03-22 MED ORDER — SODIUM CHLORIDE 0.9% FLUSH
10.0000 mL | Freq: Once | INTRAVENOUS | Status: AC
  Administered 2020-03-22: 10 mL via INTRAVENOUS
  Filled 2020-03-22: qty 10

## 2020-03-22 MED ORDER — SODIUM CHLORIDE 0.9 % IV SOLN
Freq: Once | INTRAVENOUS | Status: DC
  Filled 2020-03-22: qty 250

## 2020-03-22 MED ORDER — HEPARIN SOD (PORK) LOCK FLUSH 100 UNIT/ML IV SOLN
500.0000 [IU] | Freq: Once | INTRAVENOUS | Status: AC | PRN
  Administered 2020-03-22: 500 [IU]
  Filled 2020-03-22: qty 5

## 2020-03-22 MED ORDER — SODIUM CHLORIDE 0.9 % IV SOLN
480.0000 mg | Freq: Once | INTRAVENOUS | Status: AC
  Administered 2020-03-22: 480 mg via INTRAVENOUS
  Filled 2020-03-22: qty 48

## 2020-03-22 MED ORDER — SODIUM CHLORIDE 0.9% FLUSH
10.0000 mL | INTRAVENOUS | Status: DC | PRN
  Administered 2020-03-22: 10 mL
  Filled 2020-03-22: qty 10

## 2020-03-22 MED ORDER — SODIUM CHLORIDE 0.9 % IV SOLN
Freq: Once | INTRAVENOUS | Status: AC
  Filled 2020-03-22: qty 250

## 2020-03-22 MED ORDER — HEPARIN SOD (PORK) LOCK FLUSH 100 UNIT/ML IV SOLN
500.0000 [IU] | Freq: Once | INTRAVENOUS | Status: DC | PRN
  Filled 2020-03-22: qty 5

## 2020-03-22 NOTE — Addendum Note (Signed)
Addended by: Burney Gauze R on: 03/22/2020 02:21 PM   Modules accepted: Orders

## 2020-03-22 NOTE — Progress Notes (Signed)
After teaching done it was determined would be best to switch to Opdivo instead of Keytruda.  Dr Marin Olp called patients wife and she voiced understanding.  New consent signed.  Patient in agreement since we discussed with his wife.  Side effects reviewed with patient and patient information given to patient.

## 2020-03-22 NOTE — Patient Instructions (Signed)
Implanted Port Insertion, Care After °This sheet gives you information about how to care for yourself after your procedure. Your health care provider may also give you more specific instructions. If you have problems or questions, contact your health care provider. °What can I expect after the procedure? °After the procedure, it is common to have: °· Discomfort at the port insertion site. °· Bruising on the skin over the port. This should improve over 3-4 days. °Follow these instructions at home: °Port care °· After your port is placed, you will get a manufacturer's information card. The card has information about your port. Keep this card with you at all times. °· Take care of the port as told by your health care provider. Ask your health care provider if you or a family member can get training for taking care of the port at home. A home health care nurse may also take care of the port. °· Make sure to remember what type of port you have. °Incision care ° °  ° °· Follow instructions from your health care provider about how to take care of your port insertion site. Make sure you: °? Wash your hands with soap and water before and after you change your bandage (dressing). If soap and water are not available, use hand sanitizer. °? Change your dressing as told by your health care provider. °? Leave stitches (sutures), skin glue, or adhesive strips in place. These skin closures may need to stay in place for 2 weeks or longer. If adhesive strip edges start to loosen and curl up, you may trim the loose edges. Do not remove adhesive strips completely unless your health care provider tells you to do that. °· Check your port insertion site every day for signs of infection. Check for: °? Redness, swelling, or pain. °? Fluid or blood. °? Warmth. °? Pus or a bad smell. °Activity °· Return to your normal activities as told by your health care provider. Ask your health care provider what activities are safe for you. °· Do not  lift anything that is heavier than 10 lb (4.5 kg), or the limit that you are told, until your health care provider says that it is safe. °General instructions °· Take over-the-counter and prescription medicines only as told by your health care provider. °· Do not take baths, swim, or use a hot tub until your health care provider approves. Ask your health care provider if you may take showers. You may only be allowed to take sponge baths. °· Do not drive for 24 hours if you were given a sedative during your procedure. °· Wear a medical alert bracelet in case of an emergency. This will tell any health care providers that you have a port. °· Keep all follow-up visits as told by your health care provider. This is important. °Contact a health care provider if: °· You cannot flush your port with saline as directed, or you cannot draw blood from the port. °· You have a fever or chills. °· You have redness, swelling, or pain around your port insertion site. °· You have fluid or blood coming from your port insertion site. °· Your port insertion site feels warm to the touch. °· You have pus or a bad smell coming from the port insertion site. °Get help right away if: °· You have chest pain or shortness of breath. °· You have bleeding from your port that you cannot control. °Summary °· Take care of the port as told by your health   care provider. Keep the manufacturer's information card with you at all times. °· Change your dressing as told by your health care provider. °· Contact a health care provider if you have a fever or chills or if you have redness, swelling, or pain around your port insertion site. °· Keep all follow-up visits as told by your health care provider. °This information is not intended to replace advice given to you by your health care provider. Make sure you discuss any questions you have with your health care provider. °Document Revised: 05/04/2018 Document Reviewed: 05/04/2018 °Elsevier Patient Education ©  2020 [employer] Inc. ° °

## 2020-03-22 NOTE — Patient Instructions (Signed)
Patient in chemotherapy education class with  Wife [name] and daughter.  Discussed side effects of  Keytruda and Cabometyx  which include but are not limited to, decreased appetite, fatigue, fever, allergic or infusional reaction, cough, SOB, diarrhea, elevated LFTs myalgia and arthralgias, rash, skin dryness, abdominal pain, yellow sclera in eyes, abdominal swelling, itching, urinary problems,delayed wound healing, mental changes (Chemo brain), increased risk of infections, weight loss.  Reviewed infusion room and office policy and procedure and phone numbers 24 hours x 7 days a week.  Reviewed when to call the office with any concerns or problems.  Scientist, clinical (histocompatibility and immunogenetics) given.  Discussed portacath insertion and EMLA cream administration. Chemotherapy schedule reviewed. Patient verbalized understanding of chemotherapy indications and possible side effects.  Teachback done   After teaching done it was determined would be best to switch to Opdivo instead of Keytruda.  Dr Marin Olp called patients wife and she voiced understanding.  New consent signed.  Patient in agreement since we discussed with his wife

## 2020-03-22 NOTE — Telephone Encounter (Signed)
Follow up ° ° °Patient is returning your call. Please call. ° ° ° °

## 2020-03-22 NOTE — Patient Instructions (Addendum)
Nivolumab injection What is this medicine? NIVOLUMAB (nye VOL ue mab) is a monoclonal antibody. It is used to treat colon cancer, esophageal cancer, head and neck cancer, Hodgkin lymphoma, kidney cancer, liver cancer, lung cancer, mesothelioma, melanoma, and urothelial cancer. This medicine may be used for other purposes; ask your health care provider or pharmacist if you have questions. COMMON BRAND NAME(S): Opdivo What should I tell my health care provider before I take this medicine? They need to know if you have any of these conditions:  diabetes  immune system problems  kidney disease  liver disease  lung disease  organ transplant  stomach or intestine problems  thyroid disease  an unusual or allergic reaction to nivolumab, other medicines, foods, dyes, or preservatives  pregnant or trying to get pregnant  breast-feeding How should I use this medicine? This medicine is for infusion into a vein. It is given by a health care professional in a hospital or clinic setting. A special MedGuide will be given to you before each treatment. Be sure to read this information carefully each time. Talk to your pediatrician regarding the use of this medicine in children. While this drug may be prescribed for children as young as 12 years for selected conditions, precautions do apply. Overdosage: If you think you have taken too much of this medicine contact a poison control center or emergency room at once. NOTE: This medicine is only for you. Do not share this medicine with others. What if I miss a dose? It is important not to miss your dose. Call your doctor or health care professional if you are unable to keep an appointment. What may interact with this medicine? Interactions have not been studied. Give your health care provider a list of all the medicines, herbs, non-prescription drugs, or dietary supplements you use. Also tell them if you smoke, drink alcohol, or use illegal drugs.  Some items may interact with your medicine. This list may not describe all possible interactions. Give your health care provider a list of all the medicines, herbs, non-prescription drugs, or dietary supplements you use. Also tell them if you smoke, drink alcohol, or use illegal drugs. Some items may interact with your medicine. What should I watch for while using this medicine? This drug may make you feel generally unwell. Continue your course of treatment even though you feel ill unless your doctor tells you to stop. You may need blood work done while you are taking this medicine. Do not become pregnant while taking this medicine or for 5 months after stopping it. Women should inform their doctor if they wish to become pregnant or think they might be pregnant. There is a potential for serious side effects to an unborn child. Talk to your health care professional or pharmacist for more information. Do not breast-feed an infant while taking this medicine or for 5 months after stopping it. What side effects may I notice from receiving this medicine? Side effects that you should report to your doctor or health care professional as soon as possible:  allergic reactions like skin rash, itching or hives, swelling of the face, lips, or tongue  breathing problems  blood in the urine  bloody or watery diarrhea or black, tarry stools  changes in emotions or moods  changes in vision  chest pain  cough  dizziness  feeling faint or lightheaded, falls  fever, chills  headache with fever, neck stiffness, confusion, loss of memory, sensitivity to light, hallucination, loss of contact with reality, or  seizures  joint pain  mouth sores  redness, blistering, peeling or loosening of the skin, including inside the mouth  severe muscle pain or weakness  signs and symptoms of high blood sugar such as dizziness; dry mouth; dry skin; fruity breath; nausea; stomach pain; increased hunger or thirst;  increased urination  signs and symptoms of kidney injury like trouble passing urine or change in the amount of urine  signs and symptoms of liver injury like dark yellow or brown urine; general ill feeling or flu-like symptoms; light-colored stools; loss of appetite; nausea; right upper belly pain; unusually weak or tired; yellowing of the eyes or skin  swelling of the ankles, feet, hands  trouble passing urine or change in the amount of urine  unusually weak or tired  weight gain or loss Side effects that usually do not require medical attention (report to your doctor or health care professional if they continue or are bothersome):  bone pain  constipation  decreased appetite  diarrhea  muscle pain  nausea, vomiting  tiredness This list may not describe all possible side effects. Call your doctor for medical advice about side effects. You may report side effects to FDA at 1-800-FDA-1088. Where should I keep my medicine? This drug is given in a hospital or clinic and will not be stored at home. NOTE: This sheet is a summary. It may not cover all possible information. If you have questions about this medicine, talk to your doctor, pharmacist, or health care provider.  2020 Elsevier/Gold Standard (2019-07-26 10:04:50) Denosumab injection What is this medicine? DENOSUMAB (den oh sue mab) slows bone breakdown. Prolia is used to treat osteoporosis in women after menopause and in men, and in people who are taking corticosteroids for 6 months or more. Delton See is used to treat a high calcium level due to cancer and to prevent bone fractures and other bone problems caused by multiple myeloma or cancer bone metastases. Delton See is also used to treat giant cell tumor of the bone. This medicine may be used for other purposes; ask your health care provider or pharmacist if you have questions. COMMON BRAND NAME(S): Prolia, XGEVA What should I tell my health care provider before I take this  medicine? They need to know if you have any of these conditions:  dental disease  having surgery or tooth extraction  infection  kidney disease  low levels of calcium or Vitamin D in the blood  malnutrition  on hemodialysis  skin conditions or sensitivity  thyroid or parathyroid disease  an unusual reaction to denosumab, other medicines, foods, dyes, or preservatives  pregnant or trying to get pregnant  breast-feeding How should I use this medicine? This medicine is for injection under the skin. It is given by a health care professional in a hospital or clinic setting. A special MedGuide will be given to you before each treatment. Be sure to read this information carefully each time. For Prolia, talk to your pediatrician regarding the use of this medicine in children. Special care may be needed. For Delton See, talk to your pediatrician regarding the use of this medicine in children. While this drug may be prescribed for children as young as 13 years for selected conditions, precautions do apply. Overdosage: If you think you have taken too much of this medicine contact a poison control center or emergency room at once. NOTE: This medicine is only for you. Do not share this medicine with others. What if I miss a dose? It is important not to  miss your dose. Call your doctor or health care professional if you are unable to keep an appointment. What may interact with this medicine? Do not take this medicine with any of the following medications:  other medicines containing denosumab This medicine may also interact with the following medications:  medicines that lower your chance of fighting infection  steroid medicines like prednisone or cortisone This list may not describe all possible interactions. Give your health care provider a list of all the medicines, herbs, non-prescription drugs, or dietary supplements you use. Also tell them if you smoke, drink alcohol, or use illegal  drugs. Some items may interact with your medicine. What should I watch for while using this medicine? Visit your doctor or health care professional for regular checks on your progress. Your doctor or health care professional may order blood tests and other tests to see how you are doing. Call your doctor or health care professional for advice if you get a fever, chills or sore throat, or other symptoms of a cold or flu. Do not treat yourself. This drug may decrease your body's ability to fight infection. Try to avoid being around people who are sick. You should make sure you get enough calcium and vitamin D while you are taking this medicine, unless your doctor tells you not to. Discuss the foods you eat and the vitamins you take with your health care professional. See your dentist regularly. Brush and floss your teeth as directed. Before you have any dental work done, tell your dentist you are receiving this medicine. Do not become pregnant while taking this medicine or for 5 months after stopping it. Talk with your doctor or health care professional about your birth control options while taking this medicine. Women should inform their doctor if they wish to become pregnant or think they might be pregnant. There is a potential for serious side effects to an unborn child. Talk to your health care professional or pharmacist for more information. What side effects may I notice from receiving this medicine? Side effects that you should report to your doctor or health care professional as soon as possible:  allergic reactions like skin rash, itching or hives, swelling of the face, lips, or tongue  bone pain  breathing problems  dizziness  jaw pain, especially after dental work  redness, blistering, peeling of the skin  signs and symptoms of infection like fever or chills; cough; sore throat; pain or trouble passing urine  signs of low calcium like fast heartbeat, muscle cramps or muscle pain;  pain, tingling, numbness in the hands or feet; seizures  unusual bleeding or bruising  unusually weak or tired Side effects that usually do not require medical attention (report to your doctor or health care professional if they continue or are bothersome):  constipation  diarrhea  headache  joint pain  loss of appetite  muscle pain  runny nose  tiredness  upset stomach This list may not describe all possible side effects. Call your doctor for medical advice about side effects. You may report side effects to FDA at 1-800-FDA-1088. Where should I keep my medicine? This medicine is only given in a clinic, doctor's office, or other health care setting and will not be stored at home. NOTE: This sheet is a summary. It may not cover all possible information. If you have questions about this medicine, talk to your doctor, pharmacist, or health care provider.  2020 Elsevier/Gold Standard (2018-02-12 16:10:44)

## 2020-03-22 NOTE — Patient Outreach (Signed)
  [facility]) Care Management Chronic Special Needs Program  03/22/2020  Name: Kace Hartje DOB: 1940-11-27  MRN: [id number]  Mr. Stefano Trulson is enrolled in a chronic special needs plan for Diabetes. RNCM received notification that client was discharged from [facility] on 03/20/20. Home with home health services. Reviewed and updated care plan. Client will receive EMMI General discharge calls for transition of care follow up.     Goals Addressed            This Visit's Progress   . General - Client will not be readmitted within 30 days (C-SNP)       Please follow discharge instructions and call provider if you have any questions. Please attend all follow up appointments as scheduled. Please take your medications as prescribed. Please call 24 Hour nurse advice line as needed [phone]).  Please participate with home health services.        Plan; RNCM will send updated care plan to client and primary care provider.  RNCM will continue to follow as clients C-SNP chronic care management coordinator and follow up with client in 1-2 months post hospitalization.    Quinn Plowman RN,BSN,CCM Chronic Care Management Coordinator [facility] [phone]    .

## 2020-03-22 NOTE — Progress Notes (Signed)
Oncology Nurse Navigator Documentation  Oncology Nurse Navigator Flowsheets 03/22/2020  Abnormal Finding Date -  Diagnosis Status -  Planned Course of Treatment Chemotherapy  Phase of Treatment Chemo  Chemotherapy Pending- Reason: -  Chemotherapy Actual Start Date: 03/22/2020  Navigator Follow Up Date: 04/11/2020  Navigator Follow Up Reason: Follow-up Appointment;Chemotherapy  Navigator Location [facility]  Referral Date to RadOnc/MedOnc -  Navigator Encounter Type Treatment  Telephone -  Treatment Initiated Date 03/22/2020  Patient Visit Type MedOnc  Treatment Phase First Chemo Tx  Barriers/Navigation Needs -  Education -  Interventions Psycho-Social Support;Referrals  Acuity Level 2-Minimal Needs (1-2 Barriers Identified)  Referrals Nutrition/dietician  Coordination of Care -  Education Method -  Support Groups/Services Friends and Family  Time Spent with Patient 30

## 2020-03-22 NOTE — Telephone Encounter (Signed)
Left message for pt to call.

## 2020-03-23 ENCOUNTER — Encounter: Payer: Self-pay | Admitting: *Deleted

## 2020-03-23 DIAGNOSIS — C778 Secondary and unspecified malignant neoplasm of lymph nodes of multiple regions: Secondary | ICD-10-CM | POA: Diagnosis not present

## 2020-03-23 DIAGNOSIS — I639 Cerebral infarction, unspecified: Secondary | ICD-10-CM | POA: Diagnosis not present

## 2020-03-23 DIAGNOSIS — C649 Malignant neoplasm of unspecified kidney, except renal pelvis: Secondary | ICD-10-CM | POA: Diagnosis not present

## 2020-03-23 DIAGNOSIS — L8990 Pressure ulcer of unspecified site, unspecified stage: Secondary | ICD-10-CM | POA: Diagnosis not present

## 2020-03-23 DIAGNOSIS — G301 Alzheimer's disease with late onset: Secondary | ICD-10-CM | POA: Diagnosis not present

## 2020-03-23 DIAGNOSIS — I5023 Acute on chronic systolic (congestive) heart failure: Secondary | ICD-10-CM | POA: Diagnosis not present

## 2020-03-23 LAB — T4: T4, Total: 8 ug/dL (ref 4.5–12.0)

## 2020-03-23 LAB — TSH: TSH: 1.451 u[IU]/mL (ref 0.320–4.118)

## 2020-03-23 NOTE — Progress Notes (Signed)
Patient's wife states that patient's cabometyx will arrive tomorrow and she wants to know when Argenis should start taking it. Spoke with Dr Marin Olp. He would like patient to start on Monday.  Spoke to [place] and she understands to start medication on Monday.

## 2020-03-23 NOTE — Telephone Encounter (Signed)
Oral Oncology Patient Advocate Encounter  Coordinated with wife, [name], to complete application for EASE patient assistance program in an effort to reduce patient's out of pocket expense for Cabometyx to $0.    Application completed and faxed to [phone] on 03/21/2020.   EASE patient assistance phone number for follow up is [phone].   Patient was approved to receive a free 15 day trial of Cabometyx and it will be shipped to his home.  This encounter will be updated until final determination.   [place] Patient [place] Phone [phone] Fax [phone] 03/23/2020 11:25 AM

## 2020-03-23 NOTE — Telephone Encounter (Signed)
Patient contacted regarding discharge from [facility] on 03-20-20.  Patient understands to follow up with provider kroeger on 03-26-20 at 3:15 am at [facility]. Patient understands discharge instructions? yes Patient understands medications and regiment? yes Patient understands to bring all medications to this visit? yes  Ask patient:  Are you enrolled in My Chart (yes   If no ask patient if they would like to enroll.

## 2020-03-23 NOTE — Telephone Encounter (Signed)
This encounter was created in error - please disregard.

## 2020-03-26 ENCOUNTER — Encounter: Payer: Self-pay | Admitting: Medical

## 2020-03-26 ENCOUNTER — Ambulatory Visit: Payer: HMO | Admitting: Medical

## 2020-03-26 ENCOUNTER — Other Ambulatory Visit: Payer: Self-pay

## 2020-03-26 VITALS — BP 160/78 | HR 71 | Ht 67.0 in | Wt 176.2 lb

## 2020-03-26 DIAGNOSIS — I5042 Chronic combined systolic (congestive) and diastolic (congestive) heart failure: Secondary | ICD-10-CM | POA: Diagnosis not present

## 2020-03-26 DIAGNOSIS — I700 Atherosclerosis of aorta: Secondary | ICD-10-CM | POA: Diagnosis not present

## 2020-03-26 DIAGNOSIS — F0280 Dementia in other diseases classified elsewhere without behavioral disturbance: Secondary | ICD-10-CM | POA: Diagnosis not present

## 2020-03-26 DIAGNOSIS — C778 Secondary and unspecified malignant neoplasm of lymph nodes of multiple regions: Secondary | ICD-10-CM | POA: Diagnosis not present

## 2020-03-26 DIAGNOSIS — I442 Atrioventricular block, complete: Secondary | ICD-10-CM

## 2020-03-26 DIAGNOSIS — E7800 Pure hypercholesterolemia, unspecified: Secondary | ICD-10-CM | POA: Diagnosis not present

## 2020-03-26 DIAGNOSIS — G893 Neoplasm related pain (acute) (chronic): Secondary | ICD-10-CM | POA: Diagnosis not present

## 2020-03-26 DIAGNOSIS — C649 Malignant neoplasm of unspecified kidney, except renal pelvis: Secondary | ICD-10-CM | POA: Diagnosis not present

## 2020-03-26 DIAGNOSIS — I083 Combined rheumatic disorders of mitral, aortic and tricuspid valves: Secondary | ICD-10-CM | POA: Diagnosis not present

## 2020-03-26 DIAGNOSIS — K760 Fatty (change of) liver, not elsewhere classified: Secondary | ICD-10-CM | POA: Diagnosis not present

## 2020-03-26 DIAGNOSIS — I10 Essential (primary) hypertension: Secondary | ICD-10-CM | POA: Diagnosis not present

## 2020-03-26 DIAGNOSIS — I214 Non-ST elevation (NSTEMI) myocardial infarction: Secondary | ICD-10-CM | POA: Diagnosis not present

## 2020-03-26 DIAGNOSIS — E1169 Type 2 diabetes mellitus with other specified complication: Secondary | ICD-10-CM | POA: Diagnosis not present

## 2020-03-26 DIAGNOSIS — C7989 Secondary malignant neoplasm of other specified sites: Secondary | ICD-10-CM | POA: Diagnosis not present

## 2020-03-26 DIAGNOSIS — I0981 Rheumatic heart failure: Secondary | ICD-10-CM | POA: Diagnosis not present

## 2020-03-26 DIAGNOSIS — R918 Other nonspecific abnormal finding of lung field: Secondary | ICD-10-CM | POA: Diagnosis not present

## 2020-03-26 DIAGNOSIS — Z79899 Other long term (current) drug therapy: Secondary | ICD-10-CM | POA: Diagnosis not present

## 2020-03-26 DIAGNOSIS — C642 Malignant neoplasm of left kidney, except renal pelvis: Secondary | ICD-10-CM | POA: Diagnosis not present

## 2020-03-26 DIAGNOSIS — E782 Mixed hyperlipidemia: Secondary | ICD-10-CM | POA: Diagnosis not present

## 2020-03-26 DIAGNOSIS — I5033 Acute on chronic diastolic (congestive) heart failure: Secondary | ICD-10-CM | POA: Diagnosis not present

## 2020-03-26 DIAGNOSIS — G894 Chronic pain syndrome: Secondary | ICD-10-CM | POA: Diagnosis not present

## 2020-03-26 DIAGNOSIS — D630 Anemia in neoplastic disease: Secondary | ICD-10-CM | POA: Diagnosis not present

## 2020-03-26 DIAGNOSIS — Z7984 Long term (current) use of oral hypoglycemic drugs: Secondary | ICD-10-CM | POA: Diagnosis not present

## 2020-03-26 DIAGNOSIS — K219 Gastro-esophageal reflux disease without esophagitis: Secondary | ICD-10-CM | POA: Diagnosis not present

## 2020-03-26 DIAGNOSIS — I110 Hypertensive heart disease with heart failure: Secondary | ICD-10-CM | POA: Diagnosis not present

## 2020-03-26 DIAGNOSIS — G301 Alzheimer's disease with late onset: Secondary | ICD-10-CM | POA: Diagnosis not present

## 2020-03-26 DIAGNOSIS — D500 Iron deficiency anemia secondary to blood loss (chronic): Secondary | ICD-10-CM | POA: Diagnosis not present

## 2020-03-26 DIAGNOSIS — I5020 Unspecified systolic (congestive) heart failure: Secondary | ICD-10-CM | POA: Diagnosis not present

## 2020-03-26 DIAGNOSIS — N200 Calculus of kidney: Secondary | ICD-10-CM | POA: Diagnosis not present

## 2020-03-26 MED ORDER — ENTRESTO 49-51 MG PO TABS: 1.0000 | ORAL_TABLET | Freq: Two times a day (BID) | ORAL | 6 refills | Status: DC

## 2020-03-26 NOTE — Progress Notes (Signed)
Cardiology Office Note   Date:  03/28/2020   ID:  Kenneth Lyons, DOB 13-Aug-1941, MRN [id number]  PCP:  Nicola Girt, DO  Cardiologist:  Evalina Field, MD EP: None  Chief Complaint  Patient presents with  . Hospitalization Follow-up    CHF      History of Present Illness: Kenneth Lyons is a 79 y.o. male with recently diagnosed acute combined CHF, CHB s/p PPM, HTN, aortic stenosis, DM type 2, recently diagnose RCC now on immunotherapy and oral chemotherapy, and dementia, who presents for post-hospital follow-up.   He was last evaluated by cardiology during a recent admission to the hospital from 03/16/20-03/20/20 for acute respiratory failure after receiving 2 [facility] transfusion at an outpatient infusion center. He was found to have elevated troponins (peaked at 1650s) and echo revealed EF 35-40%, G1DD, and anteroseptal/apical akinesis. Unfortunately given ongoing anemia associated with his recent cancer diagnoses management was limited. He was diuresed wit IV lasix and started on carvedilol, losartan, po lasix, and spironolactone at discharge with plans to undergo an ischemic evaluation outpatient once anemia stabilized.    He presents with his wife today for post-hospital follow-up.  Weight has continued to decrease since discharge (per home records: 183lbs at discharge, down to 175lbs today). Appetite is increasing. Not sleeping well; waking up frequently. Suggested melatonin. He has had improvement in his LE edema with compression stocking use and melatonin. He is wearing his thigh high compression stockings today. Wife reports LE edema quickly rebounds when stockings are removed for showers. Blood pressures are typically in the 150s-170s/70s-90s at home. No complaints of chest pain, SOB, DOE, palpitations. He started immunotherapy for management of his RCC with plans of infusions every 3 weeks, in addition to his daily Cabometyx (oral chemo).    Past Medical History:    Diagnosis Date  . Alzheimer's dementia ([place])   . Anemia associated with left renal cell cancer treated with erythropoietin ([place]) 03/14/2020  . Cancer ([place])   . Diabetes mellitus   . GERD (gastroesophageal reflux disease)   . Goals of care, counseling/discussion 02/28/2020  . High cholesterol   . Hypercalcemia of malignancy 03/14/2020  . Hypertension   . Iron deficiency anemia due to chronic blood loss 02/29/2020  . Kidney cancer, primary, with metastasis from kidney to other site, left ([place]) 03/14/2020  . Lymphoma ([place])   . 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 Malignant neoplasm of kidney metastatic to lymph nodes of multiple regions ([place]) 03/14/2020  . Stroke [facility])     Past Surgical History:  Procedure Laterality Date  . APPENDECTOMY    . IR IMAGING GUIDED PORT INSERTION  03/20/2020  . IR US GUIDE BX ASP/DRAIN  03/08/2020  . PACEMAKER INSERTION    . PROSTATE SURGERY    . TONSILLECTOMY       Current Outpatient Medications  Medication Sig Dispense Refill  . alfuzosin (UROXATRAL) 10 MG 24 hr tablet Take 10 mg by mouth daily with breakfast.     . cabozantinib (CABOMETYX) 40 MG tablet Take 1 tablet (40 mg total) by mouth daily. Take on an empty stomach, 1 hour before or 2 hours after meals. 30 tablet 6  . Calcium-Magnesium-Vitamin D (CALCIUM 1200+D3 PO) Take 1 tablet by mouth daily.    . carvedilol (COREG) 25 MG tablet Take 25 mg by mouth 2 (two) times daily with a meal.     . cetirizine (ZYRTEC) 10 MG tablet Take 10 mg by mouth daily.    Marland Kitchen  Cholecalciferol (VITAMIN D-1000 MAX ST) 25 MCG (1000 UT) tablet Take 1 capsule by mouth daily.    . Cyanocobalamin 5000 MCG SUBL Place 5,000 mcg under the tongue daily. Place one dose under the tongue daily     . Dulaglutide (TRULICITY) 5.17 OH/6.0VP SOPN Inject 0.75 mg into the skin every Wednesday.     . escitalopram (LEXAPRO) 10 MG tablet Take 10 mg by mouth daily.     . fentaNYL (DURAGESIC) 25 MCG/HR Place 1 patch onto the skin  every 3 (three) days. 10 patch 0  . furosemide (LASIX) 40 MG tablet Take 1 tablet (40 mg total) by mouth daily. (Patient taking differently: Take 20 mg by mouth daily. ) 30 tablet 0  . gabapentin (NEURONTIN) 100 MG capsule Take 100 mg by mouth at bedtime as needed (pain).     Marland Kitchen glipiZIDE (GLUCOTROL) 10 MG tablet Take 10 mg by mouth daily before breakfast. Take with glipizide 5 mg to equal 15 mg    . glipiZIDE (GLUCOTROL) 5 MG tablet Take 5 mg by mouth daily before breakfast. Take with glipizide 10 mg to equal 15 mg    . Glucosamine-Chondroitin 250-200 MG TABS Take 1 tablet by mouth 2 (two) times daily.    Marland Kitchen HYDROcodone-acetaminophen (NORCO) 7.5-325 MG tablet Take 1 tablet by mouth every 6 (six) hours as needed for moderate pain. 60 tablet 0  . memantine (NAMENDA) 10 MG tablet Take 10 mg by mouth 2 (two) times daily.     . metFORMIN (GLUCOPHAGE) 1000 MG tablet Take 1,000 mg by mouth 2 (two) times daily with a meal.     . Multiple Vitamin (MULTIVITAMIN) tablet Take 1 tablet by mouth daily.    . Omega-3 Fatty Acids (FISH OIL PO) Take 2 capsules by mouth daily.    . rosuvastatin (CRESTOR) 20 MG tablet Take 20 mg by mouth daily.    Marland Kitchen spironolactone (ALDACTONE) 25 MG tablet Take 1 tablet (25 mg total) by mouth daily. 30 tablet 0  . sacubitril-valsartan (ENTRESTO) 49-51 MG Take 1 tablet by mouth 2 (two) times daily. 60 tablet 6  . selenium 50 MCG TABS Take 50 mcg by mouth daily.     No current facility-administered medications for this visit.    Allergies:   Patient has no known allergies.    Social History:  The patient  reports that he has quit smoking. He has never used smokeless tobacco. He reports current alcohol use. He reports that he does not use drugs.   Family History:  The patient's family history includes Breast cancer in his daughter; Cancer in his daughter and father; Cancer - Prostate in his father; Cataracts in his father, maternal grandfather, maternal grandmother, mother, paternal  grandfather, and paternal grandmother.    ROS:  Please see the history of present illness.   Otherwise, review of systems are positive for none.   All other systems are reviewed and negative.    PHYSICAL EXAM: VS:  BP (!) 160/78   Pulse 71   Ht 5\' 7"  (1.702 m)   Wt 176 lb 3.2 oz (79.9 kg)   SpO2 95%   BMI 27.60 kg/m  , BMI Body mass index is 27.6 kg/m. GEN: Well nourished, well developed, in no acute distress HEENT: sclera anicteric Neck: no JVD, carotid bruits, or masses Cardiac: RRR; no murmurs, rubs, or gallops, 1+ LE edema with compression stockings on Respiratory:  clear to auscultation bilaterally, normal work of breathing GI: soft, nontender, nondistended, + BS MS: no  deformity or atrophy Skin: warm and dry, no rash Neuro:  Strength and sensation are intact Psych: euthymic mood, full affect   EKG:  EKG is not ordered today.   Recent Labs: 03/16/2020: B Natriuretic Peptide 512.5 03/17/2020: Magnesium 2.0 03/22/2020: ALT 17; TSH 1.451 03/26/2020: BUN 26; Creatinine, Ser 1.10; Hemoglobin 9.6; Platelets 432; Potassium 5.5; Sodium 139    Lipid Panel No results found for: CHOL, TRIG, HDL, CHOLHDL, VLDL, LDLCALC, LDLDIRECT    Wt Readings from Last 3 Encounters:  03/26/20 176 lb 3.2 oz (79.9 kg)  03/17/20 189 lb 2.5 oz (85.8 kg)  03/14/20 190 lb (86.2 kg)      Other studies Reviewed: Additional studies/ records that were reviewed today include:   TTE 03/17/2020 1. Definity used; anteroseptal and apical akinesis with overall moderate  LV dysfunction; grade 1 diastolic dysfunction; mild LVH; mild AS (mean  gradient 11 mmHg); mild MR; moderate LAE.  2. Left ventricular ejection fraction, by estimation, is 35 to 40%. The  left ventricle has moderately decreased function. The left ventricle  demonstrates regional wall motion abnormalities (see scoring  diagram/findings for description). There is mild  left ventricular hypertrophy. Left ventricular diastolic parameters  are  consistent with Grade I diastolic dysfunction (impaired relaxation).  Elevated left atrial pressure.  3. Right ventricular systolic function is normal. The right ventricular  size is normal. There is moderately elevated pulmonary artery systolic  pressure.  4. Left atrial size was moderately dilated.  5. The mitral valve is normal in structure. Mild mitral valve  regurgitation. No evidence of mitral stenosis.  6. The aortic valve is tricuspid. Aortic valve regurgitation is not  visualized. Mild aortic valve stenosis.  7. The inferior vena cava is dilated in size with <50% respiratory  variability, suggesting right atrial pressure of 15 mmHg.     ASSESSMENT AND PLAN:  1. Chronic combined CHF: volume status has been stable. Weights continue to decrease. Still with some swelling on exam, though improved with compression stocking use.  - Continue carvedilol and spironolactone - Will transition  Losartan to entresto 49/51mg  BID - Will decrease lasix to 20mg  daily with K 43mEq daily  - Will check a BMET today for close monitoring of kidney function and electrolytes.   2. Recent NSTEMI: trop peaked at [place]. Likely demand ischemia in the setting of #1, though cannot exclude ischemic etiology. He is noted to have aortic atherosclerosis and coronary artery calcifications on a recent PET scan. He has had anemia requiring transfusions which limits ischemic evaluation as he would not tolerate DAPT if intervention was warranted. Thankfully he has no anginal complaints today. - Will check a CBC today to determine if Hgb is stable enough to reintroduce aspirin - Will follow-up with Dr. Audie Box to determine timing of an ischemic evaluation   - Continue statin  3. HTN: BP typically above goal at home with SBP in the 150s-170s  - Managed in the context of #1  4. CHB s/p PPM: device functioning properly on last interrogation. He has an appointment to see Dr. Rayann Heman 04/04/20 to establish  care - Continue routine monitoring per EP  5. Metastatic RCC: undergoing immunotherapy and po chemotherapy per Dr. Marin Olp.  - Continue management per oncology and primary team.   6. Anemia: Hgb 9.1 03/22/20. - Will repeat CBC today.    Current medicines are reviewed at length with the patient today.  The patient does not have concerns regarding medicines.  The following changes have been made:  As  above  Labs/ tests ordered today include:   Orders Placed This Encounter  Procedures  . Basic Metabolic Panel (BMET)  . CBC     Disposition:   FU with Dr. Audie Box in 1 month  Signed, Abigail Butts, PA-C  03/28/2020 11:25 PM

## 2020-03-26 NOTE — Patient Instructions (Signed)
Medication Instructions:  STOP- Losartan START- Entresto 49/51 mg by mouth twice a day DECREASE- Furosemide(Lasix) 20 mg by mouth daily DECREASE- Potassium 10 meq by mouth daily  *If you need a refill on your cardiac medications before your next appointment, please call your pharmacy*   Lab Work: CBC and BMP Today  If you have labs (blood work) drawn today and your tests are completely normal, you will receive your results only by: Marland Kitchen MyChart Message (if you have MyChart) OR . A paper copy in the mail If you have any lab test that is abnormal or we need to change your treatment, we will call you to review the results.   Testing/Procedures: None Ordered   Follow-Up: At [facility], you and your health needs are our priority.  As part of our continuing mission to provide you with exceptional heart care, we have created designated Provider Care Teams.  These Care Teams include your primary Cardiologist (physician) and Advanced Practice Providers (APPs -  Physician Assistants and Nurse Practitioners) who all work together to provide you with the care you need, when you need it.  We recommend signing up for the patient portal called "MyChart".  Sign up information is provided on this After Visit Summary.  MyChart is used to connect with patients for Virtual Visits (Telemedicine).  Patients are able to view lab/test results, encounter notes, upcoming appointments, etc.  Non-urgent messages can be sent to your provider as well.   To learn more about what you can do with MyChart, go to [email or web address].    Your next appointment:   Friday July 9th @ 10:00 am  The format for your next appointment:   In Person  Provider:   Eleonore Chiquito, MD   Other Instructions Your physician recommends that you schedule a follow-up appointment in: 2 weeks with Pharmacy for medications titration

## 2020-03-27 ENCOUNTER — Other Ambulatory Visit: Payer: Self-pay

## 2020-03-27 DIAGNOSIS — Z79899 Other long term (current) drug therapy: Secondary | ICD-10-CM

## 2020-03-27 LAB — BASIC METABOLIC PANEL
BUN/Creatinine Ratio: 24 (ref 10–24)
BUN: 26 mg/dL (ref 8–27)
CO2: 25 mmol/L (ref 20–29)
Calcium: 10.6 mg/dL — ABNORMAL HIGH (ref 8.6–10.2)
Chloride: 100 mmol/L (ref 96–106)
Creatinine, Ser: 1.1 mg/dL (ref 0.76–1.27)
GFR calc Af Amer: 74 mL/min/{1.73_m2} (ref >59)
GFR calc non Af Amer: 64 mL/min/{1.73_m2} (ref >59)
Glucose: 71 mg/dL (ref 65–99)
Potassium: 5.5 mmol/L — ABNORMAL HIGH (ref 3.5–5.2)
Sodium: 139 mmol/L (ref 134–144)

## 2020-03-27 LAB — CBC
Hematocrit: 31.1 % — ABNORMAL LOW (ref 37.5–51.0)
Hemoglobin: 9.6 g/dL — ABNORMAL LOW (ref 13.0–17.7)
MCH: 24.9 pg — ABNORMAL LOW (ref 26.6–33.0)
MCHC: 30.9 g/dL — ABNORMAL LOW (ref 31.5–35.7)
MCV: 81 fL (ref 79–97)
Platelets: 432 10*3/uL (ref 150–450)
RBC: 3.86 x10E6/uL — ABNORMAL LOW (ref 4.14–5.80)
RDW: 17.8 % — ABNORMAL HIGH (ref 11.6–15.4)
WBC: 7.3 10*3/uL (ref 3.4–10.8)

## 2020-03-27 NOTE — Progress Notes (Unsigned)
Placed order for BMET and discontinued Potassium supplement.

## 2020-03-28 ENCOUNTER — Telehealth: Payer: Self-pay | Admitting: *Deleted

## 2020-03-28 NOTE — Telephone Encounter (Signed)
Oral Oncology Patient Advocate Encounter  Received notification from [employer] Patient Assistance program that patient has been successfully enrolled into their program to receive Cabometyx from the manufacturer at $0 out of pocket until 10/19/20.    Carolen McDonald, Case Manager with [employer], sent an email and she had spoke with Kenneth Lyons to let her know of the approval.   Specialty Pharmacy that will dispense medication is [facility] by [employer] [facility].  Patient knows to call the office with questions or concerns.   Oral Oncology Clinic will continue to follow.  [place] Patient [place] Phone [phone] Fax [phone] 03/28/2020 12:58 PM

## 2020-03-28 NOTE — Telephone Encounter (Signed)
Call received from [employer] to notify office that tissue block that was received does not have a sufficient supply for testing and would like to know if second block is available for testing.  Call placed to [facility] pathology and they state that a second block is not available for testing.  Call placed back to [employer] to notify them that second block is not available for testing. Dr. Marin Olp notified.

## 2020-03-29 ENCOUNTER — Telehealth: Payer: Self-pay | Admitting: *Deleted

## 2020-03-29 DIAGNOSIS — Z79899 Other long term (current) drug therapy: Secondary | ICD-10-CM | POA: Diagnosis not present

## 2020-03-29 NOTE — Telephone Encounter (Signed)
Pt here for lab work and wanted to let someone know has had weight loss of 5 LB in 1 day Reviewed last office note and appears Furosemide was decrease and per pt has appt with PCP encouraged to  Keep that appt to discuss weight loss Verbalized understanding./cy

## 2020-03-30 ENCOUNTER — Telehealth: Payer: Self-pay | Admitting: Nutrition

## 2020-03-30 ENCOUNTER — Telehealth: Payer: Self-pay | Admitting: Cardiovascular Disease

## 2020-03-30 DIAGNOSIS — Z79899 Other long term (current) drug therapy: Secondary | ICD-10-CM

## 2020-03-30 LAB — BASIC METABOLIC PANEL
BUN/Creatinine Ratio: 24 (ref 10–24)
BUN: 28 mg/dL — ABNORMAL HIGH (ref 8–27)
CO2: 24 mmol/L (ref 20–29)
Calcium: 10.7 mg/dL — ABNORMAL HIGH (ref 8.6–10.2)
Chloride: 98 mmol/L (ref 96–106)
Creatinine, Ser: 1.15 mg/dL (ref 0.76–1.27)
GFR calc Af Amer: 71 mL/min/{1.73_m2} (ref >59)
GFR calc non Af Amer: 61 mL/min/{1.73_m2} (ref >59)
Glucose: 146 mg/dL — ABNORMAL HIGH (ref 65–99)
Potassium: 5.3 mmol/L — ABNORMAL HIGH (ref 3.5–5.2)
Sodium: 140 mmol/L (ref 134–144)

## 2020-03-30 MED ORDER — SPIRONOLACTONE 25 MG PO TABS
12.5000 mg | ORAL_TABLET | Freq: Every day | ORAL | 3 refills | Status: DC
Start: 2020-03-30 — End: 2020-04-07

## 2020-03-30 NOTE — Telephone Encounter (Signed)
Spoke with the patient's wife and notified her of lab results.  Abigail Butts, PA-C  03/30/2020 10:59 AM EDT Back to Top    Please notify the patient that his potassium level is still mildly elevated. Please ensure he is no longer on potassium. If he has been off potassium, please ask him to decrease his spironolactone to 12.5mg  daily (1/2 tab). He should get a repeat BMET in 1 week and he has f/u scheduled with pharmacy 6/22.     The patient has stopped taking potassium. He will decrease spironolactone to 12.5 mg daily and repeat BMET next week.

## 2020-03-30 NOTE — Telephone Encounter (Signed)
Spoke with jim, medication changes based on last office visit with Daleen Snook kroeger pa given. Follow up appointments given as well.

## 2020-03-30 NOTE — Telephone Encounter (Signed)
Patient's wife is calling for his lab results.

## 2020-03-30 NOTE — Telephone Encounter (Signed)
79 year old male with metastatic renal cell cancer receiving immunotherapy and oral chemotherapy followed by Dr. Marin Olp.  Past medical history includes stroke, CHF, hypertension, hypercholesterolemia, GERD, diabetes, anemia, and Alzheimer's disease.  Medications include vitamin D, vitamin B12, Lasix, Glucotrol, Glucophage, multivitamin, omega-3 fatty acids, and Aldactone.  Labs include: Glucose 146, BUN 28, and potassium 5.3 on June 10.  Height: 5 feet 7 inches. Weight: 176.2 pounds on June 7. Usual body weight: 180-190 pounds.  I spoke with patient's wife over the telephone.  She reports patient's edema has improved and he was able to remove compression stockings yesterday and did not see increasing edema after. She reports patient's appetite is improving.  She is monitoring his sodium and trying to feed him higher protein foods. Noted Lasix was decreased.  Potassium supplement was discontinued. Patient's wife reports weight on home scale this morning was 163.2 pounds.  Fasting blood sugar of 81. Reports patient is eating yogurt, Premier protein shakes, Kind bars, chicken, Brussels sprouts, potatoes, and spinach salad.  Denies difficulty chewing or swallowing, no nausea or vomiting. Reports no bowel movement yesterday or today.  She says she will start MiraLAX. Patient is working with physical therapy, Occupational Therapy and speech therapy.  Nutrition diagnosis:  Unintended weight loss related to cancer and CHF/edema as evidenced by 13 pound weight loss over 1 week.  Intervention: Recommended higher calorie nutrition shake if substituting at mealtimes.  Examples given. Continue decreased sodium foods. Encourage smaller more frequent meals and snacks. I will mail fact sheets to patient's home.  Will include contact information for further questions or concerns.  Monitoring, evaluation, goals: Patient will tolerate adequate calories and protein to maintain lean body mass.  No  follow-up is scheduled patient's wife will contact me for questions or concerns.  **Disclaimer: This note was dictated with voice recognition software. Similar sounding words can inadvertently be transcribed and this note may contain transcription errors which may not have been corrected upon publication of note.**

## 2020-03-30 NOTE — Telephone Encounter (Signed)
New Message:  Confirming medication changes for Lasix and Potassium.  He also wanted you to know pt have had a significant weight loss since 03-20-20.

## 2020-04-02 DIAGNOSIS — C642 Malignant neoplasm of left kidney, except renal pelvis: Secondary | ICD-10-CM | POA: Diagnosis not present

## 2020-04-02 DIAGNOSIS — I442 Atrioventricular block, complete: Secondary | ICD-10-CM | POA: Diagnosis not present

## 2020-04-02 DIAGNOSIS — I110 Hypertensive heart disease with heart failure: Secondary | ICD-10-CM | POA: Diagnosis not present

## 2020-04-02 DIAGNOSIS — I5042 Chronic combined systolic (congestive) and diastolic (congestive) heart failure: Secondary | ICD-10-CM | POA: Diagnosis not present

## 2020-04-04 ENCOUNTER — Telehealth (INDEPENDENT_AMBULATORY_CARE_PROVIDER_SITE_OTHER): Payer: HMO | Admitting: Internal Medicine

## 2020-04-04 ENCOUNTER — Encounter: Payer: Self-pay | Admitting: Internal Medicine

## 2020-04-04 ENCOUNTER — Ambulatory Visit: Payer: Self-pay

## 2020-04-04 ENCOUNTER — Other Ambulatory Visit: Payer: Self-pay

## 2020-04-04 VITALS — BP 138/59 | HR 70 | Ht 67.0 in | Wt 160.7 lb

## 2020-04-04 DIAGNOSIS — I442 Atrioventricular block, complete: Secondary | ICD-10-CM

## 2020-04-04 DIAGNOSIS — I519 Heart disease, unspecified: Secondary | ICD-10-CM

## 2020-04-04 NOTE — Progress Notes (Signed)
Electrophysiology TeleHealth Note   Due to national recommendations of social distancing due to [place] 19, Audio/video telehealth visit is felt to be most appropriate for this patient at this time.  See MyChart message from today for patient consent regarding telehealth for [facility].   Date:  04/04/2020   ID:  Kenneth Lyons, DOB Feb 23, 1941, MRN [id number]  Location: home Provider location: [place] Evaluation Performed: New patient consult  PCP:  Kenneth Girt, DO  Cardiologist:  Kenneth Field, MD  Electrophysiologist:  None   Chief Complaint:  pacemaker  History of Present Illness:    Kenneth Lyons is a 79 y.o. male who presents via audio/video conferencing for a telehealth visit today.   The patient is referred for new consultation regarding heart block by Dr Kenneth Lyons. The patient has multiple chronic comoribidities including dementia, prior stroke, and CHF. He underwent PPM implant (SJM) by Dr Kenneth Lyons in 2013.  He has done reasonably well since that time.  He has had some L arm swelling suggesting likely venous occlusion chronically.   He has recenlty been admitted with CHF.  His EF is 35-40%.  He has done well with medical therapy. He has been diagnosed with RCC with metastatic disease and has recently begun therapy.    Today, he denies symptoms of palpitations, chest pain, shortness of breath, orthopnea, PND, lower extremity edema, claudication, dizziness, presyncope, syncope, bleeding, or neurologic sequela. The patient is tolerating medications without difficulties and is otherwise without complaint today.     Past Medical History:  Diagnosis Date  . Alzheimer's dementia ([place])   . Anemia associated with left renal cell cancer treated with erythropoietin ([place]) 03/14/2020  . Cancer ([place])   . Diabetes mellitus   . GERD (gastroesophageal reflux disease)   . Goals of care, counseling/discussion 02/28/2020  . High cholesterol   . Hypercalcemia of  malignancy 03/14/2020  . Hypertension   . Iron deficiency anemia due to chronic blood loss 02/29/2020  . Kidney cancer, primary, with metastasis from kidney to other site, left ([place]) 03/14/2020  . Lymphoma ([place])   . 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 Malignant neoplasm of kidney metastatic to lymph nodes of multiple regions ([place]) 03/14/2020  . Stroke [facility])     Past Surgical History:  Procedure Laterality Date  . APPENDECTOMY    . IR IMAGING GUIDED PORT INSERTION  03/20/2020  . IR US GUIDE BX ASP/DRAIN  03/08/2020  . PACEMAKER INSERTION Left 08/31/2012   SJM model Accent RF PM2210 (SN [id number]) dual chamber pacemaker implanted by Dr Kenneth Lyons for complete heart block  . PROSTATE SURGERY    . TONSILLECTOMY      Current Outpatient Medications  Medication Sig Dispense Refill  . alfuzosin (UROXATRAL) 10 MG 24 hr tablet Take 10 mg by mouth daily with breakfast.     . cabozantinib (CABOMETYX) 40 MG tablet Take 1 tablet (40 mg total) by mouth daily. Take on an empty stomach, 1 hour before or 2 hours after meals. 30 tablet 6  . Calcium-Magnesium-Vitamin D (CALCIUM 1200+D3 PO) Take 1 tablet by mouth daily.    . carvedilol (COREG) 25 MG tablet Take 25 mg by mouth 2 (two) times daily with a meal.     . cetirizine (ZYRTEC) 10 MG tablet Take 10 mg by mouth daily.    . Cholecalciferol (VITAMIN D-1000 MAX ST) 25 MCG (1000 UT) tablet Take 1 capsule by mouth daily.    . Cyanocobalamin 5000 MCG  SUBL Place 5,000 mcg under the tongue daily. Place one dose under the tongue daily     . Dulaglutide (TRULICITY) 3.84 TX/6.4WO SOPN Inject 0.75 mg into the skin every Wednesday.     . escitalopram (LEXAPRO) 10 MG tablet Take 10 mg by mouth daily.     . fentaNYL (DURAGESIC) 25 MCG/HR Place 1 patch onto the skin every 3 (three) days. 10 patch 0  . furosemide (LASIX) 40 MG tablet Take 1 tablet (40 mg total) by mouth daily. (Patient taking differently: Take 20 mg by mouth daily. ) 30 tablet 0  . gabapentin  (NEURONTIN) 100 MG capsule Take 100 mg by mouth at bedtime as needed (pain).     Marland Kitchen glipiZIDE (GLUCOTROL) 10 MG tablet Take 10 mg by mouth daily before breakfast. Take with glipizide 5 mg to equal 15 mg    . glipiZIDE (GLUCOTROL) 5 MG tablet Take 5 mg by mouth daily before breakfast. Take with glipizide 10 mg to equal 15 mg    . Glucosamine-Chondroitin 250-200 MG TABS Take 1 tablet by mouth 2 (two) times daily.    Marland Kitchen HYDROcodone-acetaminophen (NORCO) 7.5-325 MG tablet Take 1 tablet by mouth every 6 (six) hours as needed for moderate pain. 60 tablet 0  . memantine (NAMENDA) 10 MG tablet Take 10 mg by mouth 2 (two) times daily.     . metFORMIN (GLUCOPHAGE) 1000 MG tablet Take 1,000 mg by mouth 2 (two) times daily with a meal.     . Multiple Vitamin (MULTIVITAMIN) tablet Take 1 tablet by mouth daily.    . Omega-3 Fatty Acids (FISH OIL PO) Take 2 capsules by mouth daily.    . rosuvastatin (CRESTOR) 20 MG tablet Take 20 mg by mouth daily.    . sacubitril-valsartan (ENTRESTO) 49-51 MG Take 1 tablet by mouth 2 (two) times daily. 60 tablet 6  . selenium 50 MCG TABS Take 50 mcg by mouth daily.    Marland Kitchen spironolactone (ALDACTONE) 25 MG tablet Take 0.5 tablets (12.5 mg total) by mouth daily. 45 tablet 3   No current facility-administered medications for this visit.    Allergies:   Patient has no known allergies.   Social History:  The patient  reports that he has quit smoking. He has never used smokeless tobacco. He reports current alcohol use. He reports that he does not use drugs.   Family History:  The patient's family history includes Breast cancer in his daughter; Cancer in his daughter and father; Cancer - Prostate in his father; Cataracts in his father, maternal grandfather, maternal grandmother, mother, paternal grandfather, and paternal grandmother.    ROS:  Please see the history of present illness.   All other systems are personally reviewed and negative.    Exam:    Vital Signs:  BP (!)  138/59   Pulse 70   Ht 5\' 7"  (1.702 m)   Wt 160 lb 11.2 oz (72.9 kg)   BMI 25.17 kg/m   Chronically ill, some dysarthria. regular work of breathing,  good skin color Eyes- anicteric, neuro- grossly intact, skin- no apparent rash or lesions or cyanosis, mouth- oral mucosa is pink   Labs/Other Tests and Data Reviewed:    Recent Labs: 03/16/2020: B Natriuretic Peptide 512.5 03/17/2020: Magnesium 2.0 03/22/2020: ALT 17; TSH 1.451 03/26/2020: Hemoglobin 9.6; Platelets 432 03/29/2020: BUN 28; Creatinine, Ser 1.15; Potassium 5.3; Sodium 140   Wt Readings from Last 3 Encounters:  04/04/20 160 lb 11.2 oz (72.9 kg)  03/26/20 176 lb 3.2 oz (  79.9 kg)  03/17/20 189 lb 2.5 oz (85.8 kg)     Other studies personally reviewed: Additional studies/ records that were reviewed today include: Dr Francene Castle prior notes,  Cardiology notes, echo, ekg  Review of the above records today demonstrates: as above   ASSESSMENT & PLAN:    1.  Complete heart block S/p PPM by Dr Kenneth Lyons Now presents to establish care in our office. I will request remotes come to me.  He has CHF but is not a candidate for device upgrade due to: Ongoing RCC with metastasis therapy, dementia, presumed L subclavian occlusion due to prior arm swelling.  We discussed at length today.  Patient and wife are clear that they would prefer a conservative approach.  Follow-up:  12 months with me  Patient Risk:  after full review of this patients clinical status, I feel that they are at moderate risk at this time.   Today, I have spent 20 minutes with the patient with telehealth technology discussing CHB .    Signed, Thompson Grayer MD, [place] 04/04/2020 11:40 AM   [facility] [address] [place] [address] [phone] (office) [phone] (fax)

## 2020-04-05 DIAGNOSIS — I442 Atrioventricular block, complete: Secondary | ICD-10-CM | POA: Diagnosis not present

## 2020-04-05 DIAGNOSIS — I110 Hypertensive heart disease with heart failure: Secondary | ICD-10-CM | POA: Diagnosis not present

## 2020-04-05 DIAGNOSIS — D630 Anemia in neoplastic disease: Secondary | ICD-10-CM | POA: Diagnosis not present

## 2020-04-05 DIAGNOSIS — I0981 Rheumatic heart failure: Secondary | ICD-10-CM | POA: Diagnosis not present

## 2020-04-05 DIAGNOSIS — C7989 Secondary malignant neoplasm of other specified sites: Secondary | ICD-10-CM | POA: Diagnosis not present

## 2020-04-05 DIAGNOSIS — G301 Alzheimer's disease with late onset: Secondary | ICD-10-CM | POA: Diagnosis not present

## 2020-04-05 DIAGNOSIS — I083 Combined rheumatic disorders of mitral, aortic and tricuspid valves: Secondary | ICD-10-CM | POA: Diagnosis not present

## 2020-04-05 DIAGNOSIS — G893 Neoplasm related pain (acute) (chronic): Secondary | ICD-10-CM | POA: Diagnosis not present

## 2020-04-05 DIAGNOSIS — K219 Gastro-esophageal reflux disease without esophagitis: Secondary | ICD-10-CM | POA: Diagnosis not present

## 2020-04-05 DIAGNOSIS — E7800 Pure hypercholesterolemia, unspecified: Secondary | ICD-10-CM | POA: Diagnosis not present

## 2020-04-05 DIAGNOSIS — F0280 Dementia in other diseases classified elsewhere without behavioral disturbance: Secondary | ICD-10-CM | POA: Diagnosis not present

## 2020-04-05 DIAGNOSIS — Z79899 Other long term (current) drug therapy: Secondary | ICD-10-CM | POA: Diagnosis not present

## 2020-04-05 DIAGNOSIS — G894 Chronic pain syndrome: Secondary | ICD-10-CM | POA: Diagnosis not present

## 2020-04-05 DIAGNOSIS — E782 Mixed hyperlipidemia: Secondary | ICD-10-CM | POA: Diagnosis not present

## 2020-04-05 DIAGNOSIS — Z7984 Long term (current) use of oral hypoglycemic drugs: Secondary | ICD-10-CM | POA: Diagnosis not present

## 2020-04-05 DIAGNOSIS — D500 Iron deficiency anemia secondary to blood loss (chronic): Secondary | ICD-10-CM | POA: Diagnosis not present

## 2020-04-05 DIAGNOSIS — R918 Other nonspecific abnormal finding of lung field: Secondary | ICD-10-CM | POA: Diagnosis not present

## 2020-04-05 DIAGNOSIS — E1169 Type 2 diabetes mellitus with other specified complication: Secondary | ICD-10-CM | POA: Diagnosis not present

## 2020-04-05 DIAGNOSIS — I214 Non-ST elevation (NSTEMI) myocardial infarction: Secondary | ICD-10-CM | POA: Diagnosis not present

## 2020-04-05 DIAGNOSIS — I5033 Acute on chronic diastolic (congestive) heart failure: Secondary | ICD-10-CM | POA: Diagnosis not present

## 2020-04-05 DIAGNOSIS — I700 Atherosclerosis of aorta: Secondary | ICD-10-CM | POA: Diagnosis not present

## 2020-04-05 DIAGNOSIS — K760 Fatty (change of) liver, not elsewhere classified: Secondary | ICD-10-CM | POA: Diagnosis not present

## 2020-04-05 DIAGNOSIS — N200 Calculus of kidney: Secondary | ICD-10-CM | POA: Diagnosis not present

## 2020-04-05 DIAGNOSIS — I5020 Unspecified systolic (congestive) heart failure: Secondary | ICD-10-CM | POA: Diagnosis not present

## 2020-04-05 DIAGNOSIS — C642 Malignant neoplasm of left kidney, except renal pelvis: Secondary | ICD-10-CM | POA: Diagnosis not present

## 2020-04-06 ENCOUNTER — Other Ambulatory Visit: Payer: Self-pay

## 2020-04-06 DIAGNOSIS — Z79899 Other long term (current) drug therapy: Secondary | ICD-10-CM

## 2020-04-07 ENCOUNTER — Telehealth: Payer: Self-pay | Admitting: Student

## 2020-04-07 DIAGNOSIS — E875 Hyperkalemia: Secondary | ICD-10-CM

## 2020-04-07 LAB — BASIC METABOLIC PANEL
BUN/Creatinine Ratio: 25 — ABNORMAL HIGH (ref 10–24)
BUN: 28 mg/dL — ABNORMAL HIGH (ref 8–27)
CO2: 26 mmol/L (ref 20–29)
Calcium: 10.1 mg/dL (ref 8.6–10.2)
Chloride: 102 mmol/L (ref 96–106)
Creatinine, Ser: 1.11 mg/dL (ref 0.76–1.27)
GFR calc Af Amer: 74 mL/min/{1.73_m2} (ref >59)
GFR calc non Af Amer: 64 mL/min/{1.73_m2} (ref >59)
Glucose: 136 mg/dL — ABNORMAL HIGH (ref 65–99)
Potassium: 5.7 mmol/L — ABNORMAL HIGH (ref 3.5–5.2)
Sodium: 142 mmol/L (ref 134–144)

## 2020-04-07 MED ORDER — SODIUM POLYSTYRENE SULFONATE 15 GM/60ML PO SUSP: 15.0000 g | Freq: Once | ORAL | 0 refills | Status: AC

## 2020-04-07 NOTE — Telephone Encounter (Signed)
   Covering in-basket for [employer], PA-C, while she is out of the office. Patient has had hyperkalemia and medications have been adjusted. Potassium continues to be elevated at 5.7. Will stop Spironolactone and give one dose of Kayexalate 15mg  (8mLs). Patient has visit with pharmacy on Tuesday 04/10/2020 so will recheck BMET at that time.  Called and discussed above plan with patient and wife who voiced understanding. Also discussed importance of limiting foods high in potassium and reviewed some of these foods (patient likes bananas and orange juice so discussed importance of limiting these for now).   Called pharmacy and and they do have Kayexalate. I will prescribe 120 mLs of Kayexalate in case patient needs an additional dose but patient knows to only take 15mg  (6mLs). Pharmacist said she would re-emphasize this as well.   Darreld Mclean, PA-C 04/07/2020 9:38 AM

## 2020-04-09 NOTE — Telephone Encounter (Signed)
Received notification from EASE program that patient medication was shipped on 04/03/20.  [place] Patient [place] Phone [phone] Fax [phone] 04/09/2020 1:35 PM

## 2020-04-10 ENCOUNTER — Ambulatory Visit (INDEPENDENT_AMBULATORY_CARE_PROVIDER_SITE_OTHER): Payer: HMO | Admitting: Pharmacist

## 2020-04-10 ENCOUNTER — Other Ambulatory Visit: Payer: Self-pay | Admitting: Physician Assistant

## 2020-04-10 ENCOUNTER — Other Ambulatory Visit: Payer: Self-pay

## 2020-04-10 VITALS — BP 110/50 | HR 70 | Wt 165.0 lb

## 2020-04-10 DIAGNOSIS — E875 Hyperkalemia: Secondary | ICD-10-CM

## 2020-04-10 DIAGNOSIS — I5033 Acute on chronic diastolic (congestive) heart failure: Secondary | ICD-10-CM

## 2020-04-10 DIAGNOSIS — I10 Essential (primary) hypertension: Secondary | ICD-10-CM | POA: Diagnosis not present

## 2020-04-10 LAB — BASIC METABOLIC PANEL
BUN/Creatinine Ratio: 30 — ABNORMAL HIGH (ref 10–24)
BUN: 32 mg/dL — ABNORMAL HIGH (ref 8–27)
CO2: 25 mmol/L (ref 20–29)
Calcium: 9.7 mg/dL (ref 8.6–10.2)
Chloride: 103 mmol/L (ref 96–106)
Creatinine, Ser: 1.06 mg/dL (ref 0.76–1.27)
GFR calc Af Amer: 78 mL/min/{1.73_m2} (ref >59)
GFR calc non Af Amer: 67 mL/min/{1.73_m2} (ref >59)
Glucose: 127 mg/dL — ABNORMAL HIGH (ref 65–99)
Potassium: 4.7 mmol/L (ref 3.5–5.2)
Sodium: 139 mmol/L (ref 134–144)

## 2020-04-10 MED ORDER — ENTRESTO 49-51 MG PO TABS: 1.0000 | ORAL_TABLET | Freq: Two times a day (BID) | ORAL | 0 refills | Status: DC

## 2020-04-10 NOTE — Progress Notes (Signed)
Pt wife called stating they were completely out of entresto. They saw our clinical pharmacist today who recommended checking a K prior to adjusting dosage or continuing medication. BMP reviewed, K 4.7. She did not want to pick up a full prescription for entresto in case the dose will be changed tomorrow. I called in three tablets for her. She will call the office in the morning for further instruction.

## 2020-04-10 NOTE — Patient Instructions (Addendum)
It was a pleasure to meet you!  I will call you tomorrow with your lab results.  Please continue Entresto 49-51mg  BID,  Carvedilol 25mg  BID and furosemide 20mg  daily for now  Continue a low sodium, low K diet  Continue checking blood pressure and weight at home Wear compression stockings as needed for swelling.  Call us at [phone] with any questions or concerns

## 2020-04-10 NOTE — Progress Notes (Signed)
Patient ID: Kenneth Lyons                 DOB: May 30, 1941                      MRN: [id number]     HPI: Kenneth Lyons is a 79 y.o. male patient of Dr. Audie Box referred by Roby Lofts, PA  to HTN clinic. PMH is significant for dementia, prior stroke, CHF  hyperkalemia, RCC with metastatic disease and heart block. Most recent LVEF EF 35-40% on echo. Patient was in hospital from 03/16/20-03/20/20 for acute respiratory failure after receiving 2 uPRBC transfusion at an outpatient infusion center. He was found to have elevated troponins (peaked at 1650s) and echo revealed EF 35-40%, G1DD, and anteroseptal/apical akinesis. Unfortunately given ongoing anemia associated with his recent cancer diagnoses management was limited. He was diuresed wit IV lasix and started on carvedilol, losartan, po lasix, and spironolactone at discharge with plans to undergo an ischemic evaluation outpatient once anemia stabilized.   Today he presents to pharmacy clinic, accompanied by his wife, for further medication titration. At last visit with Daleen Snook weight was stable. Losartan was changed to Entresto, lasix was decreased to 20mg  daly. KCL was stopped due to K of 5.5. Upon recheck K was 5.3. Spironolactone was decreased to 12.5mg  daily. But on recheck K was 5.7. Therefore, spironolactone was stopped and a dose of kayexaltae was given on 6/19.  Symptomatically, he is feeling good, denies dizziness, lightheadedness, and fatigue. Denies chest pain or palpitations. Feels SOB when he walks to far too long. Able to complete all ADLs. He does checks his weight at home, has been stable. Swelling is much improved. Very limited to exam today. Wore compression stocking when he had swelling, not wearing anymore. No PND, or orthopnea. Sleeps in hospital bed with slight incline on 1 pillow. Appetite has been good. He adheres to a low-salt diet. Will get a BMP today to check K. Cost of Entresto is $90/90 day supple. This is affordable per  wife.   Current CHF meds: Entresto 49-51mg  BID,  Carvedilol 25mg  BID, furosemide 20mg  daily Previously tried: spironolactone (hyperkalemia) BP goal: <130/80  Family History:  The patient's family history includes Breast cancer in his daughter; Cancer in his daughter and father; Cancer - Prostate in his father; Cataracts in his father, maternal grandfather, maternal grandmother, mother, paternal grandfather, and paternal grandmother.   Social History: The patient  reports that he has quit smoking. He has never used smokeless tobacco. He reports current alcohol use. He reports that he does not use drugs.   Diet: trying to restrict salt, and now K  Exercise:   Home BP readings: 142/75,138/59, 103/58, 108/53, 104/58, 136/64 HR 70-80 PM 166/86, 165/80, 162/69, 156/82, 173/92, 159/82, 135/72 HR 75-83  Wt Readings from Last 3 Encounters:  04/04/20 160 lb 11.2 oz (72.9 kg)  03/26/20 176 lb 3.2 oz (79.9 kg)  03/17/20 189 lb 2.5 oz (85.8 kg)   BP Readings from Last 3 Encounters:  04/04/20 (!) 138/59  03/26/20 (!) 160/78  03/22/20 (!) 138/54   Pulse Readings from Last 3 Encounters:  04/04/20 70  03/26/20 71  03/22/20 70    Renal function: Estimated Creatinine Clearance: 52.1 mL/min (by C-G formula based on SCr of 1.11 mg/dL).  Past Medical History:  Diagnosis Date  . Alzheimer's dementia ([place])   . Anemia associated with left renal cell cancer treated with erythropoietin ([place]) 03/14/2020  . Cancer ([place])   .  Diabetes mellitus   . GERD (gastroesophageal reflux disease)   . Goals of care, counseling/discussion 02/28/2020  . High cholesterol   . Hypercalcemia of malignancy 03/14/2020  . Hypertension   . Iron deficiency anemia due to chronic blood loss 02/29/2020  . Kidney cancer, primary, with metastasis from kidney to other site, left ([place]) 03/14/2020  . Lymphoma ([place])   . 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 Malignant neoplasm of kidney metastatic to lymph nodes of  multiple regions ([place]) 03/14/2020  . Stroke [facility])     Current Outpatient Medications on File Prior to Visit  Medication Sig Dispense Refill  . alfuzosin (UROXATRAL) 10 MG 24 hr tablet Take 10 mg by mouth daily with breakfast.     . cabozantinib (CABOMETYX) 40 MG tablet Take 1 tablet (40 mg total) by mouth daily. Take on an empty stomach, 1 hour before or 2 hours after meals. 30 tablet 6  . Calcium-Magnesium-Vitamin D (CALCIUM 1200+D3 PO) Take 1 tablet by mouth daily.    . carvedilol (COREG) 25 MG tablet Take 25 mg by mouth 2 (two) times daily with a meal.     . cetirizine (ZYRTEC) 10 MG tablet Take 10 mg by mouth daily.    . Cholecalciferol (VITAMIN D-1000 MAX ST) 25 MCG (1000 UT) tablet Take 1 capsule by mouth daily.    . Cyanocobalamin 5000 MCG SUBL Place 5,000 mcg under the tongue daily. Place one dose under the tongue daily     . Dulaglutide (TRULICITY) 2.20 UR/4.2HC SOPN Inject 0.75 mg into the skin every Wednesday.     . escitalopram (LEXAPRO) 10 MG tablet Take 10 mg by mouth daily.     . fentaNYL (DURAGESIC) 25 MCG/HR Place 1 patch onto the skin every 3 (three) days. 10 patch 0  . furosemide (LASIX) 40 MG tablet Take 1 tablet (40 mg total) by mouth daily. (Patient taking differently: Take 20 mg by mouth daily. ) 30 tablet 0  . gabapentin (NEURONTIN) 100 MG capsule Take 100 mg by mouth at bedtime as needed (pain).     Marland Kitchen glipiZIDE (GLUCOTROL) 10 MG tablet Take 10 mg by mouth daily before breakfast. Take with glipizide 5 mg to equal 15 mg    . glipiZIDE (GLUCOTROL) 5 MG tablet Take 5 mg by mouth daily before breakfast. Take with glipizide 10 mg to equal 15 mg    . Glucosamine-Chondroitin 250-200 MG TABS Take 1 tablet by mouth 2 (two) times daily.    Marland Kitchen HYDROcodone-acetaminophen (NORCO) 7.5-325 MG tablet Take 1 tablet by mouth every 6 (six) hours as needed for moderate pain. 60 tablet 0  . memantine (NAMENDA) 10 MG tablet Take 10 mg by mouth 2 (two) times daily.     . metFORMIN (GLUCOPHAGE)  1000 MG tablet Take 1,000 mg by mouth 2 (two) times daily with a meal.     . Multiple Vitamin (MULTIVITAMIN) tablet Take 1 tablet by mouth daily.    . Omega-3 Fatty Acids (FISH OIL PO) Take 2 capsules by mouth daily.    . rosuvastatin (CRESTOR) 20 MG tablet Take 20 mg by mouth daily.    . sacubitril-valsartan (ENTRESTO) 49-51 MG Take 1 tablet by mouth 2 (two) times daily. 60 tablet 6  . selenium 50 MCG TABS Take 50 mcg by mouth daily.    . [DISCONTINUED] prochlorperazine (COMPAZINE) 10 MG tablet Take 1 tablet (10 mg total) by mouth every 6 (six) hours as needed (Nausea or vomiting). 30 tablet 1  No current facility-administered medications on file prior to visit.    No Known Allergies   Assessment/Plan:  1. CHF - Will need to recheck K today to see if patient can remain on Entresto. Blood pressure good, but on the lower side in clinic today. Wife provides BP that are high in the evening but in low 100's in the morning. Would not titrate Entresto at this point. Continue medications as is and I will call wife with BMP results and discuss plan. Continue low K, low Na diet and wearing compressions stockings as need for swelling.   Ramond Dial, Pharm.D, BCPS, CPP [place]  [address] [place], [place] [address]  Phone: [phone]; Fax: [phone]

## 2020-04-11 ENCOUNTER — Inpatient Hospital Stay (HOSPITAL_BASED_OUTPATIENT_CLINIC_OR_DEPARTMENT_OTHER): Payer: HMO | Admitting: Hematology & Oncology

## 2020-04-11 ENCOUNTER — Encounter: Payer: Self-pay | Admitting: Hematology & Oncology

## 2020-04-11 ENCOUNTER — Telehealth: Payer: Self-pay

## 2020-04-11 ENCOUNTER — Telehealth: Payer: Self-pay | Admitting: Pharmacist

## 2020-04-11 ENCOUNTER — Inpatient Hospital Stay: Payer: HMO

## 2020-04-11 ENCOUNTER — Encounter: Payer: Self-pay | Admitting: *Deleted

## 2020-04-11 ENCOUNTER — Other Ambulatory Visit: Payer: Self-pay

## 2020-04-11 VITALS — BP 122/66 | HR 71 | Temp 96.9°F | Resp 19 | Wt 163.0 lb

## 2020-04-11 VITALS — BP 126/60 | HR 75 | Temp 97.6°F | Resp 18

## 2020-04-11 DIAGNOSIS — D500 Iron deficiency anemia secondary to blood loss (chronic): Secondary | ICD-10-CM

## 2020-04-11 DIAGNOSIS — D630 Anemia in neoplastic disease: Secondary | ICD-10-CM

## 2020-04-11 DIAGNOSIS — C642 Malignant neoplasm of left kidney, except renal pelvis: Secondary | ICD-10-CM

## 2020-04-11 DIAGNOSIS — I5033 Acute on chronic diastolic (congestive) heart failure: Secondary | ICD-10-CM

## 2020-04-11 LAB — CBC WITH DIFFERENTIAL (CANCER CENTER ONLY)
Abs Immature Granulocytes: 0.02 10*3/uL (ref 0.00–0.07)
Basophils Absolute: 0.1 10*3/uL (ref 0.0–0.1)
Basophils Relative: 1 %
Eosinophils Absolute: 0.5 10*3/uL (ref 0.0–0.5)
Eosinophils Relative: 7 %
HCT: 39.9 % (ref 39.0–52.0)
Hemoglobin: 12.3 g/dL — ABNORMAL LOW (ref 13.0–17.0)
Immature Granulocytes: 0 %
Lymphocytes Relative: 12 %
Lymphs Abs: 0.8 10*3/uL (ref 0.7–4.0)
MCH: 24.9 pg — ABNORMAL LOW (ref 26.0–34.0)
MCHC: 30.8 g/dL (ref 30.0–36.0)
MCV: 80.8 fL (ref 80.0–100.0)
Monocytes Absolute: 0.4 10*3/uL (ref 0.1–1.0)
Monocytes Relative: 5 %
Neutro Abs: 5 10*3/uL (ref 1.7–7.7)
Neutrophils Relative %: 75 %
Platelet Count: 241 10*3/uL (ref 150–400)
RBC: 4.94 MIL/uL (ref 4.22–5.81)
RDW: 19.8 % — ABNORMAL HIGH (ref 11.5–15.5)
WBC Count: 6.7 10*3/uL (ref 4.0–10.5)
nRBC: 0 % (ref 0.0–0.2)

## 2020-04-11 LAB — CMP (CANCER CENTER ONLY)
ALT: 35 U/L (ref 0–44)
AST: 43 U/L — ABNORMAL HIGH (ref 15–41)
Albumin: 3.9 g/dL (ref 3.5–5.0)
Alkaline Phosphatase: 58 U/L (ref 38–126)
Anion gap: 8 (ref 5–15)
BUN: 30 mg/dL — ABNORMAL HIGH (ref 8–23)
CO2: 30 mmol/L (ref 22–32)
Calcium: 9.9 mg/dL (ref 8.9–10.3)
Chloride: 103 mmol/L (ref 98–111)
Creatinine: 1.01 mg/dL (ref 0.61–1.24)
GFR, Est AFR Am: >60 mL/min (ref >60)
GFR, Estimated: >60 mL/min (ref >60)
Glucose, Bld: 122 mg/dL — ABNORMAL HIGH (ref 70–99)
Potassium: 4.1 mmol/L (ref 3.5–5.1)
Sodium: 141 mmol/L (ref 135–145)
Total Bilirubin: 0.4 mg/dL (ref 0.3–1.2)
Total Protein: 7 g/dL (ref 6.5–8.1)

## 2020-04-11 LAB — SAMPLE TO BLOOD BANK

## 2020-04-11 LAB — LACTATE DEHYDROGENASE: LDH: 445 U/L — ABNORMAL HIGH (ref 98–192)

## 2020-04-11 LAB — RETICULOCYTES
Immature Retic Fract: 7.7 % (ref 2.3–15.9)
RBC.: 4.96 MIL/uL (ref 4.22–5.81)
Retic Count, Absolute: 26.3 10*3/uL (ref 19.0–186.0)
Retic Ct Pct: 0.5 % (ref 0.4–3.1)

## 2020-04-11 MED ORDER — SODIUM CHLORIDE 0.9 % IV SOLN
510.0000 mg | Freq: Once | INTRAVENOUS | Status: AC
  Administered 2020-04-11: 510 mg via INTRAVENOUS
  Filled 2020-04-11: qty 17

## 2020-04-11 MED ORDER — SODIUM CHLORIDE 0.9% FLUSH
10.0000 mL | Freq: Once | INTRAVENOUS | Status: AC | PRN
  Administered 2020-04-11: 10 mL
  Filled 2020-04-11: qty 10

## 2020-04-11 MED ORDER — HEPARIN SOD (PORK) LOCK FLUSH 100 UNIT/ML IV SOLN
250.0000 [IU] | Freq: Once | INTRAVENOUS | Status: DC | PRN
  Filled 2020-04-11: qty 5

## 2020-04-11 MED ORDER — SODIUM CHLORIDE 0.9 % IV SOLN
Freq: Once | INTRAVENOUS | Status: AC
  Filled 2020-04-11: qty 250

## 2020-04-11 MED ORDER — HEPARIN SOD (PORK) LOCK FLUSH 100 UNIT/ML IV SOLN
500.0000 [IU] | Freq: Once | INTRAVENOUS | Status: AC | PRN
  Administered 2020-04-11: 500 [IU]
  Filled 2020-04-11: qty 5

## 2020-04-11 MED ORDER — ENTRESTO 49-51 MG PO TABS: 1.0000 | ORAL_TABLET | Freq: Two times a day (BID) | ORAL | 11 refills | Status: DC

## 2020-04-11 NOTE — Telephone Encounter (Signed)
I started a Entresto PA through covermymeds. Key: [id number]  This is Dr Kathalene Frames pt so the approval/denial ketter maybe faxed to our [place] office.

## 2020-04-11 NOTE — Telephone Encounter (Signed)
Patient's wife called back. entresto was denied at pharmacy. I have sent prior auth to insurance company. Awaiting response

## 2020-04-11 NOTE — Telephone Encounter (Signed)
Entresto approved though 04/11/21. Patients wife made aware. [facility] has already re-run it

## 2020-04-11 NOTE — Telephone Encounter (Signed)
I have already competed PA. Approved for 1 year

## 2020-04-11 NOTE — Patient Instructions (Signed)

## 2020-04-11 NOTE — Patient Instructions (Addendum)
Ferumoxytol injection (Feraheme) What is this medicine? FERUMOXYTOL is an iron complex. Iron is used to make healthy red blood cells, which carry oxygen and nutrients throughout the body. This medicine is used to treat iron deficiency anemia. This medicine may be used for other purposes; ask your health care provider or pharmacist if you have questions. COMMON BRAND NAME(S): Feraheme What should I tell my health care provider before I take this medicine? They need to know if you have any of these conditions:  anemia not caused by low iron levels  high levels of iron in the blood  magnetic resonance imaging (MRI) test scheduled  an unusual or allergic reaction to iron, other medicines, foods, dyes, or preservatives  pregnant or trying to get pregnant  breast-feeding How should I use this medicine? This medicine is for injection into a vein. It is given by a health care professional in a hospital or clinic setting. Talk to your pediatrician regarding the use of this medicine in children. Special care may be needed. Overdosage: If you think you have taken too much of this medicine contact a poison control center or emergency room at once. NOTE: This medicine is only for you. Do not share this medicine with others. What if I miss a dose? It is important not to miss your dose. Call your doctor or health care professional if you are unable to keep an appointment. What may interact with this medicine? This medicine may interact with the following medications:  other iron products This list may not describe all possible interactions. Give your health care provider a list of all the medicines, herbs, non-prescription drugs, or dietary supplements you use. Also tell them if you smoke, drink alcohol, or use illegal drugs. Some items may interact with your medicine. What should I watch for while using this medicine? Visit your doctor or healthcare professional regularly. Tell your doctor or  healthcare professional if your symptoms do not start to get better or if they get worse. You may need blood work done while you are taking this medicine. You may need to follow a special diet. Talk to your doctor. Foods that contain iron include: whole grains/cereals, dried fruits, beans, or peas, leafy green vegetables, and organ meats (liver, kidney). What side effects may I notice from receiving this medicine? Side effects that you should report to your doctor or health care professional as soon as possible:  allergic reactions like skin rash, itching or hives, swelling of the face, lips, or tongue  breathing problems  changes in blood pressure  feeling faint or lightheaded, falls  fever or chills  flushing, sweating, or hot feelings  swelling of the ankles or feet Side effects that usually do not require medical attention (report to your doctor or health care professional if they continue or are bothersome):  diarrhea  headache  nausea, vomiting  stomach pain This list may not describe all possible side effects. Call your doctor for medical advice about side effects. You may report side effects to FDA at 1-800-FDA-1088. Where should I keep my medicine? This drug is given in a hospital or clinic and will not be stored at home. NOTE: This sheet is a summary. It may not cover all possible information. If you have questions about this medicine, talk to your doctor, pharmacist, or health care provider.  2020 Elsevier/Gold Standard (2016-11-24 20:21:10)  

## 2020-04-11 NOTE — Progress Notes (Signed)
Day 2 of Feraheme, day 1 given inpatient. Okay to continue same product per Otilio Carpen, [employer].

## 2020-04-11 NOTE — Telephone Encounter (Signed)
K has normalized. With K of 4.7 and low BP in the AM, I will continue Entresto at the same dose of 49/51mg  BID. Rx sent to [facility]. I spoke with wife and informed her that rx was sent and K was good. Follow up with Dr. Audie Box in July. Wife appreciative of the call.

## 2020-04-11 NOTE — Telephone Encounter (Signed)
Thank you :)

## 2020-04-11 NOTE — Progress Notes (Signed)
Patient feels well today. He states he tolerated his first treatment well without any problems. He's not had to take any prn's. He's ready to start with treatment number two.  Patient and his wife expressed some difficulty with everything that has transpired over the last few weeks/months. Patient's medical needs have been numerous and this has been stressful on both of them. Kenneth Lyons is interested in caregiver support that might be available. Placed Social Work consult and sent a message to our social workers.

## 2020-04-11 NOTE — Progress Notes (Signed)
Hematology and Oncology Follow Up Visit  Kenneth Lyons [id number] 07-28-1941 79 y.o. 04/11/2020   Principle Diagnosis:   Metastatic renal cell carcinoma of the left kidney with multiple lymph node metastasis  Anemia secondary to renal cell carcinoma  Iron deficiency anemia secondary to malabsorption  Hypercalcemia secondary to renal cell carcinoma  Current Therapy:    Nivolumab/Cabometyx --s/p cycle 1  - started on 03/21/2020  Xgeva 120 mg subcu every 3 months -next dose on 06/2020  IV iron as indicated-dose given on 03/16/2020     Interim History:  Kenneth Lyons is back for follow-up.  Finally, he is starting to look better.  His hemoglobin is now up to 12.3.  I think this has to be a good sign for [place].  He has had no problems with the Cabometyx to date.  There is no diarrhea.  His blood pressure is doing okay.  He has had no rashes.  He has had the first cycle of nivolumab.  This also seems to be doing okay with him.  He has had no diarrhea.  His wife thinks that he is eating a little bit better.  He is not having as much back pain.  Hopefully, the tumor is shrinking.  He has had no issues with cough or shortness of breath.  His memory is getting little bit worse.  He has dementia.  I think the recent hospitalization probably was a problem for him.  He has had no problems with nausea or vomiting.  There is no change in bowel or bladder habits.  Currently, I would have to say that his performance status is ECOG 2.    Medications:  Current Outpatient Medications:  .  alfuzosin (UROXATRAL) 10 MG 24 hr tablet, Take 10 mg by mouth daily with breakfast. , Disp: , Rfl:  .  cabozantinib (CABOMETYX) 40 MG tablet, Take 1 tablet (40 mg total) by mouth daily. Take on an empty stomach, 1 hour before or 2 hours after meals., Disp: 30 tablet, Rfl: 6 .  Calcium-Magnesium-Vitamin D (CALCIUM 1200+D3 PO), Take 1 tablet by mouth daily., Disp: , Rfl:  .  carvedilol (COREG) 25 MG tablet,  Take 25 mg by mouth 2 (two) times daily with a meal. , Disp: , Rfl:  .  cetirizine (ZYRTEC) 10 MG tablet, Take 10 mg by mouth daily., Disp: , Rfl:  .  Cholecalciferol (VITAMIN D-1000 MAX ST) 25 MCG (1000 UT) tablet, Take 1 capsule by mouth daily., Disp: , Rfl:  .  Cyanocobalamin 5000 MCG SUBL, Place 5,000 mcg under the tongue daily. Place one dose under the tongue daily , Disp: , Rfl:  .  Dulaglutide (TRULICITY) 2.11 HE/1.7EY SOPN, Inject 0.75 mg into the skin every Wednesday. , Disp: , Rfl:  .  escitalopram (LEXAPRO) 10 MG tablet, Take 10 mg by mouth daily. , Disp: , Rfl:  .  fenofibrate 160 MG tablet, Take 160 mg by mouth daily., Disp: , Rfl:  .  fentaNYL (DURAGESIC) 25 MCG/HR, Place 1 patch onto the skin every 3 (three) days., Disp: 10 patch, Rfl: 0 .  furosemide (LASIX) 40 MG tablet, Take 1 tablet (40 mg total) by mouth daily. (Patient taking differently: Take 20 mg by mouth daily. ), Disp: 30 tablet, Rfl: 0 .  gabapentin (NEURONTIN) 100 MG capsule, Take 100 mg by mouth at bedtime as needed (pain). , Disp: , Rfl:  .  glipiZIDE (GLUCOTROL) 10 MG tablet, Take 10 mg by mouth daily before breakfast. Take with glipizide 5 mg  to equal 15 mg, Disp: , Rfl:  .  glipiZIDE (GLUCOTROL) 5 MG tablet, Take 5 mg by mouth daily before breakfast. Take with glipizide 10 mg to equal 15 mg, Disp: , Rfl:  .  Glucosamine-Chondroitin 250-200 MG TABS, Take 1 tablet by mouth 2 (two) times daily., Disp: , Rfl:  .  HYDROcodone-acetaminophen (NORCO) 7.5-325 MG tablet, Take 1 tablet by mouth every 6 (six) hours as needed for moderate pain., Disp: 60 tablet, Rfl: 0 .  memantine (NAMENDA) 10 MG tablet, Take 10 mg by mouth 2 (two) times daily. , Disp: , Rfl:  .  metFORMIN (GLUCOPHAGE) 1000 MG tablet, Take 1,000 mg by mouth 2 (two) times daily with a meal. , Disp: , Rfl:  .  Multiple Vitamin (MULTIVITAMIN) tablet, Take 1 tablet by mouth daily., Disp: , Rfl:  .  Omega-3 Fatty Acids (FISH OIL PO), Take 2 capsules by mouth daily.,  Disp: , Rfl:  .  ONETOUCH VERIO test strip, 1 each daily., Disp: , Rfl:  .  rosuvastatin (CRESTOR) 20 MG tablet, Take 20 mg by mouth daily., Disp: , Rfl:  .  sacubitril-valsartan (ENTRESTO) 49-51 MG, Take 1 tablet by mouth 2 (two) times daily., Disp: 60 tablet, Rfl: 11 .  selenium 50 MCG TABS, Take 50 mcg by mouth daily., Disp: , Rfl:   Allergies: No Known Allergies  Past Medical History, Surgical history, Social history, and Family History were reviewed and updated.  Review of Systems: Review of Systems  Constitutional: Positive for appetite change, fatigue and unexpected weight change.  HENT:  Negative.   Eyes: Negative.   Respiratory: Positive for shortness of breath.   Cardiovascular: Positive for leg swelling.  Gastrointestinal: Positive for diarrhea and nausea.  Endocrine: Negative.   Genitourinary: Negative.    Musculoskeletal: Positive for back pain and neck pain.  Skin: Negative.   Neurological: Negative.   Hematological: Negative.   Psychiatric/Behavioral: Negative.     Physical Exam:  weight is 163 lb (73.9 kg). His temporal temperature is 96.9 F (36.1 C) (abnormal). His blood pressure is 122/66 and his pulse is 71. His respiration is 19 and oxygen saturation is 100%.   Wt Readings from Last 3 Encounters:  04/11/20 163 lb (73.9 kg)  04/10/20 165 lb (74.8 kg)  04/04/20 160 lb 11.2 oz (72.9 kg)    Physical Exam Vitals reviewed.  Constitutional:      Comments: This is a pale appearing elderly looking white male.  He is is in no obvious distress.  HENT:     Head: Normocephalic and atraumatic.  Eyes:     Pupils: Pupils are equal, round, and reactive to light.  Cardiovascular:     Rate and Rhythm: Normal rate and regular rhythm.     Heart sounds: Normal heart sounds.     Comments: Cardiac exam shows a regular rate and rhythm.  He has a 1/6 systolic ejection murmur. Pulmonary:     Effort: Pulmonary effort is normal.     Breath sounds: Normal breath sounds.    Abdominal:     General: Bowel sounds are normal.     Palpations: Abdomen is soft.  Musculoskeletal:        General: No tenderness or deformity. Normal range of motion.     Cervical back: Normal range of motion.     Comments: His extremities shows swelling of the left arm and left leg.  This is pitting edema in both upper and lower extremities.  He has some weakness in both  arms and legs.  He has decent range of motion.  He has decent pulses in his distal extremities.  Lymphadenopathy:     Cervical: No cervical adenopathy.  Skin:    General: Skin is warm and dry.     Findings: No erythema or rash.  Neurological:     Mental Status: He is alert and oriented to person, place, and time.  Psychiatric:        Behavior: Behavior normal.        Thought Content: Thought content normal.        Judgment: Judgment normal.      Lab Results  Component Value Date   WBC 6.7 04/11/2020   HGB 12.3 (L) 04/11/2020   HCT 39.9 04/11/2020   MCV 80.8 04/11/2020   PLT 241 04/11/2020     Chemistry      Component Value Date/Time   NA 141 04/11/2020 1227   NA 139 04/10/2020 1157   K 4.1 04/11/2020 1227   CL 103 04/11/2020 1227   CO2 30 04/11/2020 1227   BUN 30 (H) 04/11/2020 1227   BUN 32 (H) 04/10/2020 1157   CREATININE 1.01 04/11/2020 1227      Component Value Date/Time   CALCIUM 9.9 04/11/2020 1227   ALKPHOS 58 04/11/2020 1227   AST 43 (H) 04/11/2020 1227   ALT 35 04/11/2020 1227   BILITOT 0.4 04/11/2020 1227      Impression and Plan: Kenneth Lyons is a very nice 79 year old white male.  He has metastatic renal cell carcinoma.  Thankfully, the renal cell carcinoma is only lymph nodes and not in the organs.  I have to believe that he will respond.  His labs look a lot better.  His renal function is improving.  His calcium also is improving.  We will continue on the nivolumab/Cabometyx protocol.  He is on intermediate dose of Cabometyx.  Probably after the fourth cycle of treatment  with nivolumab, we will scan him again.  At least, his quality life is better.  I forgot to mention that his daughter came down from [place].  She actually has had breast cancer herself.  She also harbors the BRCA2 gene.   We will plan to get her back in another month.  Volanda Napoleon, MD 6/23/20211:17 PM

## 2020-04-12 ENCOUNTER — Encounter: Payer: Self-pay | Admitting: General Practice

## 2020-04-12 LAB — TSH: TSH: 3.065 u[IU]/mL (ref 0.320–4.118)

## 2020-04-12 LAB — FERRITIN: Ferritin: 1230 ng/mL — ABNORMAL HIGH (ref 24–336)

## 2020-04-12 LAB — IRON AND TIBC
Iron: 74 ug/dL (ref 42–163)
Saturation Ratios: 18 % — ABNORMAL LOW (ref 20–55)
TIBC: 403 ug/dL (ref 202–409)
UIBC: 328 ug/dL (ref 117–376)

## 2020-04-12 NOTE — Progress Notes (Signed)
[facility] Initial Psychosocial Assessment Clinical Social Work  Clinical Social Work contacted by phone to assess psychosocial, emotional, mental health, and spiritual needs of the patient.   Barriers to care/review of distress screen:  - Transportation:  Do you anticipate any problems getting to appointments?  Do you have someone who can help run errands for you if you need it?  Wife - Help at home:  What is your living situation (alone, family, other)?  If you are physically unable to care for yourself, who would you call on to help you?  Lives w wife - no other support in the home.  Neighbors are able to help w errands etc.   - Support system:  What does your support system look like?  Who would you call on if you needed some kind of practical help?  What if you needed someone to talk to for emotional support?  Adult children live out of town, come in for brief periods of time.  Wife states that she is healthy and "able to help him."  She would like to have some respite care, but patient has adamantly refused to consider allowing anyone else to help w his care other than his spouse or children.   - Finances:  Are you concerned about finances.  Considering returning to work?  If not, applying for disability?  No concerns expressed  What is your understanding of where you are with your cancer? Its cause?  Your treatment plan and what happens next?  Diagnosed w renal cancer at end of May after noticing swelling in leg and abdomen.  Had heart attack at end of May "We had not been able to process the kidney cancer when he then had a heart attack."   Having trouble processing multiple medical issues.  Had home health PT which concluded today - "it got him stronger after he discharged from the hospital, that has been extremely helpful."  Recent hospitalization has made memory issues "worse", has been on Namenda for approx 4 years.  "He used to be able to take his own blood sugar, now it is frustrating."  Children  would like wife to have some caregiver respite - patient has been very resistant to having anyone other than the wife provide care.  Per wife, it has been difficult to understand the diagnosis of kidney cancer and related treatment options/choices.  States that the health decline has been swift and significant - especially in the context of cognitive decline and increasing dependence over the past 4 years.    What are your worries for the future as you begin treatment for cancer?  Wife is sole caregiver.  Although she remains able to provide needed care, the experience has been very wearing.    CSW Summary:  Patient and family psychosocial functioning including strengths, limitations, and coping skills:   79 year old married male, newly diagnosed with kidney cancer in the context of chronic health conditions and cognitive decline.  Shortly after diagnosis of cancer, had a heart attack and is now recovering at home.  Does not drive, cannot take his blood sugars, becomes frustrated when wife talks about getting help at home in order to give her some caregiver respite.  "He sleeps most of the day and night" so she is able to read or do chores around the house.  States he becomes upset when the subject of getting someone into the home to help w his care is broached.  He will not accept anyone else  caring for him at this time.  Cognitive decline accelerated after recent hospitalization.    Identifications of barriers to care:  Patient resists wife's attempts to get help w his care in the home, adult children live out of town and are available only some of the time.  Wife/caregiver is sole provider of most of the needed care in the home and has been doing this for past 4 years.  Children would like her to have respite - she has a voucher for this from [employer] but has been unable to use it as patient has resisted having anyone else come into the home.    Availability of community resources:  [employer] [place] has offered a caregiver respite voucher for next 90 days, [employer] training [employer] education officer via [place] referral.  Discussed possibility that [place] may be able to identify resources and support for wife.    Clinical Social Worker follow up needed: Yes.    Will call in two weeks to check in  Edwyna Shell, [place] Worker Phone:  [phone] Cell:  [phone]

## 2020-04-19 ENCOUNTER — Inpatient Hospital Stay: Payer: HMO | Attending: Hematology & Oncology

## 2020-04-19 ENCOUNTER — Inpatient Hospital Stay (HOSPITAL_BASED_OUTPATIENT_CLINIC_OR_DEPARTMENT_OTHER): Payer: HMO | Admitting: Hematology & Oncology

## 2020-04-19 ENCOUNTER — Inpatient Hospital Stay: Payer: HMO

## 2020-04-19 ENCOUNTER — Encounter: Payer: Self-pay | Admitting: *Deleted

## 2020-04-19 ENCOUNTER — Encounter: Payer: Self-pay | Admitting: Hematology & Oncology

## 2020-04-19 ENCOUNTER — Other Ambulatory Visit: Payer: Self-pay

## 2020-04-19 VITALS — BP 147/65 | HR 75 | Temp 97.9°F | Resp 18 | Wt 163.0 lb

## 2020-04-19 DIAGNOSIS — C649 Malignant neoplasm of unspecified kidney, except renal pelvis: Secondary | ICD-10-CM

## 2020-04-19 DIAGNOSIS — D500 Iron deficiency anemia secondary to blood loss (chronic): Secondary | ICD-10-CM

## 2020-04-19 DIAGNOSIS — C778 Secondary and unspecified malignant neoplasm of lymph nodes of multiple regions: Secondary | ICD-10-CM

## 2020-04-19 DIAGNOSIS — D631 Anemia in chronic kidney disease: Secondary | ICD-10-CM | POA: Insufficient documentation

## 2020-04-19 DIAGNOSIS — K909 Intestinal malabsorption, unspecified: Secondary | ICD-10-CM | POA: Diagnosis not present

## 2020-04-19 DIAGNOSIS — Z5111 Encounter for antineoplastic chemotherapy: Secondary | ICD-10-CM | POA: Insufficient documentation

## 2020-04-19 DIAGNOSIS — F0390 Unspecified dementia without behavioral disturbance: Secondary | ICD-10-CM | POA: Insufficient documentation

## 2020-04-19 DIAGNOSIS — C642 Malignant neoplasm of left kidney, except renal pelvis: Secondary | ICD-10-CM

## 2020-04-19 DIAGNOSIS — Z7984 Long term (current) use of oral hypoglycemic drugs: Secondary | ICD-10-CM | POA: Diagnosis not present

## 2020-04-19 DIAGNOSIS — Z9181 History of falling: Secondary | ICD-10-CM | POA: Insufficient documentation

## 2020-04-19 DIAGNOSIS — D509 Iron deficiency anemia, unspecified: Secondary | ICD-10-CM | POA: Insufficient documentation

## 2020-04-19 DIAGNOSIS — E119 Type 2 diabetes mellitus without complications: Secondary | ICD-10-CM | POA: Diagnosis not present

## 2020-04-19 DIAGNOSIS — Z79899 Other long term (current) drug therapy: Secondary | ICD-10-CM | POA: Diagnosis not present

## 2020-04-19 DIAGNOSIS — Z95828 Presence of other vascular implants and grafts: Secondary | ICD-10-CM

## 2020-04-19 DIAGNOSIS — Z7982 Long term (current) use of aspirin: Secondary | ICD-10-CM | POA: Insufficient documentation

## 2020-04-19 LAB — CBC WITH DIFFERENTIAL (CANCER CENTER ONLY)
Abs Immature Granulocytes: 0.02 10*3/uL (ref 0.00–0.07)
Basophils Absolute: 0 10*3/uL (ref 0.0–0.1)
Basophils Relative: 1 %
Eosinophils Absolute: 0.3 10*3/uL (ref 0.0–0.5)
Eosinophils Relative: 4 %
HCT: 38.5 % — ABNORMAL LOW (ref 39.0–52.0)
Hemoglobin: 11.8 g/dL — ABNORMAL LOW (ref 13.0–17.0)
Immature Granulocytes: 0 %
Lymphocytes Relative: 11 %
Lymphs Abs: 0.8 10*3/uL (ref 0.7–4.0)
MCH: 25.1 pg — ABNORMAL LOW (ref 26.0–34.0)
MCHC: 30.6 g/dL (ref 30.0–36.0)
MCV: 81.9 fL (ref 80.0–100.0)
Monocytes Absolute: 0.3 10*3/uL (ref 0.1–1.0)
Monocytes Relative: 5 %
Neutro Abs: 5.6 10*3/uL (ref 1.7–7.7)
Neutrophils Relative %: 79 %
Platelet Count: 192 10*3/uL (ref 150–400)
RBC: 4.7 MIL/uL (ref 4.22–5.81)
RDW: 20.9 % — ABNORMAL HIGH (ref 11.5–15.5)
WBC Count: 7 10*3/uL (ref 4.0–10.5)
nRBC: 0 % (ref 0.0–0.2)

## 2020-04-19 LAB — CMP (CANCER CENTER ONLY)
ALT: 36 U/L (ref 0–44)
AST: 39 U/L (ref 15–41)
Albumin: 3.8 g/dL (ref 3.5–5.0)
Alkaline Phosphatase: 56 U/L (ref 38–126)
Anion gap: 7 (ref 5–15)
BUN: 25 mg/dL — ABNORMAL HIGH (ref 8–23)
CO2: 30 mmol/L (ref 22–32)
Calcium: 10 mg/dL (ref 8.9–10.3)
Chloride: 104 mmol/L (ref 98–111)
Creatinine: 0.95 mg/dL (ref 0.61–1.24)
GFR, Est AFR Am: >60 mL/min (ref >60)
GFR, Estimated: >60 mL/min (ref >60)
Glucose, Bld: 140 mg/dL — ABNORMAL HIGH (ref 70–99)
Potassium: 3.7 mmol/L (ref 3.5–5.1)
Sodium: 141 mmol/L (ref 135–145)
Total Bilirubin: 0.3 mg/dL (ref 0.3–1.2)
Total Protein: 6.8 g/dL (ref 6.5–8.1)

## 2020-04-19 LAB — LACTATE DEHYDROGENASE: LDH: 452 U/L — ABNORMAL HIGH (ref 98–192)

## 2020-04-19 MED ORDER — SODIUM CHLORIDE 0.9% FLUSH
10.0000 mL | Freq: Once | INTRAVENOUS | Status: AC
  Administered 2020-04-19: 10 mL via INTRAVENOUS
  Filled 2020-04-19: qty 10

## 2020-04-19 MED ORDER — SODIUM CHLORIDE 0.9 % IV SOLN
Freq: Once | INTRAVENOUS | Status: AC
  Filled 2020-04-19: qty 250

## 2020-04-19 MED ORDER — SODIUM CHLORIDE 0.9 % IV SOLN
480.0000 mg | Freq: Once | INTRAVENOUS | Status: AC
  Administered 2020-04-19: 480 mg via INTRAVENOUS
  Filled 2020-04-19: qty 48

## 2020-04-19 MED ORDER — HEPARIN SOD (PORK) LOCK FLUSH 100 UNIT/ML IV SOLN
500.0000 [IU] | Freq: Once | INTRAVENOUS | Status: AC | PRN
  Administered 2020-04-19: 500 [IU]
  Filled 2020-04-19: qty 5

## 2020-04-19 MED ORDER — SODIUM CHLORIDE 0.9% FLUSH
10.0000 mL | INTRAVENOUS | Status: DC | PRN
  Administered 2020-04-19: 10 mL
  Filled 2020-04-19: qty 10

## 2020-04-19 NOTE — Patient Instructions (Signed)
[facility] Discharge Instructions for Patients Receiving Chemotherapy  Today you received the following chemotherapy agents Opdivo.  To help prevent nausea and vomiting after your treatment, we encourage you to take your nausea medication as directed.   If you develop nausea and vomiting that is not controlled by your nausea medication, call the clinic.   BELOW ARE SYMPTOMS THAT SHOULD BE REPORTED IMMEDIATELY:  *FEVER GREATER THAN 100.5 F  *CHILLS WITH OR WITHOUT FEVER  NAUSEA AND VOMITING THAT IS NOT CONTROLLED WITH YOUR NAUSEA MEDICATION  *UNUSUAL SHORTNESS OF BREATH  *UNUSUAL BRUISING OR BLEEDING  TENDERNESS IN MOUTH AND THROAT WITH OR WITHOUT PRESENCE OF ULCERS  *URINARY PROBLEMS  *BOWEL PROBLEMS  UNUSUAL RASH Items with * indicate a potential emergency and should be followed up as soon as possible.  Feel free to call the clinic you have any questions or concerns. The clinic phone number is [phone].  Please show the CHEMO ALERT CARD at check-in to the Emergency Department and triage nurse.    

## 2020-04-19 NOTE — Progress Notes (Signed)
Visited with patient and wife prior to his appointment. I provided patient with "The Cancer Caregiver's Guidebook" to provide some additional caregiver support. Kenneth Lyons is still stressed and mentions that patient is not open to having her have outside help so that she can leave the home without taking him, or risking him being home alone. I tried to encourage patient to accept outside help and that maybe his wife just needed occasional breaks. He nodded in agreement, but didn't offer anything additional. Kenneth Lyons states patient has had intermittent confusion, but seemed worse this morning.   Oncology Nurse Navigator Documentation  Oncology Nurse Navigator Flowsheets 04/19/2020  Abnormal Finding Date -  Diagnosis Status -  Planned Course of Treatment -  Phase of Treatment -  Chemotherapy Pending- Reason: -  Chemotherapy Actual Start Date: -  Navigator Follow Up Date: 05/17/2020  Navigator Follow Up Reason: Follow-up Appointment;Chemotherapy  Navigator Location [facility]  Referral Date to RadOnc/MedOnc -  Navigator Encounter Type Follow-up Appt  Telephone -  Treatment Initiated Date -  Patient Visit Type MedOnc  Treatment Phase Active Tx  Barriers/Navigation Needs Anxiety;Family Concerns;Morbidities/Frailty  Education -  Interventions Psycho-Social Support  Acuity Level 2-Minimal Needs (1-2 Barriers Identified)  Referrals -  Coordination of Care -  Education Method Written  Support Groups/Services Friends and Family  Time Spent with Patient 30

## 2020-04-19 NOTE — Progress Notes (Signed)
Hematology and Oncology Follow Up Visit  Kenneth Lyons [id number] May 12, 1941 79 y.o. 04/19/2020   Principle Diagnosis:   Metastatic renal cell carcinoma of the left kidney with multiple lymph node metastasis  Anemia secondary to renal cell carcinoma  Iron deficiency anemia secondary to malabsorption  Hypercalcemia secondary to renal cell carcinoma  Current Therapy:    Nivolumab/Cabometyx --s/p cycle 1  - started on 03/27/2020  Xgeva 120 mg subcu every 3 months -next dose on 06/2020  IV iron as indicated-dose given on 03/16/2020     Interim History:  Kenneth Lyons is back for follow-up.  He is making slow but steady improvement.  I think the real key is the fact that his hemoglobin is holding steady.  When we first saw him, we had to transfuse him a few times because his hemoglobin was still low.  He is tolerating the Cabometyx pretty well.  He does not seem to be doing all that much exercising.  He is not walking as much.  He is not doing physical therapy at home.  There is no bleeding.  He has had no fever.  He has had no diarrhea.  He may have occasional loose bowel.  His memory issues are still present.  I do not think that they really have changed.  Overall, his performance status is probably ECOG 2.   Medications:  Current Outpatient Medications:  .  alfuzosin (UROXATRAL) 10 MG 24 hr tablet, Take 10 mg by mouth daily with breakfast. , Disp: , Rfl:  .  cabozantinib (CABOMETYX) 40 MG tablet, Take 1 tablet (40 mg total) by mouth daily. Take on an empty stomach, 1 hour before or 2 hours after meals., Disp: 30 tablet, Rfl: 6 .  Calcium-Magnesium-Vitamin D (CALCIUM 1200+D3 PO), Take 1 tablet by mouth daily., Disp: , Rfl:  .  carvedilol (COREG) 25 MG tablet, Take 25 mg by mouth 2 (two) times daily with a meal. , Disp: , Rfl:  .  cetirizine (ZYRTEC) 10 MG tablet, Take 10 mg by mouth daily., Disp: , Rfl:  .  Cholecalciferol (VITAMIN D-1000 MAX ST) 25 MCG (1000 UT) tablet, Take 1  capsule by mouth daily., Disp: , Rfl:  .  Cyanocobalamin 5000 MCG SUBL, Place 5,000 mcg under the tongue daily. Place one dose under the tongue daily , Disp: , Rfl:  .  Dulaglutide (TRULICITY) 9.38 HW/2.9HB SOPN, Inject 0.75 mg into the skin every Wednesday. , Disp: , Rfl:  .  escitalopram (LEXAPRO) 10 MG tablet, Take 10 mg by mouth daily. , Disp: , Rfl:  .  fenofibrate 160 MG tablet, Take 160 mg by mouth daily., Disp: , Rfl:  .  fentaNYL (DURAGESIC) 25 MCG/HR, Place 1 patch onto the skin every 3 (three) days., Disp: 10 patch, Rfl: 0 .  furosemide (LASIX) 40 MG tablet, Take 1 tablet (40 mg total) by mouth daily. (Patient taking differently: Take 20 mg by mouth daily. ), Disp: 30 tablet, Rfl: 0 .  gabapentin (NEURONTIN) 100 MG capsule, Take 100 mg by mouth at bedtime as needed (pain). , Disp: , Rfl:  .  glipiZIDE (GLUCOTROL) 10 MG tablet, Take 10 mg by mouth daily before breakfast. Take with glipizide 5 mg to equal 15 mg, Disp: , Rfl:  .  glipiZIDE (GLUCOTROL) 5 MG tablet, Take 5 mg by mouth daily before breakfast. Take with glipizide 10 mg to equal 15 mg, Disp: , Rfl:  .  Glucosamine-Chondroitin 250-200 MG TABS, Take 1 tablet by mouth 2 (two) times daily.,  Disp: , Rfl:  .  HYDROcodone-acetaminophen (NORCO) 7.5-325 MG tablet, Take 1 tablet by mouth every 6 (six) hours as needed for moderate pain., Disp: 60 tablet, Rfl: 0 .  memantine (NAMENDA) 10 MG tablet, Take 10 mg by mouth 2 (two) times daily. , Disp: , Rfl:  .  metFORMIN (GLUCOPHAGE) 1000 MG tablet, Take 1,000 mg by mouth 2 (two) times daily with a meal. , Disp: , Rfl:  .  Multiple Vitamin (MULTIVITAMIN) tablet, Take 1 tablet by mouth daily., Disp: , Rfl:  .  Omega-3 Fatty Acids (FISH OIL PO), Take 2 capsules by mouth daily., Disp: , Rfl:  .  ONETOUCH VERIO test strip, 1 each daily., Disp: , Rfl:  .  rosuvastatin (CRESTOR) 20 MG tablet, Take 20 mg by mouth daily., Disp: , Rfl:  .  sacubitril-valsartan (ENTRESTO) 49-51 MG, Take 1 tablet by mouth  2 (two) times daily., Disp: 60 tablet, Rfl: 11 .  selenium 50 MCG TABS, Take 50 mcg by mouth daily., Disp: , Rfl:   Allergies: No Known Allergies  Past Medical History, Surgical history, Social history, and Family History were reviewed and updated.  Review of Systems: Review of Systems  Constitutional: Positive for appetite change, fatigue and unexpected weight change.  HENT:  Negative.   Eyes: Negative.   Respiratory: Positive for shortness of breath.   Cardiovascular: Positive for leg swelling.  Gastrointestinal: Positive for diarrhea and nausea.  Endocrine: Negative.   Genitourinary: Negative.    Musculoskeletal: Positive for back pain and neck pain.  Skin: Negative.   Neurological: Negative.   Hematological: Negative.   Psychiatric/Behavioral: Negative.     Physical Exam:  weight is 163 lb (73.9 kg). His temporal temperature is 97.9 F (36.6 C). His blood pressure is 147/65 (abnormal) and his pulse is 75. His respiration is 18 and oxygen saturation is 95%.   Wt Readings from Last 3 Encounters:  04/19/20 163 lb (73.9 kg)  04/19/20 163 lb (73.9 kg)  04/11/20 163 lb (73.9 kg)    Physical Exam Vitals reviewed.  Constitutional:      Comments: This is a pale appearing elderly looking white male.  He is is in no obvious distress.  HENT:     Head: Normocephalic and atraumatic.  Eyes:     Pupils: Pupils are equal, round, and reactive to light.  Cardiovascular:     Rate and Rhythm: Normal rate and regular rhythm.     Heart sounds: Normal heart sounds.     Comments: Cardiac exam shows a regular rate and rhythm.  He has a 1/6 systolic ejection murmur. Pulmonary:     Effort: Pulmonary effort is normal.     Breath sounds: Normal breath sounds.  Abdominal:     General: Bowel sounds are normal.     Palpations: Abdomen is soft.  Musculoskeletal:        General: No tenderness or deformity. Normal range of motion.     Cervical back: Normal range of motion.     Comments: His  extremities shows swelling of the left arm and left leg.  This is pitting edema in both upper and lower extremities.  He has some weakness in both arms and legs.  He has decent range of motion.  He has decent pulses in his distal extremities.  Lymphadenopathy:     Cervical: No cervical adenopathy.  Skin:    General: Skin is warm and dry.     Findings: No erythema or rash.  Neurological:  Mental Status: He is alert and oriented to person, place, and time.  Psychiatric:        Behavior: Behavior normal.        Thought Content: Thought content normal.        Judgment: Judgment normal.      Lab Results  Component Value Date   WBC 7.0 04/19/2020   HGB 11.8 (L) 04/19/2020   HCT 38.5 (L) 04/19/2020   MCV 81.9 04/19/2020   PLT 192 04/19/2020     Chemistry      Component Value Date/Time   NA 141 04/19/2020 1150   NA 139 04/10/2020 1157   K 3.7 04/19/2020 1150   CL 104 04/19/2020 1150   CO2 30 04/19/2020 1150   BUN 25 (H) 04/19/2020 1150   BUN 32 (H) 04/10/2020 1157   CREATININE 0.95 04/19/2020 1150      Component Value Date/Time   CALCIUM 10.0 04/19/2020 1150   ALKPHOS 56 04/19/2020 1150   AST 39 04/19/2020 1150   ALT 36 04/19/2020 1150   BILITOT 0.3 04/19/2020 1150      Impression and Plan: Mr. Silveria is a very nice 79 year old white male.  He has metastatic renal cell carcinoma.  Thankfully, the renal cell carcinoma is only lymph nodes and not in the organs.  I have to believe that he is responding.  I think the fact that his hemoglobin is holding steady is a good sign.Marland Kitchen  His labs look a lot better.  His renal function is improving.  His calcium also is improving.  We will continue on the nivolumab/Cabometyx protocol.  He is on intermediate dose of Cabometyx.  This will be his second dose of nivolumab.  After the fourth cycle, we will rescan him.  His iron is on the low side.  We will go ahead and give him another dose of iron..   We will plan to get him back in  another month.  Volanda Napoleon, MD 7/1/202112:25 PM

## 2020-04-20 ENCOUNTER — Encounter: Payer: Self-pay | Admitting: General Practice

## 2020-04-20 LAB — IRON AND TIBC
Iron: 49 ug/dL (ref 42–163)
Saturation Ratios: 14 % — ABNORMAL LOW (ref 20–55)
TIBC: 351 ug/dL (ref 202–409)
UIBC: 302 ug/dL (ref 117–376)

## 2020-04-20 LAB — FERRITIN: Ferritin: 2582 ng/mL — ABNORMAL HIGH (ref 24–336)

## 2020-04-20 NOTE — Progress Notes (Signed)
[facility] CSW Progress Notes  Family referred to [facility] Navigator at [facility] for caregiver support/resources via [place] [employer].  N Reynolds, [employer], has called wife twice, left VMs, no return call.  She is trying to connect w spouse and offer support/resources.  Edwyna Shell, LCSW Clinical Social Worker Phone:  [phone]

## 2020-04-22 DIAGNOSIS — C649 Malignant neoplasm of unspecified kidney, except renal pelvis: Secondary | ICD-10-CM | POA: Diagnosis not present

## 2020-04-22 DIAGNOSIS — L8990 Pressure ulcer of unspecified site, unspecified stage: Secondary | ICD-10-CM | POA: Diagnosis not present

## 2020-04-22 DIAGNOSIS — C778 Secondary and unspecified malignant neoplasm of lymph nodes of multiple regions: Secondary | ICD-10-CM | POA: Diagnosis not present

## 2020-04-22 DIAGNOSIS — G301 Alzheimer's disease with late onset: Secondary | ICD-10-CM | POA: Diagnosis not present

## 2020-04-22 DIAGNOSIS — I5023 Acute on chronic systolic (congestive) heart failure: Secondary | ICD-10-CM | POA: Diagnosis not present

## 2020-04-22 DIAGNOSIS — I639 Cerebral infarction, unspecified: Secondary | ICD-10-CM | POA: Diagnosis not present

## 2020-04-24 NOTE — Progress Notes (Signed)
Cardiology Office Note:   Date:  04/27/2020  NAME:  Kenneth Lyons    MRN: [id number] DOB:  [birth year]   PCP:  Nicola Girt, DO  Cardiologist:  Evalina Field, MD  Electrophysiologist:  None   Referring MD: Nicola Girt, DO   Chief Complaint  Patient presents with  . Congestive Heart Failure   History of Present Illness:   Kenneth Lyons is a 79 y.o. male with a hx of systolic HF (19-62%), CAD s/p NSTEMI, metastatic RCC, HTN, DM who presents for follow-up. Admitted to the hospital in May for volume overload after pRBCs. Found to have reduced EF and WMA. Troponin elevated concerning for NSTEMI. Unable to be treated due to anemia. Has had medications optimized by [facility] clinic.   Blood pressure today 135/70.  He remains on Coreg 25 mg twice daily.  He is also on Entresto 49-51 mg twice daily.  He presents with his wife.  We did go over his diagnosis again.  He does suffer from dementia and does have trouble remembering certain things.  Apparently there was a fall a few weeks ago.  This was a mechanical fall per her report.  No major symptoms such as chest pain or shortness of breath reported before the fall.  He still struggles with anemia related to his metastatic renal cell carcinoma.  He has been treated through the [facility] system.  Things appear to be stable.  He endorses no chest pain or shortness of breath.  He does work with home health physical therapy but does not exercise routinely.  Apparently his wife reports that getting him to do much activity is quite hard.  Despite this he reports no symptoms and he has an acceptable quality of life at the moment.  We did discuss that all of his medications could be stopped at any time she would run troubles.  He was restarted on aspirin and is having no issues with bleeding.  Should he have any bruising or issues with bleeding we would stop aspirin immediately.  I did discuss that my overall focus will be quality of life with him.  He does  have metastatic renal cell cancer as well as dementia.  We will be aggressive within reason on his medications.  Problem List 1. Systolic HF, EF 22-97% with WMA -presumed ischemic etiology but due to anemia and RCC not cath candidate  2. CAD -NSTEMI 03/19/2020 with WMA consistent with LAD infarct -unable to treat due to anemia  3. CHB s/p ppm 4. Metastatic RCC -c/b anemia  5. HTN 6. DM -A1c 6.5 7. Mild aortic stenosis   Past Medical History: Past Medical History:  Diagnosis Date  . Alzheimer's dementia ([place])   . Anemia associated with left renal cell cancer treated with erythropoietin ([place]) 03/14/2020  . Cancer ([place])   . Diabetes mellitus   . GERD (gastroesophageal reflux disease)   . Goals of care, counseling/discussion 02/28/2020  . High cholesterol   . Hypercalcemia of malignancy 03/14/2020  . Hypertension   . Iron deficiency anemia due to chronic blood loss 02/29/2020  . Kidney cancer, primary, with metastasis from kidney to other site, left ([place]) 03/14/2020  . Lymphoma ([place])   . 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 Malignant neoplasm of kidney metastatic to lymph nodes of multiple regions ([place]) 03/14/2020  . Stroke [facility])     Past Surgical History: Past Surgical History:  Procedure Laterality Date  . APPENDECTOMY    . IR  IMAGING GUIDED PORT INSERTION  03/20/2020  . IR US GUIDE BX ASP/DRAIN  03/08/2020  . PACEMAKER INSERTION Left 08/31/2012   SJM model Accent RF PM2210 (SN [id number]) dual chamber pacemaker implanted by Dr Jonetta Osgood for complete heart block  . PROSTATE SURGERY    . TONSILLECTOMY      Current Medications: Current Meds  Medication Sig  . alfuzosin (UROXATRAL) 10 MG 24 hr tablet Take 10 mg by mouth daily with breakfast.   . cabozantinib (CABOMETYX) 40 MG tablet Take 1 tablet (40 mg total) by mouth daily. Take on an empty stomach, 1 hour before or 2 hours after meals.  . Calcium-Magnesium-Vitamin D (CALCIUM 1200+D3 PO) Take 1 tablet by mouth  daily.  . carvedilol (COREG) 25 MG tablet Take 25 mg by mouth 2 (two) times daily with a meal.   . cetirizine (ZYRTEC) 10 MG tablet Take 10 mg by mouth daily.  . Cholecalciferol (VITAMIN D-1000 MAX ST) 25 MCG (1000 UT) tablet Take 1 capsule by mouth daily.  . Cyanocobalamin 5000 MCG SUBL Place 5,000 mcg under the tongue daily. Place one dose under the tongue daily   . Dulaglutide (TRULICITY) 3.33 LK/5.6YB SOPN Inject 0.75 mg into the skin every Wednesday.   . escitalopram (LEXAPRO) 10 MG tablet Take 10 mg by mouth daily.   . fenofibrate 160 MG tablet Take 160 mg by mouth daily.  . fentaNYL (DURAGESIC) 25 MCG/HR Place 1 patch onto the skin every 3 (three) days.  Marland Kitchen gabapentin (NEURONTIN) 100 MG capsule Take 100 mg by mouth at bedtime as needed (pain).   Marland Kitchen glipiZIDE (GLUCOTROL) 10 MG tablet Take 10 mg by mouth daily before breakfast. Take with glipizide 5 mg to equal 15 mg  . glipiZIDE (GLUCOTROL) 5 MG tablet Take 5 mg by mouth daily before breakfast. Take with glipizide 10 mg to equal 15 mg  . Glucosamine-Chondroitin 250-200 MG TABS Take 1 tablet by mouth 2 (two) times daily.  Marland Kitchen HYDROcodone-acetaminophen (NORCO) 7.5-325 MG tablet Take 1 tablet by mouth every 6 (six) hours as needed for moderate pain.  . memantine (NAMENDA) 10 MG tablet Take 10 mg by mouth 2 (two) times daily.   . metFORMIN (GLUCOPHAGE) 1000 MG tablet Take 1,000 mg by mouth 2 (two) times daily with a meal.   . Multiple Vitamin (MULTIVITAMIN) tablet Take 1 tablet by mouth daily.  . Omega-3 Fatty Acids (FISH OIL PO) Take 2 capsules by mouth daily.  Glory Rosebush VERIO test strip 1 each daily.  . rosuvastatin (CRESTOR) 20 MG tablet Take 20 mg by mouth daily.  . sacubitril-valsartan (ENTRESTO) 49-51 MG Take 1 tablet by mouth 2 (two) times daily.  Marland Kitchen selenium 50 MCG TABS Take 50 mcg by mouth daily.  . [DISCONTINUED] prochlorperazine (COMPAZINE) 10 MG tablet Take 1 tablet (10 mg total) by mouth every 6 (six) hours as needed (Nausea or  vomiting).     Allergies:    Patient has no known allergies.   Social History: Social History   Socioeconomic History  . Marital status: Married    Spouse name: Not on file  . Number of children: Not on file  . Years of education: Not on file  . Highest education level: Not on file  Occupational History    Comment: Retired Engineer, maintenance (IT)  Tobacco Use  . Smoking status: Former Research scientist (life sciences)  . Smokeless tobacco: Never Used  Vaping Use  . Vaping Use: Never used  Substance and Sexual Activity  . Alcohol use: Yes    Comment:  occ  . Drug use: No  . Sexual activity: Not on file  Other Topics Concern  . Not on file  Social History Narrative  . Not on file   Social Determinants of Health   Financial Resource Strain: Low Risk   . Difficulty of Paying Living Expenses: Not hard at all  Food Insecurity: No Food Insecurity  . Worried About Charity fundraiser in the Last Year: Never true  . Ran Out of Food in the Last Year: Never true  Transportation Needs: No Transportation Needs  . Lack of Transportation (Medical): No  . Lack of Transportation (Non-Medical): No  Physical Activity: Inactive  . Days of Exercise per Week: 0 days  . Minutes of Exercise per Session: 0 min  Stress: No Stress Concern Present  . Feeling of Stress : Only a little  Social Connections: Unknown  . Frequency of Communication with Friends and Family: More than three times a week  . Frequency of Social Gatherings with Friends and Family: Not on file  . Attends Religious Services: Not on file  . Active Member of Clubs or Organizations: Not on file  . Attends Archivist Meetings: Not on file  . Marital Status: Married     Family History: The patient's family history includes Breast cancer in his daughter; Cancer in his daughter and father; Cancer - Prostate in his father; Cataracts in his father, maternal grandfather, maternal grandmother, mother, paternal grandfather, and paternal grandmother. There is no  history of Other, Anxiety disorder, Ataxia, Chorea, Dementia, Depression, Diabetes, Headache, Heart disease, Intellectual disability, Multiple sclerosis, Neurofibromatosis, Neuropathy, Parkinsonism, Seizures, or Stroke.  ROS:   All other ROS reviewed and negative. Pertinent positives noted in the HPI.     EKGs/Labs/Other Studies Reviewed:   The following studies were personally reviewed by me today:  TTE 03/17/2020 1. Definity used; anteroseptal and apical akinesis with overall moderate  LV dysfunction; grade 1 diastolic dysfunction; mild LVH; mild AS (mean  gradient 11 mmHg); mild MR; moderate LAE.  2. Left ventricular ejection fraction, by estimation, is 35 to 40%. The  left ventricle has moderately decreased function. The left ventricle  demonstrates regional wall motion abnormalities (see scoring  diagram/findings for description). There is mild  left ventricular hypertrophy. Left ventricular diastolic parameters are  consistent with Grade I diastolic dysfunction (impaired relaxation).  Elevated left atrial pressure.  3. Right ventricular systolic function is normal. The right ventricular  size is normal. There is moderately elevated pulmonary artery systolic  pressure.  4. Left atrial size was moderately dilated.  5. The mitral valve is normal in structure. Mild mitral valve  regurgitation. No evidence of mitral stenosis.  6. The aortic valve is tricuspid. Aortic valve regurgitation is not  visualized. Mild aortic valve stenosis.  7. The inferior vena cava is dilated in size with <50% respiratory  variability, suggesting right atrial pressure of 15 mmHg.   Recent Labs: 03/16/2020: B Natriuretic Peptide 512.5 03/17/2020: Magnesium 2.0 04/11/2020: TSH 3.065 04/19/2020: ALT 36; BUN 25; Creatinine 0.95; Hemoglobin 11.8; Platelet Count 192; Potassium 3.7; Sodium 141   Recent Lipid Panel No results found for: CHOL, TRIG, HDL, CHOLHDL, VLDL, LDLCALC, LDLDIRECT  Physical Exam:     VS:  BP 135/70   Pulse 74   Ht 5\' 7"  (1.702 m)   Wt 165 lb 12.8 oz (75.2 kg)   SpO2 97%   BMI 25.97 kg/m    Wt Readings from Last 3 Encounters:  04/27/20 165 lb 12.8  oz (75.2 kg)  04/25/20 164 lb 1.6 oz (74.4 kg)  04/19/20 163 lb (73.9 kg)    General: Well nourished, well developed, in no acute distress Heart: Atraumatic, normal size  Eyes: PEERLA, EOMI  Neck: Supple, no JVD Endocrine: No thryomegaly Cardiac: Normal S1, S2; 2 out of 6 systolic ejection murmur Lungs: Clear to auscultation bilaterally, no wheezing, rhonchi or rales  Abd: Soft, nontender, no hepatomegaly  Ext: No edema, pulses 2+ Musculoskeletal: No deformities, BUE and BLE strength normal and equal Skin: Warm and dry, no rashes   Neuro: Alert and oriented to person, place, time, and situation, CNII-XII grossly intact, no focal deficits  Psych: Normal mood and affect   ASSESSMENT:   Kenneth Lyons is a 79 y.o. male who presents for the following: 1. Chronic systolic heart failure ([place])   2. Coronary artery disease involving native coronary artery of native heart without angina pectoris   3. Essential hypertension   4. Atrioventricular block, complete ([place])   5. Nonrheumatic aortic valve stenosis     PLAN:   1. Chronic systolic heart failure (HCC) -Ejection fraction 35 to 40%.  With wall motion abnormalities.  He was diagnosed 03/19/2020 after transfusions for renal cell cancer.  He was treated medically for non-STEMI but then developed profound anemia.  We opted for this medical therapy was heart failure at that time.  He never had any chest pain.  He was volume overloaded and diuresed aggressively.  Due to his anemia and concern for possible bleeding especially in the setting of metastatic renal cell cancer we have not pursued blood thinners or heart catheterization.  I recommended continue medical therapy at this time.  He is in no distress.  He has no chest pain he has no shortness of breath.  We will continue  Coreg 25 mg twice daily.  He will continue Entresto 49 to 51 mg twice daily.  He is on Lasix 40 mg daily. -We had an extensive discussion in the office today about his diagnosis and my recommendation for medical management.  I have not recommended aggressive options for him.  I think the main focus his symptoms.  He has no symptoms today.  His weights are stable.  He will continue current medications and he can pursue salty meals that did allow with his quality of life.  2. Coronary artery disease involving native coronary artery of native heart without angina pectoris -NSTEMI 03/19/2020 with heart failure and wall motion abnormalities.  He was unable to be treated aggressively due to ongoing anemia at that time.  His hemoglobin has recovered but still fluctuates.  He was started on aspirin 81 mg daily and is okay to continue.  Should he have any drops in his hemoglobin this should be stopped immediately.  He is on a beta-blocker.  He has no symptoms of angina.  Given his advanced cancer as well as dementia I recommended continue medical therapy.  He has no symptoms.  I did discuss with he and his wife today the focus should be on symptom management instead of aggressive measures.  They are in agreement.  3. Essential hypertension -Continue current medications.  Some of his blood pressure is likely to be related to his chemotherapy agent.  He is stable on his current dose of Coreg as well as Entresto.  4. Atrioventricular block, complete (HCC) -Followed by EP.  5. Nonrheumatic aortic valve stenosis -2/6 ejection murmur.  Mild aortic stenosis.  We will continue to monitor this every 3  to 5 years on echo.   Disposition: Return in about 3 months (around 07/28/2020).  Medication Adjustments/Labs and Tests Ordered: Current medicines are reviewed at length with the patient today.  Concerns regarding medicines are outlined above.  No orders of the defined types were placed in this encounter.  No orders  of the defined types were placed in this encounter.   Patient Instructions  Medication Instructions:  The current medical regimen is effective;  continue present plan and medications.  *If you need a refill on your cardiac medications before your next appointment, please call your pharmacy*   Follow-Up: At [facility], you and your health needs are our priority.  As part of our continuing mission to provide you with exceptional heart care, we have created designated Provider Care Teams.  These Care Teams include your primary Cardiologist (physician) and Advanced Practice Providers (APPs -  Physician Assistants and Nurse Practitioners) who all work together to provide you with the care you need, when you need it.  We recommend signing up for the patient portal called "MyChart".  Sign up information is provided on this After Visit Summary.  MyChart is used to connect with patients for Virtual Visits (Telemedicine).  Patients are able to view lab/test results, encounter notes, upcoming appointments, etc.  Non-urgent messages can be sent to your provider as well.   To learn more about what you can do with MyChart, go to [email or web address].    Your next appointment:   3 month(s)  The format for your next appointment:   In Person  Provider:   Eleonore Chiquito, MD        Time Spent with Patient: I have spent a total of 25 minutes with patient reviewing hospital notes, telemetry, EKGs, labs and examining the patient as well as establishing an assessment and plan that was discussed with the patient.  > 50% of time was spent in direct patient care.  Signed, Addison Naegeli. Audie Box, [place]  [address] [place],  [address] [phone]  04/27/2020 10:58 AM

## 2020-04-25 ENCOUNTER — Other Ambulatory Visit: Payer: Self-pay

## 2020-04-25 ENCOUNTER — Emergency Department (HOSPITAL_BASED_OUTPATIENT_CLINIC_OR_DEPARTMENT_OTHER): Payer: HMO

## 2020-04-25 ENCOUNTER — Emergency Department (HOSPITAL_BASED_OUTPATIENT_CLINIC_OR_DEPARTMENT_OTHER)
Admission: EM | Admit: 2020-04-25 | Discharge: 2020-04-25 | Disposition: A | Discharge status: Home / Self Care | Payer: HMO | Attending: Emergency Medicine | Admitting: Emergency Medicine

## 2020-04-25 ENCOUNTER — Other Ambulatory Visit: Payer: Self-pay | Admitting: *Deleted

## 2020-04-25 ENCOUNTER — Encounter (HOSPITAL_BASED_OUTPATIENT_CLINIC_OR_DEPARTMENT_OTHER): Payer: Self-pay

## 2020-04-25 DIAGNOSIS — Y999 Unspecified external cause status: Secondary | ICD-10-CM | POA: Diagnosis not present

## 2020-04-25 DIAGNOSIS — Y929 Unspecified place or not applicable: Secondary | ICD-10-CM | POA: Insufficient documentation

## 2020-04-25 DIAGNOSIS — S61012A Laceration without foreign body of left thumb without damage to nail, initial encounter: Secondary | ICD-10-CM | POA: Insufficient documentation

## 2020-04-25 DIAGNOSIS — F0280 Dementia in other diseases classified elsewhere without behavioral disturbance: Secondary | ICD-10-CM | POA: Diagnosis not present

## 2020-04-25 DIAGNOSIS — S81011A Laceration without foreign body, right knee, initial encounter: Secondary | ICD-10-CM | POA: Diagnosis not present

## 2020-04-25 DIAGNOSIS — T148XXA Other injury of unspecified body region, initial encounter: Secondary | ICD-10-CM

## 2020-04-25 DIAGNOSIS — I110 Hypertensive heart disease with heart failure: Secondary | ICD-10-CM | POA: Diagnosis not present

## 2020-04-25 DIAGNOSIS — W19XXXA Unspecified fall, initial encounter: Secondary | ICD-10-CM

## 2020-04-25 DIAGNOSIS — G301 Alzheimer's disease with late onset: Secondary | ICD-10-CM | POA: Diagnosis not present

## 2020-04-25 DIAGNOSIS — W101XXA Fall (on)(from) sidewalk curb, initial encounter: Secondary | ICD-10-CM | POA: Diagnosis not present

## 2020-04-25 DIAGNOSIS — C775 Secondary and unspecified malignant neoplasm of intrapelvic lymph nodes: Secondary | ICD-10-CM | POA: Insufficient documentation

## 2020-04-25 DIAGNOSIS — I509 Heart failure, unspecified: Secondary | ICD-10-CM | POA: Insufficient documentation

## 2020-04-25 DIAGNOSIS — C801 Malignant (primary) neoplasm, unspecified: Secondary | ICD-10-CM

## 2020-04-25 DIAGNOSIS — C8203 Follicular lymphoma grade I, intra-abdominal lymph nodes: Secondary | ICD-10-CM

## 2020-04-25 DIAGNOSIS — Y939 Activity, unspecified: Secondary | ICD-10-CM | POA: Insufficient documentation

## 2020-04-25 DIAGNOSIS — S63602A Unspecified sprain of left thumb, initial encounter: Secondary | ICD-10-CM | POA: Diagnosis not present

## 2020-04-25 DIAGNOSIS — S51011A Laceration without foreign body of right elbow, initial encounter: Secondary | ICD-10-CM | POA: Insufficient documentation

## 2020-04-25 DIAGNOSIS — E119 Type 2 diabetes mellitus without complications: Secondary | ICD-10-CM | POA: Diagnosis not present

## 2020-04-25 DIAGNOSIS — R519 Headache, unspecified: Secondary | ICD-10-CM | POA: Diagnosis not present

## 2020-04-25 DIAGNOSIS — Z23 Encounter for immunization: Secondary | ICD-10-CM | POA: Insufficient documentation

## 2020-04-25 DIAGNOSIS — S59901A Unspecified injury of right elbow, initial encounter: Secondary | ICD-10-CM | POA: Diagnosis present

## 2020-04-25 DIAGNOSIS — C642 Malignant neoplasm of left kidney, except renal pelvis: Secondary | ICD-10-CM

## 2020-04-25 MED ORDER — TETANUS-DIPHTH-ACELL PERTUSSIS 5-2.5-18.5 LF-MCG/0.5 IM SUSP
0.5000 mL | Freq: Once | INTRAMUSCULAR | Status: AC
  Administered 2020-04-25: 0.5 mL via INTRAMUSCULAR
  Filled 2020-04-25: qty 0.5

## 2020-04-25 MED ORDER — FENTANYL 25 MCG/HR TD PT72: 1.0000 | MEDICATED_PATCH | TRANSDERMAL | 0 refills | Status: DC

## 2020-04-25 NOTE — ED Provider Notes (Signed)
[facility] EMERGENCY DEPARTMENT Provider Note   CSN: [id number] Arrival date & time: 04/25/20  1120     History Chief Complaint  Patient presents with  . Fall    Kenneth Lyons is a 79 y.o. male brought in daughter for evaluation after mechanical fall.  Patient tripped over a curb earlier.  He hit his head but did not lose consciousness.  He suffered several skin tears to his left thumb, right elbow and right knee.  He has no other complaints.  HPI     Past Medical History:  Diagnosis Date  . Alzheimer's dementia ([place])   . Anemia associated with left renal cell cancer treated with erythropoietin ([place]) 03/14/2020  . Cancer ([place])   . Diabetes mellitus   . GERD (gastroesophageal reflux disease)   . Goals of care, counseling/discussion 02/28/2020  . High cholesterol   . Hypercalcemia of malignancy 03/14/2020  . Hypertension   . Iron deficiency anemia due to chronic blood loss 02/29/2020  . Kidney cancer, primary, with metastasis from kidney to other site, left ([place]) 03/14/2020  . Lymphoma ([place])   . 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 Malignant neoplasm of kidney metastatic to lymph nodes of multiple regions ([place]) 03/14/2020  . Stroke [facility])     Patient Active Problem List   Diagnosis Date Noted  . Acute on chronic systolic CHF (congestive heart failure), NYHA class 3 ([place])   . Pressure injury of skin 03/18/2020  . Acute respiratory failure with hypoxia ([place]) 03/17/2020  . Acute on chronic diastolic CHF (congestive heart failure) ([place]) 03/17/2020  . NSTEMI (non-ST elevated myocardial infarction) ([place]) 03/17/2020  . Sepsis with acute hypoxic respiratory failure without septic shock ([place]) 03/17/2020  . Mixed hyperlipidemia due to type 2 diabetes mellitus ([place]) 03/17/2020  . Kidney cancer, primary, with metastasis from kidney to other site, left ([place]) 03/14/2020  . Anemia associated with left renal cell cancer treated with erythropoietin ([place]) 03/14/2020   . Malignant neoplasm of kidney metastatic to lymph nodes of multiple regions ([place]) 03/14/2020  . Hypercalcemia of malignancy 03/14/2020  . Iron deficiency anemia due to chronic blood loss 02/29/2020  . Goals of care, counseling/discussion 02/28/2020  . Stroke ([place]) 10/20/2019  . Diabetes mellitus ([place]) 10/20/2019  . Dementia of the Alzheimer's type with late onset without behavioral disturbance ([place]) 01/03/2019  . Type 2 diabetes mellitus without retinopathy ([place]) 08/05/2018  . Tricuspid regurgitation 05/19/2018  . Hyperplastic rectal polyp 02/22/2018  . Dermatochalasis of both upper eyelids 08/04/2017  . Epiretinal membrane (ERM) of right eye 08/04/2017  . Keratoconjunctivitis sicca of both eyes not specified as Sjogren's 08/04/2017  . Pinguecula, left eye 08/04/2017  . Presbyopia of both eyes 08/04/2017  . Vitreous floater, bilateral 08/04/2017  . Dementia ([place]) 06/03/2016  . Controlled type 2 diabetes mellitus with microalbuminuria, without long-term current use of insulin ([place]) 04/16/2016  . Benign paroxysmal positional vertigo due to bilateral vestibular disorder 04/16/2016  . Pacemaker reprogramming/check 04/16/2016  . Essential hypertension 10/29/2015  . H/O malignant neoplasm of prostate 10/29/2015  . H/O: CVA (cerebrovascular accident) 10/29/2015  . Atrioventricular block, complete ([place]) 10/29/2015  . Carotid bruit 10/29/2015  . Cervical spondylosis without myelopathy 10/29/2015  . DDD (degenerative disc disease), lumbosacral 10/29/2015  . Diverticulosis of colon 10/29/2015  . Fredrickson type IIb or III hyperlipoproteinemia 10/29/2015  . Hearing loss 10/29/2015  . History of benign neoplasm of colon 10/29/2015  . Hypertriglyceridemia 10/29/2015  . Osteoarthritis, knee 10/29/2015  . Right  knee pain 10/29/2015  . Pseudophakia of both eyes 10/29/2015    Past Surgical History:  Procedure Laterality Date  . APPENDECTOMY    . IR IMAGING GUIDED PORT INSERTION  03/20/2020    . IR US GUIDE BX ASP/DRAIN  03/08/2020  . PACEMAKER INSERTION Left 08/31/2012   SJM model Accent RF PM2210 (SN [id number]) dual chamber pacemaker implanted by Dr Jonetta Osgood for complete heart block  . PROSTATE SURGERY    . TONSILLECTOMY         Family History  Problem Relation Age of Onset  . Breast cancer Daughter   . Cancer Daughter   . Cataracts Father   . Cancer - Prostate Father   . Cancer Father   . Cataracts Mother   . Cataracts Paternal Grandmother   . Cataracts Paternal Grandfather   . Cataracts Maternal Grandmother   . Cataracts Maternal Grandfather   . Other Neg Hx   . Anxiety disorder Neg Hx   . Ataxia Neg Hx   . Chorea Neg Hx   . Dementia Neg Hx   . Depression Neg Hx   . Diabetes Neg Hx   . Headache Neg Hx   . Heart disease Neg Hx   . Intellectual disability Neg Hx   . Multiple sclerosis Neg Hx   . Neurofibromatosis Neg Hx   . Neuropathy Neg Hx   . Parkinsonism Neg Hx   . Seizures Neg Hx   . Stroke Neg Hx     Social History   Tobacco Use  . Smoking status: Former Research scientist (life sciences)  . Smokeless tobacco: Never Used  Vaping Use  . Vaping Use: Never used  Substance Use Topics  . Alcohol use: Yes    Comment: occ  . Drug use: No    Home Medications Prior to Admission medications   Medication Sig Start Date End Date Taking? Authorizing Provider  alfuzosin (UROXATRAL) 10 MG 24 hr tablet Take 10 mg by mouth daily with breakfast.  08/18/19   [provider]  cabozantinib (CABOMETYX) 40 MG tablet Take 1 tablet (40 mg total) by mouth daily. Take on an empty stomach, 1 hour before or 2 hours after meals. 03/21/20   Volanda Napoleon, MD  Calcium-Magnesium-Vitamin D (CALCIUM 1200+D3 PO) Take 1 tablet by mouth daily. 08/31/18   [provider]  carvedilol (COREG) 25 MG tablet Take 25 mg by mouth 2 (two) times daily with a meal.  01/28/18   [provider]  cetirizine (ZYRTEC) 10 MG tablet Take 10 mg by mouth daily.    [provider]   Cholecalciferol (VITAMIN D-1000 MAX ST) 25 MCG (1000 UT) tablet Take 1 capsule by mouth daily.    [provider]  Cyanocobalamin 5000 MCG SUBL Place 5,000 mcg under the tongue daily. Place one dose under the tongue daily     [provider]  Dulaglutide (TRULICITY) 7.02 OV/7.8HY SOPN Inject 0.75 mg into the skin every Wednesday.     [provider]  escitalopram (LEXAPRO) 10 MG tablet Take 10 mg by mouth daily.  10/04/18   [provider]  fenofibrate 160 MG tablet Take 160 mg by mouth daily. 04/03/20   [provider]  fentaNYL (DURAGESIC) 25 MCG/HR Place 1 patch onto the skin every 3 (three) days. 03/14/20   Volanda Napoleon, MD  furosemide (LASIX) 40 MG tablet Take 1 tablet (40 mg total) by mouth daily. Patient taking differently: Take 20 mg by mouth daily.  03/20/20 04/19/20  Ghimire, [employer]  M, MD  gabapentin (NEURONTIN) 100 MG capsule Take 100 mg by mouth at bedtime as needed (pain).  02/24/20   [provider]  glipiZIDE (GLUCOTROL) 10 MG tablet Take 10 mg by mouth daily before breakfast. Take with glipizide 5 mg to equal 15 mg    [provider]  glipiZIDE (GLUCOTROL) 5 MG tablet Take 5 mg by mouth daily before breakfast. Take with glipizide 10 mg to equal 15 mg    [provider]  Glucosamine-Chondroitin 250-200 MG TABS Take 1 tablet by mouth 2 (two) times daily.    [provider]  HYDROcodone-acetaminophen (NORCO) 7.5-325 MG tablet Take 1 tablet by mouth every 6 (six) hours as needed for moderate pain. 03/14/20   Volanda Napoleon, MD  memantine (NAMENDA) 10 MG tablet Take 10 mg by mouth 2 (two) times daily.  05/12/17   [provider]  metFORMIN (GLUCOPHAGE) 1000 MG tablet Take 1,000 mg by mouth 2 (two) times daily with a meal.  01/28/18   [provider]  Multiple Vitamin (MULTIVITAMIN) tablet Take 1 tablet by mouth daily.    [provider]  Omega-3 Fatty Acids (FISH OIL PO) Take 2  capsules by mouth daily.    [provider]  [facility] VERIO test strip 1 each daily. 04/01/20   [provider]  rosuvastatin (CRESTOR) 20 MG tablet Take 20 mg by mouth daily. 08/17/19   [provider]  sacubitril-valsartan (ENTRESTO) 49-51 MG Take 1 tablet by mouth 2 (two) times daily. 04/11/20   O'NealCassie Freer, MD  selenium 50 MCG TABS Take 50 mcg by mouth daily.    [provider]  prochlorperazine (COMPAZINE) 10 MG tablet Take 1 tablet (10 mg total) by mouth every 6 (six) hours as needed (Nausea or vomiting). 03/20/20 03/22/20  Volanda Napoleon, MD    Allergies    Patient has no known allergies.  Review of Systems   Review of Systems Ten systems reviewed and are negative for acute change, except as noted in the HPI.   Physical Exam Updated Vital Signs BP (!) 145/64 (BP Location: Left Arm)   Pulse 72   Temp 98 F (36.7 C) (Oral)   Resp 20   Ht 5\' 7"  (1.702 m)   Wt 74.4 kg   SpO2 98%   BMI 25.70 kg/m   Physical Exam Vitals and nursing note reviewed.  Constitutional:      General: He is not in acute distress.    Appearance: He is well-developed. He is not diaphoretic.  HENT:     Head: Normocephalic.     Comments:   No obvious head trauma Eyes:     General: No scleral icterus.    Conjunctiva/sclera: Conjunctivae normal.  Cardiovascular:     Rate and Rhythm: Normal rate and regular rhythm.     Heart sounds: Normal heart sounds.  Pulmonary:     Effort: Pulmonary effort is normal. No respiratory distress.     Breath sounds: Normal breath sounds.  Abdominal:     Palpations: Abdomen is soft.     Tenderness: There is no abdominal tenderness.  Musculoskeletal:     Cervical back: Normal range of motion and neck supple.  Skin:    General: Skin is warm and dry.     Comments: Multiple skin tears as described in the HPI, no contamination or active bleeding  Neurological:     Mental Status: He is alert.  Psychiatric:        Behavior:  Behavior normal.     ED Results / Procedures / Treatments   Labs (all labs ordered are listed, but only abnormal results are displayed) Labs Reviewed - No data to display  EKG None  Radiology CT Head Wo Contrast  Result Date: 04/25/2020 CLINICAL DATA:  Fall, head injury, headache EXAM: CT HEAD WITHOUT CONTRAST TECHNIQUE: Contiguous axial images were obtained from the base of the skull through the vertex without intravenous contrast. COMPARISON:  07/26/2019, 07/15/2016 FINDINGS: Brain: There is normal anatomic configuration of the brain. Moderate parenchymal volume loss and mild ventriculomegaly are stable since prior examination. Confluent periventricular white matter changes are present, similar to that noted on prior examination, likely reflecting the sequela of small vessel ischemia. No evidence of acute intracranial hemorrhage or infarct. No abnormal intra or extra-axial mass lesion or fluid collection. No abnormal mass effect or midline shift. The cerebellum is unremarkable. Vascular: Age appropriate Skull: Intact Sinuses/Orbits: Visualized paranasal sinuses are clear. Orbits unremarkable. Other: Mastoid air cells and middle ear cavities are clear. IMPRESSION: No evidence of acute intracranial injury or calvarial fracture. Stable mild ventriculomegaly. Electronically Signed   By: Fidela Salisbury MD   On: 04/25/2020 14:02    Procedures Procedures (including critical care time)  Medications Ordered in ED Medications  Tdap (BOOSTRIX) injection 0.5 mL (0.5 mLs Intramuscular Given 04/25/20 1339)    ED Course  I have reviewed the triage vital signs and the nursing notes.  Pertinent labs & imaging results that were available during my care of the patient were reviewed by me and considered in my medical decision making (see chart for details).    MDM Rules/Calculators/A&P                          Patient here after mechanical fall.  I have personally reviewed and ordered imaging of the  head via CT which is negative for acute abnormality.  Patient wounds were cleansed and bandaged.  Tdap updated.  He appears appropriate for discharge with outpatient follow-up.  Wound care handout given. Final Clinical Impression(s) / ED Diagnoses Final diagnoses:  None    Rx / DC Orders ED Discharge Orders    None       Margarita Mail, PA-C 04/25/20 1425    Truddie Hidden, MD 04/25/20 1455

## 2020-04-25 NOTE — ED Triage Notes (Addendum)
Pt reports tripped/fell ~1 hour PTA-pain to right UE, right LE and top of head and left thumb-no LOC-slow gait-wife with pt

## 2020-04-25 NOTE — ED Notes (Signed)
Patient transported to CT 

## 2020-04-25 NOTE — Discharge Instructions (Addendum)
Your CT imaging was negative.  Please follow wound care instructions attached to this paper.  Return for any new or worsening symptoms.  Contact a health care provider if: You received a tetanus shot and you have swelling, severe pain, redness, or bleeding at the injection site. Your pain is not controlled with medicine. You have redness, swelling, or pain around the wound. You have fluid or blood coming from the wound. Your wound feels warm to the touch. You have pus or a bad smell coming from the wound. You have a fever or chills. You are nauseous or you vomit. You are dizzy. Get help right away if: You have a red streak going away from your wound. The edges of the wound open up and separate. Your wound is bleeding, and the bleeding does not stop with gentle pressure. You have a rash. You faint. You have trouble breathing.

## 2020-04-26 ENCOUNTER — Inpatient Hospital Stay: Payer: HMO | Admitting: General Practice

## 2020-04-26 DIAGNOSIS — C642 Malignant neoplasm of left kidney, except renal pelvis: Secondary | ICD-10-CM

## 2020-04-26 NOTE — Progress Notes (Signed)
[place] CSW Progress Notes  Brief check in call with wife - she is awaiting arrival of daughter for visit.  Requests call next week to further discuss caregiving support/resources.  Also requested that [facility] call again to offer caregiver support resources available through their agency.  Referral placed through [facility].  Edwyna Shell, LCSW Clinical Social Worker Phone:  [phone] Cell:  [phone]

## 2020-04-27 ENCOUNTER — Encounter: Payer: Self-pay | Admitting: Cardiovascular Disease

## 2020-04-27 ENCOUNTER — Other Ambulatory Visit: Payer: Self-pay

## 2020-04-27 ENCOUNTER — Ambulatory Visit: Payer: HMO | Admitting: Cardiovascular Disease

## 2020-04-27 VITALS — BP 135/70 | HR 74 | Ht 67.0 in | Wt 165.8 lb

## 2020-04-27 DIAGNOSIS — I350 Nonrheumatic aortic (valve) stenosis: Secondary | ICD-10-CM

## 2020-04-27 DIAGNOSIS — I2510 Atherosclerotic heart disease of native coronary artery without angina pectoris: Secondary | ICD-10-CM

## 2020-04-27 DIAGNOSIS — I442 Atrioventricular block, complete: Secondary | ICD-10-CM | POA: Diagnosis not present

## 2020-04-27 DIAGNOSIS — I10 Essential (primary) hypertension: Secondary | ICD-10-CM

## 2020-04-27 DIAGNOSIS — I5022 Chronic systolic (congestive) heart failure: Secondary | ICD-10-CM | POA: Diagnosis not present

## 2020-04-27 NOTE — Patient Instructions (Signed)
Medication Instructions:  The current medical regimen is effective;  continue present plan and medications.  *If you need a refill on your cardiac medications before your next appointment, please call your pharmacy*   Follow-Up: At [facility], you and your health needs are our priority.  As part of our continuing mission to provide you with exceptional heart care, we have created designated Provider Care Teams.  These Care Teams include your primary Cardiologist (physician) and Advanced Practice Providers (APPs -  Physician Assistants and Nurse Practitioners) who all work together to provide you with the care you need, when you need it.  We recommend signing up for the patient portal called "MyChart".  Sign up information is provided on this After Visit Summary.  MyChart is used to connect with patients for Virtual Visits (Telemedicine).  Patients are able to view lab/test results, encounter notes, upcoming appointments, etc.  Non-urgent messages can be sent to your provider as well.   To learn more about what you can do with MyChart, go to [email or web address].    Your next appointment:   3 month(s)  The format for your next appointment:   In Person  Provider:   Real O'Neal, MD     

## 2020-05-02 ENCOUNTER — Other Ambulatory Visit: Payer: HMO

## 2020-05-02 ENCOUNTER — Telehealth: Payer: Self-pay | Admitting: General Practice

## 2020-05-03 ENCOUNTER — Inpatient Hospital Stay: Payer: HMO | Admitting: General Practice

## 2020-05-03 DIAGNOSIS — C642 Malignant neoplasm of left kidney, except renal pelvis: Secondary | ICD-10-CM

## 2020-05-03 NOTE — Telephone Encounter (Signed)
This encounter was created in error - please disregard.

## 2020-05-03 NOTE — Progress Notes (Signed)
[place] CSW Progress Notes  Call to wife to discuss caregiver support.  She is primary caregiver for patient who suffers from cancer and Alzheimers dementia.  Per wife, his cancer treatments are not causing much stress.  However, his ability to care for himself has declined significantly since his diagnosis of dementia four years ago.  He is resistant to letting her go out and does not want other caregivers to come in to give wife respite.  He will accept his daughters and select neighbors/friends who have indicated they are willing to come in and stay w him while wife gets out for a break.  Discussed the significant risk of caregiver burnout and encouraged wife to prioritize giving herself regular breaks.  She identifies activities of interest like hitting a golf ball, window shopping or having lunch w friends as things she would like to do when she does take a caregiver break.  She was given the number for the [place] has caregiver support materials as well as information on private pay in home care aide options.  Wife is also in contact w dementia support resource at [employer] [facility] [place].  Wife encouraged to view caregiving as a long term process, one where regular breaks and self care are necessary in order to be able to maintain one's ability to continue to care for a loved one throughout their illness.  Packet of information on [place] mailed to wife and she was encouraged to connect as needed.  Edwyna Shell, LCSW Clinical Social Worker Phone:  [phone] Cell:  [phone]

## 2020-05-07 ENCOUNTER — Other Ambulatory Visit: Payer: Self-pay | Admitting: *Deleted

## 2020-05-07 DIAGNOSIS — C8203 Follicular lymphoma grade I, intra-abdominal lymph nodes: Secondary | ICD-10-CM

## 2020-05-07 DIAGNOSIS — C642 Malignant neoplasm of left kidney, except renal pelvis: Secondary | ICD-10-CM

## 2020-05-07 DIAGNOSIS — C801 Malignant (primary) neoplasm, unspecified: Secondary | ICD-10-CM

## 2020-05-07 MED ORDER — FENTANYL 25 MCG/HR TD PT72: 1.0000 | MEDICATED_PATCH | TRANSDERMAL | 0 refills | Status: DC

## 2020-05-08 ENCOUNTER — Other Ambulatory Visit: Payer: Self-pay

## 2020-05-08 ENCOUNTER — Ambulatory Visit: Payer: Self-pay

## 2020-05-08 NOTE — Patient Outreach (Signed)
[place] [facility]) Care Management Chronic Special Needs Program  05/08/2020  Name: Kenneth Lyons DOB: December 29, 1940  MRN: [id number]  Kenneth Lyons is enrolled in a chronic special needs plan for Diabetes. Telephone call to client's wife, Darric Plante for  CSNP assessment follow up. Wife states client is weak and not as motivated to do his exercises. She reports client's physical therapy has been discontinued.  Wife reports client sustained a fall on 04/25/20 without severe injury. She reports client miss stepped on a curb. Wife states client's blood sugar this am was 66. She reports client will have a low blood sugar occasionally. Wife states client's primary care provider is on maternity leave.   RN advised wife to contact client's primary care provider office to notify covering provider of ongoing low blood sugars Wife verbalized understanding.  Wife reports client continues with treatment plan for lymphoma as advised. She reports Dr. Jonette Eva would like client to have a repeat PET scan after next weeks treatment.  Wife reports client is receiving follow up with oncology social worker and resource options with [facility].    Client on [facility] list ( Not engaged)   Goals Addressed            This Visit's Progress   . COMPLETED: caregiver will report client expresses decrease pain within 3 months as evidenced by an established treatment plan for his lymphoma and pain management   On track    Wife reports client verbalizes decrease in pain due to using fentanyl patch.  Wife reports client is currently on treatment plan for lymphoma and has consistent follow up with oncology doctor.       . COMPLETED: Caregiver will report client follow up with doctor for treatment plan regarding lymphoma diagnosis within 3 months.   On track    Wife reports client followus up regularly with oncologist.  Oncology follow up visits completed on 04/19/20, 04/11/20, 03/14/20, and 02/28/20    .  Caregiver will report notifying primary care provider of client's low blood sugar readings (36 and 50's) within 3 months.   On track    [place] to be re-evaluated in 3 months.   Discussed with wife importance of notifying provider of client's low blood sugar readings.  Reviewed Rule of 15 reviewed with caregiver:  STEP  1:  Take 15 grams of carbohydrates when your blood sugar is low, which includes:   3-4 glucose tabs or  3-4 oz of juice or regular soda or  One tube of glucose gel STEP 2:  Recheck blood sugar in 15 minutes STEP 3:  If your blood sugar is still low at the 15 minute recheck ---then, go back to STEP 1 and treat again with another 15 grams of carbohydrates Assured caregiver aware to call 911 for severe hypoglycemic symptoms.    . Client understands the importance of follow-up with providers by attending scheduled visits   On track    Most recent primary care provider visit: 02/24/20, 02/14/20, 12/13/19 Oncology follow up visits completed on 04/19/20, 04/11/20, 03/14/20, and 02/28/20 Continue follow up with oncology social worker as needed Contact [facility] as needed for client care/ resources.  Continue to maintain and keep follow up visits with your providers.     . Client will not report change from baseline and no repeated symptoms of stroke with in the next 3 months       Wife reports no change in clients baseline status or repeated symptoms of stroke.  RN  case manager re-discussed stroke symptoms with caregiver for client     . Client will report no fall or injuries in the next 3 months.   Not on track    Caregiver reports client had 1 fall without severe injury  RN case manager will send client education article: Preventing falls.     . Client will verbalize knowledge of self management of Hypertension as evidences by BP reading of 140/90 or less; or as defined by provider   No change    Reviewed blood pressure targets with wife/ caregiver:  Recent blood pressure readings for  client 161/85, 164/89, 178/77  Continue to take your blood pressure medication as prescribed.  Contact your doctor if you experience symptoms or have concerns regarding your blood pressure.  RN case manager will send client education article: HIgh Blood pressure in adults      . Client/Caregiver will verbalize understanding of instructions related to self-care and safety   Not on track    Wife reports client has sustained a fall without severe injury  RN case manager discussed fall prevention/ safety:  . Inside your home: Don't use throw rugs, use plenty of lighting, keep walkways clear of clutter, and remove tripping hazards such as cords. Be aware of small pets when walking. . For outside safety:  Slow down and look ahead, avoid problem areas avoid uneven or slippery pavement, watch out for slippery flooring, especially polished surfaces, wear proper non-rubber soled shoes, rather than flip flops or high heels and carry a cell phone and/ or medical alert device with you.  Continue to follow up with your doctor for worsening symptoms or noticeable side effects from medication.     . COMPLETED: General - Client will not be readmitted within 30 days (C-SNP)       Wife reports no hospital readmission for client.     Marland Kitchen HEMOGLOBIN A1C < 7       Most recent Hgb A1c:  12/13/19 7.0,  03/17/20 was 6.5% Re-Discussed client's recent hypoglycemic events with caregiver. Advised caregiver to notify clients doctor of recent hypoglycemic events.  Continue diabetes self management actions:  Glucose monitoring per provider recommendations  Perform Quality checks on blood meter  Eat Healthy  Check feet daily  Visit provider every 3-6 months as directed  Hbg A1C level every 3-6 months.  Eye Exam yearly    . Maintain timely refills of diabetic medication as prescribed within the year .   On track    Wife reports client receives timely refills of his medications.  Medication review from medical  record completed with client's wife Continue to take your medications as prescribed.        . COMPLETED: Obtain annual  Lipid Profile, LDL-C       Most recent lipid profile 12/13/19      . Obtain Annual Eye (retinal)  Exam    On track    Your last documented eye exam was 08/11/19 Wife reports client's next scheduled eye exam is October 2021.      Marland Kitchen COMPLETED: Obtain Annual Foot Exam       Per primary care provider office most recent foot exam 02/24/20       . Obtain annual screen for micro albuminuria (urine) , nephropathy (kidney problems)   Not on track    Confirmed with primary care provider office most recent microalbuminuria 03/03/15 Reviewed with wife:  Diabetes can affect your kidneys. It is important for your doctor to check your  urine at least once a year.  These tests show how your kidney's are working.      . Obtain Hemoglobin A1C at least 2 times per year   On track    Hgb A1C done on  12/13/19 results 7.0% and 03/12/20 results 6.5% Continue to follow diabetes self management actions:  Glucose monitoring per provider recommendation  Visit provider every 3-6 months as directed  Hbg A1C level every 3-6 months.  Carbohydrate controlled meal planning  Taking diabetes medication as prescribed by provider  Physical activity       . COMPLETED: Visit Primary Care Provider or Endocrinologist at least 2 times per year        Most recent primary care provider visit: 02/24/20, 02/14/20, 12/13/19     . Wife wil report knowledge of hypoglycemic symptoms and management in 3 months.       RN case manager will send client education article: Low blood sugar in people with diabetes.  Please follow the RULE OF 15 for the treatment of hypoglycemia treatment (When your blood sugars are less than 70 mg/ dl) STEP  1:  Take 15 grams of carbohydrates when your blood sugar is low, which includes:   3-4 glucose tabs or  3-4 oz of juice or regular soda or  One tube of glucose gel STEP 2:   Recheck blood sugar in 15 minutes STEP 3:  If your blood sugar is still low at the 15 minute recheck ---then, go back to STEP 1 and treat again with another 15 grams of carbohydrates    . wife will report client having repeat PET scan to determine prognosis in 3 months.       Follow up with your oncologist to schedule PET scan as recommended.  Contact your doctor if you have questions/ concerns..        Plan:  Send successful outreach letter with a copy of their individualized care plan, Send individual care plan to provider and Send educational material  Chronic care management coordinator will outreach in:  3 Months    Quinn Plowman RN,BSN,CCM [place] [facility] [phone]    .

## 2020-05-09 ENCOUNTER — Ambulatory Visit: Payer: HMO

## 2020-05-09 ENCOUNTER — Other Ambulatory Visit: Payer: HMO

## 2020-05-09 ENCOUNTER — Other Ambulatory Visit: Payer: Self-pay

## 2020-05-09 ENCOUNTER — Ambulatory Visit: Payer: HMO | Admitting: Hematology & Oncology

## 2020-05-09 NOTE — Patient Outreach (Signed)
  [place] [facility]) Care Management Chronic Special Needs Program   05/09/2020  Name: Kenneth Lyons, DOB: 28-Sep-1941  MRN: [id number]  The client was discussed in today's interdisciplinary care team meeting.  The following issues were discussed:  Client's needs, Changes in health status, Key risk triggers/risk stratification, Care Plan, Coordination of care and Issues/barriers to care  Participants present:   Bary Castilla, BSN, MS, CCM  Quinn Plowman, BSN, CCM   [employer], BSN, CCM, CDE  Thea Silversmith, BSN, MSN, CCM  [facility], CBCC/ CMAA  Dr. Gracy Bruins, RN, MSN-[facility]  Shary Key, Supervisor-[facility]  Karrie Meres, Pharm D HTA     Plan:  [facility] will continue to follow for care coordination needs and follow up with client as indicated.   Quinn Plowman RN,BSN,CCM [facility] Management [phone]

## 2020-05-17 ENCOUNTER — Telehealth: Payer: Self-pay | Admitting: Hematology & Oncology

## 2020-05-17 ENCOUNTER — Other Ambulatory Visit: Payer: Self-pay | Admitting: *Deleted

## 2020-05-17 ENCOUNTER — Encounter: Payer: Self-pay | Admitting: *Deleted

## 2020-05-17 ENCOUNTER — Inpatient Hospital Stay (HOSPITAL_BASED_OUTPATIENT_CLINIC_OR_DEPARTMENT_OTHER): Payer: HMO | Admitting: Hematology & Oncology

## 2020-05-17 ENCOUNTER — Telehealth: Payer: Self-pay | Admitting: *Deleted

## 2020-05-17 ENCOUNTER — Inpatient Hospital Stay: Payer: HMO

## 2020-05-17 ENCOUNTER — Other Ambulatory Visit: Payer: Self-pay

## 2020-05-17 VITALS — BP 115/64 | HR 74 | Temp 97.6°F | Resp 18 | Wt 160.3 lb

## 2020-05-17 DIAGNOSIS — D500 Iron deficiency anemia secondary to blood loss (chronic): Secondary | ICD-10-CM

## 2020-05-17 DIAGNOSIS — C649 Malignant neoplasm of unspecified kidney, except renal pelvis: Secondary | ICD-10-CM

## 2020-05-17 DIAGNOSIS — C801 Malignant (primary) neoplasm, unspecified: Secondary | ICD-10-CM

## 2020-05-17 DIAGNOSIS — E119 Type 2 diabetes mellitus without complications: Secondary | ICD-10-CM

## 2020-05-17 DIAGNOSIS — Z5111 Encounter for antineoplastic chemotherapy: Secondary | ICD-10-CM | POA: Diagnosis not present

## 2020-05-17 DIAGNOSIS — Z95828 Presence of other vascular implants and grafts: Secondary | ICD-10-CM

## 2020-05-17 DIAGNOSIS — C642 Malignant neoplasm of left kidney, except renal pelvis: Secondary | ICD-10-CM | POA: Diagnosis not present

## 2020-05-17 LAB — CBC WITH DIFFERENTIAL (CANCER CENTER ONLY)
Abs Immature Granulocytes: 0.03 10*3/uL (ref 0.00–0.07)
Basophils Absolute: 0 10*3/uL (ref 0.0–0.1)
Basophils Relative: 1 %
Eosinophils Absolute: 0.3 10*3/uL (ref 0.0–0.5)
Eosinophils Relative: 6 %
HCT: 37.1 % — ABNORMAL LOW (ref 39.0–52.0)
Hemoglobin: 11.7 g/dL — ABNORMAL LOW (ref 13.0–17.0)
Immature Granulocytes: 1 %
Lymphocytes Relative: 12 %
Lymphs Abs: 0.6 10*3/uL — ABNORMAL LOW (ref 0.7–4.0)
MCH: 26.5 pg (ref 26.0–34.0)
MCHC: 31.5 g/dL (ref 30.0–36.0)
MCV: 84.1 fL (ref 80.0–100.0)
Monocytes Absolute: 0.3 10*3/uL (ref 0.1–1.0)
Monocytes Relative: 6 %
Neutro Abs: 3.8 10*3/uL (ref 1.7–7.7)
Neutrophils Relative %: 74 %
Platelet Count: 209 10*3/uL (ref 150–400)
RBC: 4.41 MIL/uL (ref 4.22–5.81)
RDW: 24.2 % — ABNORMAL HIGH (ref 11.5–15.5)
WBC Count: 5.1 10*3/uL (ref 4.0–10.5)
nRBC: 0 % (ref 0.0–0.2)

## 2020-05-17 LAB — IRON AND TIBC
Iron: 80 ug/dL (ref 42–163)
Saturation Ratios: 23 % (ref 20–55)
TIBC: 348 ug/dL (ref 202–409)
UIBC: 268 ug/dL (ref 117–376)

## 2020-05-17 LAB — FERRITIN: Ferritin: 1378 ng/mL — ABNORMAL HIGH (ref 24–336)

## 2020-05-17 LAB — CMP (CANCER CENTER ONLY)
ALT: 51 U/L — ABNORMAL HIGH (ref 0–44)
AST: 55 U/L — ABNORMAL HIGH (ref 15–41)
Albumin: 3.7 g/dL (ref 3.5–5.0)
Alkaline Phosphatase: 62 U/L (ref 38–126)
Anion gap: 7 (ref 5–15)
BUN: 19 mg/dL (ref 8–23)
CO2: 29 mmol/L (ref 22–32)
Calcium: 9.8 mg/dL (ref 8.9–10.3)
Chloride: 103 mmol/L (ref 98–111)
Creatinine: 0.91 mg/dL (ref 0.61–1.24)
GFR, Est AFR Am: >60 mL/min (ref >60)
GFR, Estimated: >60 mL/min (ref >60)
Glucose, Bld: 198 mg/dL — ABNORMAL HIGH (ref 70–99)
Potassium: 4 mmol/L (ref 3.5–5.1)
Sodium: 139 mmol/L (ref 135–145)
Total Bilirubin: 0.3 mg/dL (ref 0.3–1.2)
Total Protein: 6.3 g/dL — ABNORMAL LOW (ref 6.5–8.1)

## 2020-05-17 LAB — TSH: TSH: 4.568 u[IU]/mL — ABNORMAL HIGH (ref 0.320–4.118)

## 2020-05-17 LAB — LACTATE DEHYDROGENASE: LDH: 402 U/L — ABNORMAL HIGH (ref 98–192)

## 2020-05-17 MED ORDER — LEVOTHYROXINE SODIUM 50 MCG PO TABS: 50.0000 ug | ORAL_TABLET | Freq: Every day | ORAL | 4 refills | Status: AC

## 2020-05-17 MED ORDER — SODIUM CHLORIDE 0.9% FLUSH
10.0000 mL | Freq: Once | INTRAVENOUS | Status: AC
  Administered 2020-05-17: 10 mL via INTRAVENOUS
  Filled 2020-05-17: qty 10

## 2020-05-17 MED ORDER — SODIUM CHLORIDE 0.9 % IV SOLN
Freq: Once | INTRAVENOUS | Status: AC
  Filled 2020-05-17: qty 250

## 2020-05-17 MED ORDER — HEPARIN SOD (PORK) LOCK FLUSH 100 UNIT/ML IV SOLN
500.0000 [IU] | Freq: Once | INTRAVENOUS | Status: AC | PRN
  Administered 2020-05-17: 500 [IU]
  Filled 2020-05-17: qty 5

## 2020-05-17 MED ORDER — ESCITALOPRAM OXALATE 20 MG PO TABS: 20.0000 mg | ORAL_TABLET | Freq: Every day | ORAL | 3 refills | Status: AC

## 2020-05-17 MED ORDER — SODIUM CHLORIDE 0.9 % IV SOLN
480.0000 mg | Freq: Once | INTRAVENOUS | Status: AC
  Administered 2020-05-17: 480 mg via INTRAVENOUS
  Filled 2020-05-17: qty 48

## 2020-05-17 MED ORDER — SODIUM CHLORIDE 0.9% FLUSH
10.0000 mL | INTRAVENOUS | Status: DC | PRN
  Administered 2020-05-17: 10 mL
  Filled 2020-05-17: qty 10

## 2020-05-17 NOTE — Progress Notes (Signed)
Hematology and Oncology Follow Up Visit  Kenneth Lyons [id number] 1941/06/18 79 y.o. 05/17/2020   Principle Diagnosis:   Metastatic renal cell carcinoma of the left kidney with multiple lymph node metastasis  Anemia secondary to renal cell carcinoma  Iron deficiency anemia secondary to malabsorption  Hypercalcemia secondary to renal cell carcinoma  Current Therapy:    Nivolumab/Cabometyx --s/p cycle 1  - started on 03/27/2020 - s/p cycle #2 of Nivolumab  Xgeva 120 mg subcu every 3 months -next dose on 06/2020  IV iron as indicated-dose given on 03/16/2020     Interim History:  Kenneth Lyons is back for follow-up.  Overall, he is about the same.  He has fallen a couple times.  He takes a Loss adjuster, chartered and falls.  He does not use his walker or cane.  I told him that he really needs to use these.  I told him that if he fell and broke his hip, that he would never recover from this.  I think he is tolerating treatment pretty well.  He is having no problems with the Cabometyx.  He is having no problems with the nivolumab.  There is no diarrhea.  He has had no rash.  There is no fever.  He has had no cough.  He does have significant cardiac issues.  He does have significant dementia.  The dementia certainly I think causes some issues.  His wife is trying to get him to be more active and he just will not do what she would like.  There has been no issues with congestive heart failure.  He has had no headache.   He has had no problems with pain.  He is on the Duragesic patch.  He has had no obvious rectal bleeding.  Overall, his performance status is probably ECOG 2.   Medications:  Current Outpatient Medications:  .  ASPIRIN 81 PO, Take 81 mg by mouth daily., Disp: , Rfl:  .  furosemide (LASIX) 20 MG tablet, Take 20 mg by mouth daily., Disp: , Rfl:  .  alfuzosin (UROXATRAL) 10 MG 24 hr tablet, Take 10 mg by mouth daily with breakfast. , Disp: , Rfl:  .  cabozantinib (CABOMETYX) 40 MG  tablet, Take 1 tablet (40 mg total) by mouth daily. Take on an empty stomach, 1 hour before or 2 hours after meals., Disp: 30 tablet, Rfl: 6 .  Calcium-Magnesium-Vitamin D (CALCIUM 1200+D3 PO), Take 1 tablet by mouth daily., Disp: , Rfl:  .  carvedilol (COREG) 25 MG tablet, Take 25 mg by mouth 2 (two) times daily with a meal. , Disp: , Rfl:  .  cetirizine (ZYRTEC) 10 MG tablet, Take 10 mg by mouth daily., Disp: , Rfl:  .  Cholecalciferol (VITAMIN D-1000 MAX ST) 25 MCG (1000 UT) tablet, Take 1 capsule by mouth daily., Disp: , Rfl:  .  Cyanocobalamin 5000 MCG SUBL, Place 5,000 mcg under the tongue daily. Place one dose under the tongue daily , Disp: , Rfl:  .  Dulaglutide (TRULICITY) 0.93 OI/7.1IW SOPN, Inject 0.75 mg into the skin every Wednesday. , Disp: , Rfl:  .  escitalopram (LEXAPRO) 10 MG tablet, Take 10 mg by mouth daily. , Disp: , Rfl:  .  fenofibrate 160 MG tablet, Take 160 mg by mouth daily., Disp: , Rfl:  .  fentaNYL (DURAGESIC) 25 MCG/HR, Place 1 patch onto the skin every 3 (three) days., Disp: 5 patch, Rfl: 0 .  gabapentin (NEURONTIN) 100 MG capsule, Take 100 mg by mouth at  bedtime as needed (pain). , Disp: , Rfl:  .  glipiZIDE (GLUCOTROL) 10 MG tablet, Take 10 mg by mouth daily before breakfast. Take with glipizide 5 mg to equal 15 mg, Disp: , Rfl:  .  glipiZIDE (GLUCOTROL) 5 MG tablet, Take 5 mg by mouth daily before breakfast. Take with glipizide 10 mg to equal 15 mg, Disp: , Rfl:  .  Glucosamine-Chondroitin 250-200 MG TABS, Take 1 tablet by mouth 2 (two) times daily., Disp: , Rfl:  .  HYDROcodone-acetaminophen (NORCO) 7.5-325 MG tablet, Take 1 tablet by mouth every 6 (six) hours as needed for moderate pain., Disp: 60 tablet, Rfl: 0 .  memantine (NAMENDA) 10 MG tablet, Take 10 mg by mouth 2 (two) times daily. , Disp: , Rfl:  .  metFORMIN (GLUCOPHAGE) 1000 MG tablet, Take 1,000 mg by mouth 2 (two) times daily with a meal. , Disp: , Rfl:  .  Multiple Vitamin (MULTIVITAMIN) tablet, Take  1 tablet by mouth daily., Disp: , Rfl:  .  Omega-3 Fatty Acids (FISH OIL PO), Take 2 capsules by mouth daily., Disp: , Rfl:  .  ONETOUCH VERIO test strip, 1 each daily., Disp: , Rfl:  .  rosuvastatin (CRESTOR) 20 MG tablet, Take 20 mg by mouth daily., Disp: , Rfl:  .  sacubitril-valsartan (ENTRESTO) 49-51 MG, Take 1 tablet by mouth 2 (two) times daily., Disp: 60 tablet, Rfl: 11 .  selenium 50 MCG TABS, Take 50 mcg by mouth daily., Disp: , Rfl:   Allergies: No Known Allergies  Past Medical History, Surgical history, Social history, and Family History were reviewed and updated.  Review of Systems: Review of Systems  Constitutional: Positive for appetite change, fatigue and unexpected weight change.  HENT:  Negative.   Eyes: Negative.   Respiratory: Positive for shortness of breath.   Cardiovascular: Positive for leg swelling.  Gastrointestinal: Positive for diarrhea and nausea.  Endocrine: Negative.   Genitourinary: Negative.    Musculoskeletal: Positive for back pain and neck pain.  Skin: Negative.   Neurological: Negative.   Hematological: Negative.   Psychiatric/Behavioral: Negative.     Physical Exam:  weight is 160 lb 5 oz (72.7 kg). His oral temperature is 97.6 F (36.4 C). His blood pressure is 115/64 (abnormal) and his pulse is 74. His respiration is 18 and oxygen saturation is 98%.   Wt Readings from Last 3 Encounters:  05/17/20 160 lb 5 oz (72.7 kg)  04/27/20 165 lb 12.8 oz (75.2 kg)  04/25/20 164 lb 1.6 oz (74.4 kg)    Physical Exam Vitals reviewed.  Constitutional:      Comments: This is a pale appearing elderly looking white male.  He is is in no obvious distress.  HENT:     Head: Normocephalic and atraumatic.  Eyes:     Pupils: Pupils are equal, round, and reactive to light.  Cardiovascular:     Rate and Rhythm: Normal rate and regular rhythm.     Heart sounds: Normal heart sounds.     Comments: Cardiac exam shows a regular rate and rhythm.  He has a 1/6  systolic ejection murmur. Pulmonary:     Effort: Pulmonary effort is normal.     Breath sounds: Normal breath sounds.  Abdominal:     General: Bowel sounds are normal.     Palpations: Abdomen is soft.  Musculoskeletal:        General: No tenderness or deformity. Normal range of motion.     Cervical back: Normal range of motion.  Comments: His extremities shows swelling of the left arm and left leg.  This is pitting edema in both upper and lower extremities.  He has some weakness in both arms and legs.  He has decent range of motion.  He has decent pulses in his distal extremities.  Lymphadenopathy:     Cervical: No cervical adenopathy.  Skin:    General: Skin is warm and dry.     Findings: No erythema or rash.  Neurological:     Mental Status: He is alert and oriented to person, place, and time.  Psychiatric:        Behavior: Behavior normal.        Thought Content: Thought content normal.        Judgment: Judgment normal.      Lab Results  Component Value Date   WBC 5.1 05/17/2020   HGB 11.7 (L) 05/17/2020   HCT 37.1 (L) 05/17/2020   MCV 84.1 05/17/2020   PLT 209 05/17/2020     Chemistry      Component Value Date/Time   NA 139 05/17/2020 1115   NA 139 04/10/2020 1157   K 4.0 05/17/2020 1115   CL 103 05/17/2020 1115   CO2 29 05/17/2020 1115   BUN 19 05/17/2020 1115   BUN 32 (H) 04/10/2020 1157   CREATININE 0.91 05/17/2020 1115      Component Value Date/Time   CALCIUM 9.8 05/17/2020 1115   ALKPHOS 62 05/17/2020 1115   AST 55 (H) 05/17/2020 1115   ALT 51 (H) 05/17/2020 1115   BILITOT 0.3 05/17/2020 1115      Impression and Plan: Mr. Lussier is a very nice 79 year old white male.  He has metastatic renal cell carcinoma.  Thankfully, the renal cell carcinoma is only in lymph nodes and not in the organs.  After this cycle of nivolumab, we will then plan for a follow-up PET scan on him.  This will give [place] a good idea as to how well he is responding.  Again I  have to believe he is responding well.  I just feel bad about the dementia.  I know this is a confounding factor with respect to trying to take care of him.  We will plan for another follow-up in 1 month.  We will get the PET scan in about 3 weeks.   Volanda Napoleon, MD 7/29/202112:46 PM

## 2020-05-17 NOTE — Patient Instructions (Signed)
[facility] Discharge Instructions for Patients Receiving Chemotherapy  Today you received the following chemotherapy agents Opdivo.  To help prevent nausea and vomiting after your treatment, we encourage you to take your nausea medication as directed.   If you develop nausea and vomiting that is not controlled by your nausea medication, call the clinic.   BELOW ARE SYMPTOMS THAT SHOULD BE REPORTED IMMEDIATELY:  *FEVER GREATER THAN 100.5 F  *CHILLS WITH OR WITHOUT FEVER  NAUSEA AND VOMITING THAT IS NOT CONTROLLED WITH YOUR NAUSEA MEDICATION  *UNUSUAL SHORTNESS OF BREATH  *UNUSUAL BRUISING OR BLEEDING  TENDERNESS IN MOUTH AND THROAT WITH OR WITHOUT PRESENCE OF ULCERS  *URINARY PROBLEMS  *BOWEL PROBLEMS  UNUSUAL RASH Items with * indicate a potential emergency and should be followed up as soon as possible.  Feel free to call the clinic you have any questions or concerns. The clinic phone number is [phone].  Please show the CHEMO ALERT CARD at check-in to the Emergency Department and triage nurse.    

## 2020-05-17 NOTE — Progress Notes (Signed)
Oncology Nurse Navigator Documentation  Oncology Nurse Navigator Flowsheets 05/17/2020  Abnormal Finding Date -  Diagnosis Status -  Planned Course of Treatment -  Phase of Treatment -  Chemotherapy Pending- Reason: -  Chemotherapy Actual Start Date: -  Navigator Follow Up Date: 06/15/2020  Navigator Follow Up Reason: Follow-up Appointment  Navigator Location [facility]  Referral Date to RadOnc/MedOnc -  Navigator Encounter Type Treatment;Appt/Treatment Plan Review  Telephone -  Treatment Initiated Date -  Patient Visit Type MedOnc  Treatment Phase Active Tx  Barriers/Navigation Needs Anxiety;Family Concerns;Morbidities/Frailty  Education -  Interventions Psycho-Social Support  Acuity Level 2-Minimal Needs (1-2 Barriers Identified)  Referrals -  Coordination of Care -  Education Method -  Support Groups/Services Friends and Family  Time Spent with Patient 15

## 2020-05-17 NOTE — Telephone Encounter (Signed)
As noted below by Dr. Marin Olp, I informed the wife that the iron level is OK. His thyroid level is low and will need to start Synthroid. She verbalized understanding.

## 2020-05-17 NOTE — Telephone Encounter (Signed)
Appointments scheduled and I notified wife of time/date changes that have been made.  She was ok with new date/time as well per 7/29 los

## 2020-05-17 NOTE — Telephone Encounter (Signed)
-----   Message from Kenneth Napoleon, MD sent at 05/17/2020  4:21 PM EDT ----- Call his wife -- the iron level is ok!!  His thyroid is a little low.  Need to start Synthroid.  I will send in.  Laurey Arrow

## 2020-05-17 NOTE — Addendum Note (Signed)
Addended by: Burney Gauze R on: 05/17/2020 04:42 PM   Modules accepted: Orders

## 2020-05-17 NOTE — Patient Instructions (Signed)

## 2020-05-23 ENCOUNTER — Other Ambulatory Visit: Payer: HMO

## 2020-05-23 ENCOUNTER — Ambulatory Visit: Payer: HMO | Admitting: Hematology & Oncology

## 2020-05-23 ENCOUNTER — Ambulatory Visit: Payer: HMO

## 2020-05-23 DIAGNOSIS — I5023 Acute on chronic systolic (congestive) heart failure: Secondary | ICD-10-CM | POA: Diagnosis not present

## 2020-05-23 DIAGNOSIS — G301 Alzheimer's disease with late onset: Secondary | ICD-10-CM | POA: Diagnosis not present

## 2020-05-23 DIAGNOSIS — I639 Cerebral infarction, unspecified: Secondary | ICD-10-CM | POA: Diagnosis not present

## 2020-05-23 DIAGNOSIS — C649 Malignant neoplasm of unspecified kidney, except renal pelvis: Secondary | ICD-10-CM | POA: Diagnosis not present

## 2020-05-23 DIAGNOSIS — C778 Secondary and unspecified malignant neoplasm of lymph nodes of multiple regions: Secondary | ICD-10-CM | POA: Diagnosis not present

## 2020-05-23 DIAGNOSIS — L8990 Pressure ulcer of unspecified site, unspecified stage: Secondary | ICD-10-CM | POA: Diagnosis not present

## 2020-05-25 ENCOUNTER — Other Ambulatory Visit: Payer: Self-pay | Admitting: *Deleted

## 2020-05-25 DIAGNOSIS — C642 Malignant neoplasm of left kidney, except renal pelvis: Secondary | ICD-10-CM

## 2020-05-25 DIAGNOSIS — C801 Malignant (primary) neoplasm, unspecified: Secondary | ICD-10-CM

## 2020-05-25 DIAGNOSIS — C8203 Follicular lymphoma grade I, intra-abdominal lymph nodes: Secondary | ICD-10-CM

## 2020-05-25 MED ORDER — FENTANYL 25 MCG/HR TD PT72: 1.0000 | MEDICATED_PATCH | TRANSDERMAL | 0 refills | Status: DC

## 2020-05-29 ENCOUNTER — Ambulatory Visit: Payer: Self-pay

## 2020-06-04 ENCOUNTER — Ambulatory Visit: Payer: Self-pay

## 2020-06-06 ENCOUNTER — Other Ambulatory Visit: Payer: Self-pay

## 2020-06-06 ENCOUNTER — Ambulatory Visit (HOSPITAL_COMMUNITY)
Admission: RE | Admit: 2020-06-06 | Discharge: 2020-06-06 | Disposition: A | Discharge status: Home / Self Care | Payer: HMO | Source: Ambulatory Visit | Attending: Hematology & Oncology | Admitting: Hematology & Oncology

## 2020-06-06 DIAGNOSIS — C642 Malignant neoplasm of left kidney, except renal pelvis: Secondary | ICD-10-CM | POA: Diagnosis not present

## 2020-06-06 DIAGNOSIS — I700 Atherosclerosis of aorta: Secondary | ICD-10-CM | POA: Diagnosis not present

## 2020-06-06 DIAGNOSIS — C649 Malignant neoplasm of unspecified kidney, except renal pelvis: Secondary | ICD-10-CM | POA: Diagnosis not present

## 2020-06-06 DIAGNOSIS — N200 Calculus of kidney: Secondary | ICD-10-CM | POA: Diagnosis not present

## 2020-06-06 DIAGNOSIS — I2510 Atherosclerotic heart disease of native coronary artery without angina pectoris: Secondary | ICD-10-CM | POA: Diagnosis not present

## 2020-06-06 LAB — GLUCOSE, CAPILLARY: Glucose-Capillary: 90 mg/dL (ref 70–99)

## 2020-06-06 MED ORDER — FLUDEOXYGLUCOSE F - 18 (FDG) INJECTION
7.9800 | Freq: Once | INTRAVENOUS | Status: AC | PRN
  Administered 2020-06-06: 7.98 via INTRAVENOUS

## 2020-06-07 ENCOUNTER — Ambulatory Visit: Payer: Self-pay

## 2020-06-07 ENCOUNTER — Encounter: Payer: Self-pay | Admitting: *Deleted

## 2020-06-07 NOTE — Progress Notes (Signed)
Patient has PET scan completed and patient's wife would like a review of the results. She also mentions that patient has developed a bright red rash. It is present on the palms of his hands, soles of his feet, and in his groin. It is not itchy or painful. Currently she is using Aquaphor to treat.   Reviewed wife's request for review, and rash with Dr Marin Olp. He will call them this evening with review.  Wife is aware.  Oncology Nurse Navigator Documentation  Oncology Nurse Navigator Flowsheets 06/07/2020  Abnormal Finding Date -  Diagnosis Status -  Planned Course of Treatment -  Phase of Treatment -  Chemotherapy Pending- Reason: -  Chemotherapy Actual Start Date: -  Navigator Follow Up Date: -  Navigator Follow Up Reason: -  Navigator Location [facility]  Referral Date to RadOnc/MedOnc -  Navigator Encounter Type Telephone;Diagnostic Results  Telephone Incoming Call;Symptom Mgt;Diagnostic Results  Treatment Initiated Date -  Patient Visit Type MedOnc  Treatment Phase Active Tx  Barriers/Navigation Needs Anxiety;Family Concerns;Morbidities/Frailty  Education Other  Interventions Psycho-Social Support  Acuity Level 2-Minimal Needs (1-2 Barriers Identified)  Referrals -  Coordination of Care -  Education Method Verbal  Support Groups/Services Friends and Family  Time Spent with Patient 30

## 2020-06-08 ENCOUNTER — Encounter: Payer: Self-pay | Admitting: Hematology & Oncology

## 2020-06-08 ENCOUNTER — Other Ambulatory Visit: Payer: Self-pay

## 2020-06-08 ENCOUNTER — Inpatient Hospital Stay: Payer: HMO | Attending: Hematology & Oncology | Admitting: Hematology & Oncology

## 2020-06-08 ENCOUNTER — Inpatient Hospital Stay: Payer: HMO

## 2020-06-08 VITALS — BP 136/57 | HR 78 | Temp 97.7°F | Resp 20 | Ht 67.0 in

## 2020-06-08 DIAGNOSIS — E119 Type 2 diabetes mellitus without complications: Secondary | ICD-10-CM | POA: Diagnosis not present

## 2020-06-08 DIAGNOSIS — C642 Malignant neoplasm of left kidney, except renal pelvis: Secondary | ICD-10-CM | POA: Diagnosis not present

## 2020-06-08 DIAGNOSIS — C649 Malignant neoplasm of unspecified kidney, except renal pelvis: Secondary | ICD-10-CM

## 2020-06-08 DIAGNOSIS — C778 Secondary and unspecified malignant neoplasm of lymph nodes of multiple regions: Secondary | ICD-10-CM | POA: Diagnosis not present

## 2020-06-08 DIAGNOSIS — D630 Anemia in neoplastic disease: Secondary | ICD-10-CM | POA: Diagnosis not present

## 2020-06-08 DIAGNOSIS — Z5112 Encounter for antineoplastic immunotherapy: Secondary | ICD-10-CM | POA: Diagnosis not present

## 2020-06-08 DIAGNOSIS — R32 Unspecified urinary incontinence: Secondary | ICD-10-CM | POA: Insufficient documentation

## 2020-06-08 LAB — URINALYSIS, COMPLETE (UACMP) WITH MICROSCOPIC
Bilirubin Urine: NEGATIVE
Glucose, UA: NEGATIVE mg/dL
Hgb urine dipstick: NEGATIVE
Ketones, ur: NEGATIVE mg/dL
Leukocytes,Ua: NEGATIVE
Nitrite: NEGATIVE
Protein, ur: NEGATIVE mg/dL
RBC / HPF: NONE SEEN RBC/hpf (ref 0–5)
Specific Gravity, Urine: 1.02 (ref 1.005–1.030)
pH: 5.5 (ref 5.0–8.0)

## 2020-06-08 NOTE — Progress Notes (Signed)
Hematology and Oncology Follow Up Visit  Kenneth Lyons [id number] 04-29-1941 79 y.o. 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  His wife had called yesterday saying that he had a rash in his groin area.  She has been putting some Aquaphor on this.  She is also worried about his dementia getting worse.  She thinks this might be the Cabometyx.  I really have never had a problem with dementia in patients with taking Cabometyx.  I know he has underlying dementia to start with.  We did do a PET scan on him recently.  The PET scan basically showed he had stable disease.  I am not surprised by this.  He has had 3 cycles of nivolumab.  The PET scan was done on 06/06/2020.  The basic was no change in the bulk of nodal metastasis involving left supraclavicular, retroperitoneum and pelvic nodal chains.  He does have the cardiac issues.  These seem to be holding stable right now.  He has not complained of any pain.  He is on the Duragesic patch.  For right now, I told her just to hold the Cabometyx.  I know see a problem with holding this for a month.  Again, if the dementia does not improve, then we know the Cabometyx is not the problem.  If his dementia seems to get better, then we could certainly try another oral agent.  He seems to be eating okay.  He does have diabetes but this is under fairly good control.  When I looked at this rash, it certainly looks like a rash from Candida.  He had  the rash nowhere else.  However, his wife said the rash is getting better with Aquaphor.  He does have urinary incontinence also.  I am sure this is not helping with the rash.  Currently, his performance status is ECOG 2.    Medications:  Current Outpatient Medications:  .  alfuzosin (UROXATRAL) 10 MG 24 hr tablet, Take 10 mg by mouth daily with breakfast. , Disp: , Rfl:  .  ASPIRIN 81 PO, Take 81 mg by mouth daily., Disp: , Rfl:  .  cabozantinib (CABOMETYX) 40 MG tablet, Take 1 tablet (40 mg total) by mouth daily. Take on an empty stomach, 1 hour before or 2 hours after meals., Disp: 30 tablet, Rfl: 6 .  Calcium-Magnesium-Vitamin D (CALCIUM 1200+D3 PO), Take 1 tablet by mouth daily., Disp: , Rfl:  .  carvedilol (COREG) 25 MG tablet, Take 25 mg by mouth 2 (two) times daily with a meal. , Disp: , Rfl:  .  cetirizine (ZYRTEC) 10 MG tablet, Take 10 mg by mouth daily., Disp: , Rfl:  .  Cholecalciferol (VITAMIN D-1000 MAX ST) 25 MCG (1000 UT) tablet, Take 1 capsule by mouth daily., Disp: , Rfl:  .  Cyanocobalamin 5000 MCG SUBL, Place 5,000 mcg under the tongue daily. Place one dose under the tongue daily , Disp: , Rfl:  .  Dulaglutide (TRULICITY) 9.83 JA/2.5KN  SOPN, Inject 0.75 mg into the skin every Wednesday. , Disp: , Rfl:  .  escitalopram (LEXAPRO) 20 MG tablet, Take 1 tablet (20 mg total) by mouth daily., Disp: 30 tablet, Rfl: 3 .  fenofibrate 160 MG tablet, Take 160 mg by mouth daily., Disp: , Rfl:  .  fentaNYL (DURAGESIC) 25 MCG/HR, Place 1 patch onto the skin every 3 (three) days., Disp: 5 patch, Rfl: 0 .  furosemide (LASIX) 20 MG tablet, Take 20 mg by mouth daily., Disp: , Rfl:  .  gabapentin (NEURONTIN) 100 MG capsule, Take 100 mg by mouth at bedtime as needed (pain). , Disp: , Rfl:  .  glipiZIDE (GLUCOTROL) 10 MG tablet, Take 10 mg by mouth daily before breakfast. Take with glipizide 5 mg to equal 15 mg, Disp: , Rfl:  .  glipiZIDE (GLUCOTROL) 5 MG tablet, Take 5 mg by mouth daily before  breakfast. Take with glipizide 10 mg to equal 15 mg, Disp: , Rfl:  .  Glucosamine-Chondroitin 250-200 MG TABS, Take 1 tablet by mouth 2 (two) times daily., Disp: , Rfl:  .  HYDROcodone-acetaminophen (NORCO) 7.5-325 MG tablet, Take 1 tablet by mouth every 6 (six) hours as needed for moderate pain., Disp: 60 tablet, Rfl: 0 .  levothyroxine (SYNTHROID) 50 MCG tablet, Take 1 tablet (50 mcg total) by mouth daily before breakfast., Disp: 30 tablet, Rfl: 4 .  memantine (NAMENDA) 10 MG tablet, Take 10 mg by mouth 2 (two) times daily. , Disp: , Rfl:  .  metFORMIN (GLUCOPHAGE) 1000 MG tablet, Take 1,000 mg by mouth 2 (two) times daily with a meal. , Disp: , Rfl:  .  Multiple Vitamin (MULTIVITAMIN) tablet, Take 1 tablet by mouth daily., Disp: , Rfl:  .  Omega-3 Fatty Acids (FISH OIL PO), Take 2 capsules by mouth daily., Disp: , Rfl:  .  ONETOUCH VERIO test strip, 1 each daily., Disp: , Rfl:  .  rosuvastatin (CRESTOR) 20 MG tablet, Take 20 mg by mouth daily., Disp: , Rfl:  .  sacubitril-valsartan (ENTRESTO) 49-51 MG, Take 1 tablet by mouth 2 (two) times daily., Disp: 60 tablet, Rfl: 11 .  selenium 50 MCG TABS, Take 50 mcg by mouth daily., Disp: , Rfl:   Allergies: No Known Allergies  Past Medical History, Surgical history, Social history, and Family History were reviewed and updated.  Review of Systems: Review of Systems  Constitutional: Positive for appetite change, fatigue and unexpected weight change.  HENT:  Negative.   Eyes: Negative.   Respiratory: Positive for shortness of breath.   Cardiovascular: Positive for leg swelling.  Gastrointestinal: Positive for diarrhea and nausea.  Endocrine: Negative.   Genitourinary: Negative.    Musculoskeletal: Positive for back pain and neck pain.  Skin: Positive for rash.  Neurological: Negative.   Hematological: Negative.   Psychiatric/Behavioral: Negative.     Physical Exam:  height is 5\' 7"  (1.702 m). His oral temperature is 97.7 F (36.5 C). His  blood pressure is 136/57 (abnormal) and his pulse is 78. His respiration is 20 and oxygen saturation is 99%.   Wt Readings from Last 3 Encounters:  05/17/20 160 lb 5 oz (72.7 kg)  04/27/20 165 lb 12.8 oz (75.2 kg)  04/25/20 164 lb 1.6 oz (74.4 kg)    Physical Exam Vitals reviewed.  Constitutional:      Comments: This is a pale appearing elderly looking white male.  He is is in no obvious distress.  HENT:     Head: Normocephalic and atraumatic.  Eyes:     Pupils: Pupils are equal, round, and reactive to light.  Cardiovascular:     Rate and Rhythm: Normal rate and regular rhythm.     Heart sounds: Normal heart sounds.     Comments: Cardiac exam shows a regular rate and rhythm.  He has a 1/6 systolic ejection murmur. Pulmonary:     Effort: Pulmonary effort is normal.     Breath sounds: Normal breath sounds.  Abdominal:     General: Bowel sounds are normal.     Palpations: Abdomen is soft.  Musculoskeletal:        General: No tenderness or deformity. Normal range of motion.     Cervical back: Normal range of motion.     Comments: His extremities shows swelling of the left arm and left leg.  This is pitting edema in both upper and lower extremities.  He has some weakness in both arms and legs.  He has decent range of motion.  He has decent pulses in his distal extremities.  Lymphadenopathy:     Cervical: No cervical adenopathy.  Skin:    General: Skin is warm and dry.     Findings: No erythema or rash.     Comments: He does have a mild erythematous rash in both inguinal areas.  I do not see any obvious exudate.  There is no open areas of skin.  Neurological:     Mental Status: He is alert and oriented to person, place, and time.  Psychiatric:        Behavior: Behavior normal.        Thought Content: Thought content normal.        Judgment: Judgment normal.      Lab Results  Component Value Date   WBC 5.1 05/17/2020   HGB 11.7 (L) 05/17/2020   HCT 37.1 (L) 05/17/2020    MCV 84.1 05/17/2020   PLT 209 05/17/2020     Chemistry      Component Value Date/Time   NA 139 05/17/2020 1115   NA 139 04/10/2020 1157   K 4.0 05/17/2020 1115   CL 103 05/17/2020 1115   CO2 29 05/17/2020 1115   BUN 19 05/17/2020 1115   BUN 32 (H) 04/10/2020 1157   CREATININE 0.91 05/17/2020 1115      Component Value Date/Time   CALCIUM 9.8 05/17/2020 1115   ALKPHOS 62 05/17/2020 1115   AST 55 (H) 05/17/2020 1115   ALT 51 (H) 05/17/2020 1115   BILITOT 0.3 05/17/2020 1115      Impression and Plan: Kenneth Lyons is a very nice 79 year old white male.  He has metastatic renal cell carcinoma.  Thankfully, the renal cell carcinoma is only in lymph nodes and not in the organs.  I still feel okay regarding using single agent nivolumab.  I would really consider doing combination immunotherapy adding ipilimumab but I do still think Mr. Diffee would tolerate this all that well.  Again, I do not see a real problem with stopping the Cabometyx for a month.  We can see if he improves.  This is all about quality of life.  I think the dementia is dictating his quality of life right now.  It certainly is causing a lot of aggravation for his wife.  She has been wonderful and she really has done a terrific job with him and is incredibly proactive with trying to prevent any problems.  He will come in next week for his fourth cycle of nivolumab.  I will plan to see him back in about a month.     Volanda Napoleon, MD 8/20/20215:21 PM

## 2020-06-11 ENCOUNTER — Encounter: Payer: Self-pay | Admitting: *Deleted

## 2020-06-11 ENCOUNTER — Other Ambulatory Visit: Payer: Self-pay | Admitting: *Deleted

## 2020-06-11 DIAGNOSIS — C642 Malignant neoplasm of left kidney, except renal pelvis: Secondary | ICD-10-CM

## 2020-06-11 DIAGNOSIS — C8203 Follicular lymphoma grade I, intra-abdominal lymph nodes: Secondary | ICD-10-CM

## 2020-06-11 DIAGNOSIS — C801 Malignant (primary) neoplasm, unspecified: Secondary | ICD-10-CM

## 2020-06-11 MED ORDER — FENTANYL 25 MCG/HR TD PT72: 1.0000 | MEDICATED_PATCH | TRANSDERMAL | 0 refills | Status: DC

## 2020-06-14 ENCOUNTER — Ambulatory Visit: Payer: HMO | Admitting: Hematology & Oncology

## 2020-06-14 ENCOUNTER — Other Ambulatory Visit: Payer: HMO

## 2020-06-14 ENCOUNTER — Ambulatory Visit: Payer: HMO

## 2020-06-15 ENCOUNTER — Encounter: Payer: Self-pay | Admitting: *Deleted

## 2020-06-15 ENCOUNTER — Other Ambulatory Visit: Payer: Self-pay

## 2020-06-15 ENCOUNTER — Inpatient Hospital Stay: Payer: HMO

## 2020-06-15 ENCOUNTER — Inpatient Hospital Stay: Payer: HMO | Admitting: Hematology & Oncology

## 2020-06-15 ENCOUNTER — Telehealth: Payer: Self-pay | Admitting: *Deleted

## 2020-06-15 DIAGNOSIS — Z5112 Encounter for antineoplastic immunotherapy: Secondary | ICD-10-CM | POA: Diagnosis not present

## 2020-06-15 DIAGNOSIS — D500 Iron deficiency anemia secondary to blood loss (chronic): Secondary | ICD-10-CM

## 2020-06-15 DIAGNOSIS — C642 Malignant neoplasm of left kidney, except renal pelvis: Secondary | ICD-10-CM

## 2020-06-15 DIAGNOSIS — C649 Malignant neoplasm of unspecified kidney, except renal pelvis: Secondary | ICD-10-CM

## 2020-06-15 LAB — CBC WITH DIFFERENTIAL (CANCER CENTER ONLY)
Abs Immature Granulocytes: 0.01 10*3/uL (ref 0.00–0.07)
Basophils Absolute: 0 10*3/uL (ref 0.0–0.1)
Basophils Relative: 1 %
Eosinophils Absolute: 0.4 10*3/uL (ref 0.0–0.5)
Eosinophils Relative: 6 %
HCT: 34.1 % — ABNORMAL LOW (ref 39.0–52.0)
Hemoglobin: 10.9 g/dL — ABNORMAL LOW (ref 13.0–17.0)
Immature Granulocytes: 0 %
Lymphocytes Relative: 9 %
Lymphs Abs: 0.5 10*3/uL — ABNORMAL LOW (ref 0.7–4.0)
MCH: 28.3 pg (ref 26.0–34.0)
MCHC: 32 g/dL (ref 30.0–36.0)
MCV: 88.6 fL (ref 80.0–100.0)
Monocytes Absolute: 0.5 10*3/uL (ref 0.1–1.0)
Monocytes Relative: 8 %
Neutro Abs: 4.7 10*3/uL (ref 1.7–7.7)
Neutrophils Relative %: 76 %
Platelet Count: 210 10*3/uL (ref 150–400)
RBC: 3.85 MIL/uL — ABNORMAL LOW (ref 4.22–5.81)
RDW: 23.7 % — ABNORMAL HIGH (ref 11.5–15.5)
WBC Count: 6 10*3/uL (ref 4.0–10.5)
nRBC: 0 % (ref 0.0–0.2)

## 2020-06-15 LAB — CMP (CANCER CENTER ONLY)
ALT: 23 U/L (ref 0–44)
AST: 26 U/L (ref 15–41)
Albumin: 3.5 g/dL (ref 3.5–5.0)
Alkaline Phosphatase: 51 U/L (ref 38–126)
Anion gap: 4 — ABNORMAL LOW (ref 5–15)
BUN: 31 mg/dL — ABNORMAL HIGH (ref 8–23)
CO2: 30 mmol/L (ref 22–32)
Calcium: 9.9 mg/dL (ref 8.9–10.3)
Chloride: 102 mmol/L (ref 98–111)
Creatinine: 1.11 mg/dL (ref 0.61–1.24)
GFR, Est AFR Am: >60 mL/min (ref >60)
GFR, Estimated: >60 mL/min (ref >60)
Glucose, Bld: 194 mg/dL — ABNORMAL HIGH (ref 70–99)
Potassium: 4.3 mmol/L (ref 3.5–5.1)
Sodium: 136 mmol/L (ref 135–145)
Total Bilirubin: 0.4 mg/dL (ref 0.3–1.2)
Total Protein: 6.1 g/dL — ABNORMAL LOW (ref 6.5–8.1)

## 2020-06-15 LAB — LACTATE DEHYDROGENASE: LDH: 316 U/L — ABNORMAL HIGH (ref 98–192)

## 2020-06-15 LAB — TSH: TSH: 2.603 u[IU]/mL (ref 0.320–4.118)

## 2020-06-15 MED ORDER — DENOSUMAB 120 MG/1.7ML ~~LOC~~ SOLN
120.0000 mg | Freq: Once | SUBCUTANEOUS | Status: AC
  Administered 2020-06-15: 120 mg via SUBCUTANEOUS

## 2020-06-15 MED ORDER — SODIUM CHLORIDE 0.9 % IV SOLN
Freq: Once | INTRAVENOUS | Status: AC
  Filled 2020-06-15: qty 250

## 2020-06-15 MED ORDER — DENOSUMAB 120 MG/1.7ML ~~LOC~~ SOLN
SUBCUTANEOUS | Status: AC
  Filled 2020-06-15: qty 1.7

## 2020-06-15 MED ORDER — HEPARIN SOD (PORK) LOCK FLUSH 100 UNIT/ML IV SOLN
500.0000 [IU] | Freq: Once | INTRAVENOUS | Status: AC | PRN
  Administered 2020-06-15: 500 [IU]
  Filled 2020-06-15: qty 5

## 2020-06-15 MED ORDER — SODIUM CHLORIDE 0.9% FLUSH
10.0000 mL | INTRAVENOUS | Status: DC | PRN
  Administered 2020-06-15: 10 mL
  Filled 2020-06-15: qty 10

## 2020-06-15 MED ORDER — SODIUM CHLORIDE 0.9 % IV SOLN
480.0000 mg | Freq: Once | INTRAVENOUS | Status: AC
  Administered 2020-06-15: 480 mg via INTRAVENOUS
  Filled 2020-06-15: qty 48

## 2020-06-15 NOTE — Progress Notes (Signed)
Oncology Nurse Navigator Documentation  Oncology Nurse Navigator Flowsheets 06/15/2020  Abnormal Finding Date -  Diagnosis Status -  Planned Course of Treatment -  Phase of Treatment -  Chemotherapy Pending- Reason: -  Chemotherapy Actual Start Date: -  Navigator Follow Up Date: 07/12/2020  Navigator Follow Up Reason: Follow-up Appointment;Chemotherapy  Navigator Location [facility]  Referral Date to RadOnc/MedOnc -  Navigator Encounter Type Treatment  Telephone -  Treatment Initiated Date -  Patient Visit Type MedOnc  Treatment Phase Active Tx  Barriers/Navigation Needs Anxiety;Family Concerns;Morbidities/Frailty  Education -  Interventions Psycho-Social Support  Acuity Level 2-Minimal Needs (1-2 Barriers Identified)  Referrals -  Coordination of Care -  Education Method -  Support Groups/Services Friends and Family  Time Spent with Patient 15

## 2020-06-15 NOTE — Patient Instructions (Signed)
[facility] Discharge Instructions for Patients Receiving Chemotherapy  Today you received the following chemotherapy agents Opdivo.  To help prevent nausea and vomiting after your treatment, we encourage you to take your nausea medication as directed.   If you develop nausea and vomiting that is not controlled by your nausea medication, call the clinic.   BELOW ARE SYMPTOMS THAT SHOULD BE REPORTED IMMEDIATELY:  *FEVER GREATER THAN 100.5 F  *CHILLS WITH OR WITHOUT FEVER  NAUSEA AND VOMITING THAT IS NOT CONTROLLED WITH YOUR NAUSEA MEDICATION  *UNUSUAL SHORTNESS OF BREATH  *UNUSUAL BRUISING OR BLEEDING  TENDERNESS IN MOUTH AND THROAT WITH OR WITHOUT PRESENCE OF ULCERS  *URINARY PROBLEMS  *BOWEL PROBLEMS  UNUSUAL RASH Items with * indicate a potential emergency and should be followed up as soon as possible.  Feel free to call the clinic you have any questions or concerns. The clinic phone number is [phone].  Please show the CHEMO ALERT CARD at check-in to the Emergency Department and triage nurse.    

## 2020-06-15 NOTE — Telephone Encounter (Signed)
[facility]lin transfer from [facility] successful. Monitor last updated 04/21/20. Spoke with pt's wife, [name] ([place]). She agrees to try to send manual transmission through monitor. Monitor instructions sent via MyChart.

## 2020-06-18 ENCOUNTER — Emergency Department (HOSPITAL_BASED_OUTPATIENT_CLINIC_OR_DEPARTMENT_OTHER): Payer: HMO

## 2020-06-18 ENCOUNTER — Other Ambulatory Visit: Payer: Self-pay

## 2020-06-18 ENCOUNTER — Emergency Department (HOSPITAL_BASED_OUTPATIENT_CLINIC_OR_DEPARTMENT_OTHER)
Admission: EM | Admit: 2020-06-18 | Discharge: 2020-06-18 | Disposition: A | Discharge status: Home / Self Care | Payer: HMO | Attending: Emergency Medicine | Admitting: Emergency Medicine

## 2020-06-18 ENCOUNTER — Encounter: Payer: Self-pay | Admitting: *Deleted

## 2020-06-18 ENCOUNTER — Encounter (HOSPITAL_BASED_OUTPATIENT_CLINIC_OR_DEPARTMENT_OTHER): Payer: Self-pay | Admitting: Emergency Medicine

## 2020-06-18 DIAGNOSIS — E1165 Type 2 diabetes mellitus with hyperglycemia: Secondary | ICD-10-CM | POA: Insufficient documentation

## 2020-06-18 DIAGNOSIS — I5043 Acute on chronic combined systolic (congestive) and diastolic (congestive) heart failure: Secondary | ICD-10-CM | POA: Diagnosis not present

## 2020-06-18 DIAGNOSIS — G9389 Other specified disorders of brain: Secondary | ICD-10-CM | POA: Diagnosis not present

## 2020-06-18 DIAGNOSIS — I110 Hypertensive heart disease with heart failure: Secondary | ICD-10-CM | POA: Insufficient documentation

## 2020-06-18 DIAGNOSIS — R600 Localized edema: Secondary | ICD-10-CM | POA: Diagnosis not present

## 2020-06-18 DIAGNOSIS — I10 Essential (primary) hypertension: Secondary | ICD-10-CM | POA: Diagnosis not present

## 2020-06-18 DIAGNOSIS — J189 Pneumonia, unspecified organism: Secondary | ICD-10-CM

## 2020-06-18 DIAGNOSIS — Z7982 Long term (current) use of aspirin: Secondary | ICD-10-CM | POA: Insufficient documentation

## 2020-06-18 DIAGNOSIS — J168 Pneumonia due to other specified infectious organisms: Secondary | ICD-10-CM | POA: Diagnosis not present

## 2020-06-18 DIAGNOSIS — G319 Degenerative disease of nervous system, unspecified: Secondary | ICD-10-CM | POA: Diagnosis not present

## 2020-06-18 DIAGNOSIS — F0390 Unspecified dementia without behavioral disturbance: Secondary | ICD-10-CM | POA: Insufficient documentation

## 2020-06-18 DIAGNOSIS — R739 Hyperglycemia, unspecified: Secondary | ICD-10-CM | POA: Diagnosis not present

## 2020-06-18 DIAGNOSIS — I739 Peripheral vascular disease, unspecified: Secondary | ICD-10-CM | POA: Diagnosis not present

## 2020-06-18 DIAGNOSIS — Z87891 Personal history of nicotine dependence: Secondary | ICD-10-CM | POA: Insufficient documentation

## 2020-06-18 DIAGNOSIS — Z7984 Long term (current) use of oral hypoglycemic drugs: Secondary | ICD-10-CM | POA: Diagnosis not present

## 2020-06-18 DIAGNOSIS — Z79899 Other long term (current) drug therapy: Secondary | ICD-10-CM | POA: Insufficient documentation

## 2020-06-18 DIAGNOSIS — I6782 Cerebral ischemia: Secondary | ICD-10-CM | POA: Diagnosis not present

## 2020-06-18 LAB — CBC WITH DIFFERENTIAL/PLATELET
Abs Immature Granulocytes: 0.01 10*3/uL (ref 0.00–0.07)
Basophils Absolute: 0 10*3/uL (ref 0.0–0.1)
Basophils Relative: 1 %
Eosinophils Absolute: 0.3 10*3/uL (ref 0.0–0.5)
Eosinophils Relative: 6 %
HCT: 30.8 % — ABNORMAL LOW (ref 39.0–52.0)
Hemoglobin: 9.7 g/dL — ABNORMAL LOW (ref 13.0–17.0)
Immature Granulocytes: 0 %
Lymphocytes Relative: 9 %
Lymphs Abs: 0.5 10*3/uL — ABNORMAL LOW (ref 0.7–4.0)
MCH: 28.2 pg (ref 26.0–34.0)
MCHC: 31.5 g/dL (ref 30.0–36.0)
MCV: 89.5 fL (ref 80.0–100.0)
Monocytes Absolute: 0.5 10*3/uL (ref 0.1–1.0)
Monocytes Relative: 10 %
Neutro Abs: 3.7 10*3/uL (ref 1.7–7.7)
Neutrophils Relative %: 74 %
Platelets: 209 10*3/uL (ref 150–400)
RBC: 3.44 MIL/uL — ABNORMAL LOW (ref 4.22–5.81)
RDW: 23.4 % — ABNORMAL HIGH (ref 11.5–15.5)
Smear Review: NORMAL
WBC: 5 10*3/uL (ref 4.0–10.5)
nRBC: 0 % (ref 0.0–0.2)

## 2020-06-18 LAB — URINALYSIS, ROUTINE W REFLEX MICROSCOPIC
Bilirubin Urine: NEGATIVE
Glucose, UA: NEGATIVE mg/dL
Hgb urine dipstick: NEGATIVE
Ketones, ur: NEGATIVE mg/dL
Leukocytes,Ua: NEGATIVE
Nitrite: NEGATIVE
Protein, ur: NEGATIVE mg/dL
Specific Gravity, Urine: >1.03 — ABNORMAL HIGH (ref 1.005–1.030)
pH: 5.5 (ref 5.0–8.0)

## 2020-06-18 LAB — BASIC METABOLIC PANEL
Anion gap: 10 (ref 5–15)
BUN: 32 mg/dL — ABNORMAL HIGH (ref 8–23)
CO2: 27 mmol/L (ref 22–32)
Calcium: 9.3 mg/dL (ref 8.9–10.3)
Chloride: 100 mmol/L (ref 98–111)
Creatinine, Ser: 1.19 mg/dL (ref 0.61–1.24)
GFR calc Af Amer: >60 mL/min (ref >60)
GFR calc non Af Amer: 59 mL/min — ABNORMAL LOW (ref >60)
Glucose, Bld: 181 mg/dL — ABNORMAL HIGH (ref 70–99)
Potassium: 4.3 mmol/L (ref 3.5–5.1)
Sodium: 137 mmol/L (ref 135–145)

## 2020-06-18 LAB — CBG MONITORING, ED: Glucose-Capillary: 213 mg/dL — ABNORMAL HIGH (ref 70–99)

## 2020-06-18 MED ORDER — DOXYCYCLINE HYCLATE 100 MG PO CAPS: 100.0000 mg | ORAL_CAPSULE | Freq: Two times a day (BID) | ORAL | 0 refills | Status: AC

## 2020-06-18 MED ORDER — DOXYCYCLINE HYCLATE 100 MG PO CAPS: 100.0000 mg | ORAL_CAPSULE | Freq: Two times a day (BID) | ORAL | 0 refills | Status: DC

## 2020-06-18 MED FILL — DOXYCYCLINE HYCLATE 100 MG: 100 | 7 days supply | Qty: 14 | Fill #0

## 2020-06-18 NOTE — ED Provider Notes (Signed)
[facility]Y DEPARTMENT Provider Note   CSN: [id number] Arrival date & time: 06/18/20  7412     History Chief Complaint  Patient presents with  . Hyperglycemia    Ayomide Purdy is a 79 y.o. male.  HPI     This is a 79 year old male with a history of Alzheimer's, diabetes, hypertension, hypercholesterolemia, and metastatic renal cell carcinoma who presents with confusion and hyperglycemia.  Per family at the bedside, he has had increasing confusion and difficulty getting around.  She reports that normally he is able to have normal conversations and is alert and oriented.  She states "I just need help with him."  She is not noted him to have any fevers but did note yesterday that his blood sugars were in the 300s.  Reports he has been taking his oral diabetes medication.  Patient is without complaint.  He is oriented to person and place but not time.  Denies chest pain, shortness of breath, abdominal pain.  He did just finished a cycle of immunotherapy on Friday.  Level 5 caveat for dementia  Past Medical History:  Diagnosis Date  . Alzheimer's dementia ([place])   . Anemia associated with left renal cell cancer treated with erythropoietin ([place]) 03/14/2020  . Cancer ([place])   . Diabetes mellitus   . GERD (gastroesophageal reflux disease)   . Goals of care, counseling/discussion 02/28/2020  . High cholesterol   . Hypercalcemia of malignancy 03/14/2020  . Hypertension   . Iron deficiency anemia due to chronic blood loss 02/29/2020  . Kidney cancer, primary, with metastasis from kidney to other site, left ([place]) 03/14/2020  . Lymphoma ([place])   . 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 Malignant neoplasm of kidney metastatic to lymph nodes of multiple regions ([place]) 03/14/2020  . Stroke [facility])     Patient Active Problem List   Diagnosis Date Noted  . Acute on chronic systolic CHF (congestive heart failure), NYHA class 3 ([place])   . Pressure injury of skin  03/18/2020  . Acute respiratory failure with hypoxia ([place]) 03/17/2020  . Acute on chronic diastolic CHF (congestive heart failure) ([place]) 03/17/2020  . NSTEMI (non-ST elevated myocardial infarction) ([place]) 03/17/2020  . Sepsis with acute hypoxic respiratory failure without septic shock ([place]) 03/17/2020  . Mixed hyperlipidemia due to type 2 diabetes mellitus ([place]) 03/17/2020  . Kidney cancer, primary, with metastasis from kidney to other site, left ([place]) 03/14/2020  . Anemia associated with left renal cell cancer treated with erythropoietin ([place]) 03/14/2020  . Malignant neoplasm of kidney metastatic to lymph nodes of multiple regions ([place]) 03/14/2020  . Hypercalcemia of malignancy 03/14/2020  . Iron deficiency anemia due to chronic blood loss 02/29/2020  . Goals of care, counseling/discussion 02/28/2020  . Stroke ([place]) 10/20/2019  . Diabetes mellitus ([place]) 10/20/2019  . Dementia of the Alzheimer's type with late onset without behavioral disturbance ([place]) 01/03/2019  . Type 2 diabetes mellitus without retinopathy ([place]) 08/05/2018  . Tricuspid regurgitation 05/19/2018  . Hyperplastic rectal polyp 02/22/2018  . Dermatochalasis of both upper eyelids 08/04/2017  . Epiretinal membrane (ERM) of right eye 08/04/2017  . Keratoconjunctivitis sicca of both eyes not specified as Sjogren's 08/04/2017  . Pinguecula, left eye 08/04/2017  . Presbyopia of both eyes 08/04/2017  . Vitreous floater, bilateral 08/04/2017  . Dementia ([place]) 06/03/2016  . Controlled type 2 diabetes mellitus with microalbuminuria, without long-term current use of insulin ([place]) 04/16/2016  . Benign paroxysmal positional vertigo due to bilateral vestibular disorder  04/16/2016  . Pacemaker reprogramming/check 04/16/2016  . Essential hypertension 10/29/2015  . H/O malignant neoplasm of prostate 10/29/2015  . H/O: CVA (cerebrovascular accident) 10/29/2015  . Atrioventricular block, complete ([place]) 10/29/2015  . Carotid bruit  10/29/2015  . Cervical spondylosis without myelopathy 10/29/2015  . DDD (degenerative disc disease), lumbosacral 10/29/2015  . Diverticulosis of colon 10/29/2015  . Fredrickson type IIb or III hyperlipoproteinemia 10/29/2015  . Hearing loss 10/29/2015  . History of benign neoplasm of colon 10/29/2015  . Hypertriglyceridemia 10/29/2015  . Osteoarthritis, knee 10/29/2015  . Right knee pain 10/29/2015  . Pseudophakia of both eyes 10/29/2015    Past Surgical History:  Procedure Laterality Date  . APPENDECTOMY    . IR IMAGING GUIDED PORT INSERTION  03/20/2020  . IR US GUIDE BX ASP/DRAIN  03/08/2020  . PACEMAKER INSERTION Left 08/31/2012   SJM model Accent RF PM2210 (SN [id number]) dual chamber pacemaker implanted by Dr Jonetta Osgood for complete heart block  . PROSTATE SURGERY    . TONSILLECTOMY         Family History  Problem Relation Age of Onset  . Breast cancer Daughter   . Cancer Daughter   . Cataracts Father   . Cancer - Prostate Father   . Cancer Father   . Cataracts Mother   . Cataracts Paternal Grandmother   . Cataracts Paternal Grandfather   . Cataracts Maternal Grandmother   . Cataracts Maternal Grandfather   . Other Neg Hx   . Anxiety disorder Neg Hx   . Ataxia Neg Hx   . Chorea Neg Hx   . Dementia Neg Hx   . Depression Neg Hx   . Diabetes Neg Hx   . Headache Neg Hx   . Heart disease Neg Hx   . Intellectual disability Neg Hx   . Multiple sclerosis Neg Hx   . Neurofibromatosis Neg Hx   . Neuropathy Neg Hx   . Parkinsonism Neg Hx   . Seizures Neg Hx   . Stroke Neg Hx     Social History   Tobacco Use  . Smoking status: Former Research scientist (life sciences)  . Smokeless tobacco: Never Used  Vaping Use  . Vaping Use: Never used  Substance Use Topics  . Alcohol use: Yes    Comment: occ  . Drug use: No    Home Medications Prior to Admission medications   Medication Sig Start Date End Date Taking? Authorizing Provider  alfuzosin (UROXATRAL) 10 MG 24 hr tablet Take 10 mg by mouth  daily with breakfast.  08/18/19   [provider]  ASPIRIN 81 PO Take 81 mg by mouth daily.    [provider]  cabozantinib (CABOMETYX) 40 MG tablet Take 1 tablet (40 mg total) by mouth daily. Take on an empty stomach, 1 hour before or 2 hours after meals. 03/21/20   Volanda Napoleon, MD  Calcium-Magnesium-Vitamin D (CALCIUM 1200+D3 PO) Take 1 tablet by mouth daily. 08/31/18   [provider]  carvedilol (COREG) 25 MG tablet Take 25 mg by mouth 2 (two) times daily with a meal.  01/28/18   [provider]  cetirizine (ZYRTEC) 10 MG tablet Take 10 mg by mouth daily.    [provider]  Cholecalciferol (VITAMIN D-1000 MAX ST) 25 MCG (1000 UT) tablet Take 1 capsule by mouth daily.    [provider]  Cyanocobalamin 5000 MCG SUBL Place 5,000 mcg under the tongue daily. Place one dose under the tongue daily     [provider]  Dulaglutide (TRULICITY) 4.40 HK/7.4QV SOPN Inject 0.75 mg into the skin every Wednesday.     [provider]  escitalopram (LEXAPRO) 20 MG tablet Take 1 tablet (20 mg total) by mouth daily. 05/17/20   Volanda Napoleon, MD  fenofibrate 160 MG tablet Take 160 mg by mouth daily. 04/03/20   [provider]  fentaNYL (DURAGESIC) 25 MCG/HR Place 1 patch onto the skin every 3 (three) days. 06/11/20   Volanda Napoleon, MD  furosemide (LASIX) 20 MG tablet Take 20 mg by mouth daily. 03/26/20   [provider]  gabapentin (NEURONTIN) 100 MG capsule Take 100 mg by mouth at bedtime as needed (pain).  02/24/20   [provider]  glipiZIDE (GLUCOTROL) 10 MG tablet Take 10 mg by mouth daily before breakfast. Take with glipizide 5 mg to equal 15 mg    [provider]  glipiZIDE (GLUCOTROL) 5 MG tablet Take 5 mg by mouth daily before breakfast. Take with glipizide 10 mg to equal 15 mg    [provider]  Glucosamine-Chondroitin 250-200 MG TABS Take 1 tablet by mouth 2 (two) times daily.     [provider]  HYDROcodone-acetaminophen (NORCO) 7.5-325 MG tablet Take 1 tablet by mouth every 6 (six) hours as needed for moderate pain. 03/14/20   Volanda Napoleon, MD  levothyroxine (SYNTHROID) 50 MCG tablet Take 1 tablet (50 mcg total) by mouth daily before breakfast. 05/17/20   Volanda Napoleon, MD  memantine (NAMENDA) 10 MG tablet Take 10 mg by mouth 2 (two) times daily.  05/12/17   [provider]  metFORMIN (GLUCOPHAGE) 1000 MG tablet Take 1,000 mg by mouth 2 (two) times daily with a meal.  01/28/18   [provider]  Multiple Vitamin (MULTIVITAMIN) tablet Take 1 tablet by mouth daily.    [provider]  Omega-3 Fatty Acids (FISH OIL PO) Take 2 capsules by mouth daily.    [provider]  [facility] VERIO test strip 1 each daily. 04/01/20   [provider]  rosuvastatin (CRESTOR) 20 MG tablet Take 20 mg by mouth daily. 08/17/19   [provider]  sacubitril-valsartan (ENTRESTO) 49-51 MG Take 1 tablet by mouth 2 (two) times daily. 04/11/20   O'NealCassie Freer, MD  selenium 50 MCG TABS Take 50 mcg by mouth daily.    [provider]  prochlorperazine (COMPAZINE) 10 MG tablet Take 1 tablet (10 mg total) by mouth every 6 (six) hours as needed (Nausea or vomiting). 03/20/20 04/27/20  Volanda Napoleon, MD    Allergies    Patient has no known allergies.  Review of Systems   Review of Systems  Unable to perform ROS: Dementia    Physical Exam Updated Vital Signs BP 131/61   Pulse 78   Temp 99.9 F (37.7 C) (Oral)   Resp 17   Ht 1.702 m (5\' 7" )   Wt 72.6 kg   SpO2 97%   BMI 25.06 kg/m   Physical Exam Vitals and nursing note reviewed.  Constitutional:      Appearance: He is well-developed.     Comments: Elderly, nontoxic-appearing, no acute distress  HENT:     Head: Normocephalic and atraumatic.     Nose: Nose normal.     Mouth/Throat:     Mouth: Mucous membranes are moist.  Eyes:     Pupils: Pupils are  equal, round, and reactive to light.  Cardiovascular:     Rate and Rhythm: Normal rate and regular rhythm.  Heart sounds: Normal heart sounds. No murmur heard.   Pulmonary:     Effort: Pulmonary effort is normal. No respiratory distress.     Breath sounds: Normal breath sounds. No wheezing.  Abdominal:     General: Bowel sounds are normal.     Palpations: Abdomen is soft.     Tenderness: There is no abdominal tenderness. There is no rebound.  Musculoskeletal:     Cervical back: Neck supple.     Comments: Trace bilateral lower extremity edema  Lymphadenopathy:     Cervical: No cervical adenopathy.  Skin:    General: Skin is warm and dry.  Neurological:     Mental Status: He is alert.     Comments: Oriented x2, at times difficulty answering simple questions and seems to forget the question, 5 out of 5 strength in all 4 extremities, no dysmetria to finger-nose-finger     ED Results / Procedures / Treatments   Labs (all labs ordered are listed, but only abnormal results are displayed) Labs Reviewed  URINALYSIS, ROUTINE W REFLEX MICROSCOPIC - Abnormal; Notable for the following components:      Result Value   APPearance HAZY (*)    Specific Gravity, Urine >1.030 (*)    All other components within normal limits  CBC WITH DIFFERENTIAL/PLATELET - Abnormal; Notable for the following components:   RBC 3.44 (*)    Hemoglobin 9.7 (*)    HCT 30.8 (*)    RDW 23.4 (*)    All other components within normal limits  CBG MONITORING, ED - Abnormal; Notable for the following components:   Glucose-Capillary 213 (*)    All other components within normal limits  BASIC METABOLIC PANEL    EKG ED ECG REPORT   Date: 06/18/2020  Rate: 76  Rhythm: normal sinus rhythm  QRS Axis: normal  Intervals: normal  ST/T Wave abnormalities: normal  Conduction Disutrbances:nonspecific intraventricular conduction delay  Narrative Interpretation:   Old EKG Reviewed: unchanged; ventricular  preexcitation  I have personally reviewed the EKG tracing and agree with the computerized printout as noted.  Radiology CT Head Wo Contrast  Result Date: 06/18/2020 CLINICAL DATA:  Mental status change with unknown cause EXAM: CT HEAD WITHOUT CONTRAST TECHNIQUE: Contiguous axial images were obtained from the base of the skull through the vertex without intravenous contrast. COMPARISON:  04/25/2020 FINDINGS: Brain: No evidence of acute infarction, hemorrhage, hydrocephalus, extra-axial collection or mass lesion/mass effect. Brain atrophy with ventriculomegaly. Chronic small vessel ischemia with confluent periventricular gliosis and remote perforator/lacunar infarcts centered at the left basal ganglia. Small remote right cerebellar infarct. Vascular: No hyperdense vessel or unexpected calcification. Skull: Normal. Negative for fracture or focal lesion. Sinuses/Orbits: Bilateral cataract resection IMPRESSION: 1. No emergent finding or change from prior. 2. Atrophy and chronic small vessel disease. Electronically Signed   By: Monte Fantasia M.D.   On: 06/18/2020 06:01    Procedures Procedures (including critical care time)  Medications Ordered in ED Medications - No data to display  ED Course  I have reviewed the triage vital signs and the nursing notes.  Pertinent labs & imaging results that were available during my care of the patient were reviewed by me and considered in my medical decision making (see chart for details).    MDM Rules/Calculators/A&P                           Patient presents with increasing confusion and hyperglycemia.  Oriented x2 on exam.  He is without physical complaint.  Family reports that he is having difficulty getting around as well.  He has nonfocal on his exam.  Doubt stroke.  Given hyperglycemia, would have strong suspicion for possible occult infection including urinary tract infection.  Breath sounds are clear and without upper respiratory symptoms.  Lab work  obtained.  Blood sugar 213.  Urinalysis without obvious infection.  CBC with slightly downtrending hemoglobin.  Final Clinical Impression(s) / ED Diagnoses Final diagnoses:  None    Rx / DC Orders ED Discharge Orders    None       Merryl Hacker, MD 06/18/20 2335

## 2020-06-18 NOTE — ED Triage Notes (Signed)
Arrives with family member complaining of hyerglycemia - sugars have been in the 300s since yesterday. Pt is also more confused than normal (hx alzheimers) and is having trouble ambulating. Alert to self only.

## 2020-06-18 NOTE — Discharge Instructions (Addendum)
Please schedule follow-up appointment to be seen in the next day or two by his primary care doctor.  Take antibiotic as prescribed for the possible pneumonia.  If you have any falls, difficulty breathing, fever, chest pain or other new concerning symptom, please return to ER for reassessment.

## 2020-06-18 NOTE — Progress Notes (Signed)
Received a call from patient's wife, Kenneth Lyons, notifying the office that she had to bring patient to the ED early this morning for severe confusion, increased blood sugars, and weakness. Patient was discharged back to home. Their daughter is now here from out of town and they are working with the PCP on nursing home placement.   Kenneth Lyons states the dementia has not shown any improvement off the Cabometyx which has been held. Upon review of patient's medication, she notes that patient was started in synthroid around the same the dementia started to get worse. She isn't sure if that medication may be a potential cause.  Provided support to [place]. She has done very well managing her husband's dementia to this point and needs to make decisions that are in the best interest of both of them. She knows she can reach out to this office if she has any needs.  Dr Marin Olp updated.  Oncology Nurse Navigator Documentation  Oncology Nurse Navigator Flowsheets 06/18/2020  Abnormal Finding Date -  Diagnosis Status -  Planned Course of Treatment -  Phase of Treatment -  Chemotherapy Pending- Reason: -  Chemotherapy Actual Start Date: -  Navigator Follow Up Date: 07/12/2020  Navigator Follow Up Reason: Follow-up Appointment;Chemotherapy  Navigator Location [facility]  Referral Date to RadOnc/MedOnc -  Navigator Encounter Type Telephone  Telephone Patient Update;Incoming Call  Treatment Initiated Date -  Patient Visit Type MedOnc  Treatment Phase Active Tx  Barriers/Navigation Needs Anxiety;Family Concerns;Morbidities/Frailty  Education -  Interventions Psycho-Social Support  Acuity Level 2-Minimal Needs (1-2 Barriers Identified)  Referrals -  Coordination of Care -  Education Method -  Support Groups/Services Friends and Family  Time Spent with Patient 48

## 2020-06-18 NOTE — ED Provider Notes (Signed)
Signout note  79 year old male Alzheimer's, diabetes, hypertension, hyperlipidemia, metastatic renal cell carcinoma presenting to ER with concern for increased confusion, hyperglycemia.  Nonfocal neurologic exam.  CT head, CXR, basic labs, UA completed to further assess.  Patient well-appearing, with stable vitals, anticipate discharge home if work-up reassuring.   7:00 AM reeceived sign out from [facility]  7:52 AM reviewed results, CT head negative; CBC mild anemia, no leukocytosis; BMP showed no acidosis, stable; UA negative; CXR with possible LLL infiltrate.  Updated on results. Patient is well appearing with stable vital signs. Given current clinical picture, I believe he is appropriate for discharge and out patient management. I recommended close f/u with his regular doctors including his primary to discuss diabetes management. Wife inquired about home health, will place consult order to [facility] to assist. Will place order for F2F.   Lucrezia Starch, MD 06/18/20 [phone]

## 2020-06-18 NOTE — ED Notes (Signed)
ED Provider at bedside. 

## 2020-06-19 NOTE — Progress Notes (Signed)
06/19/2020 109 pm TOC CM contacted pt to arrange HH. Left HIPAA compliant message for return call. [place], [place] ED TOC CM [phone]

## 2020-06-20 ENCOUNTER — Telehealth: Payer: Self-pay | Admitting: Hematology & Oncology

## 2020-06-20 ENCOUNTER — Encounter: Payer: Self-pay | Admitting: *Deleted

## 2020-06-20 NOTE — Progress Notes (Signed)
06/20/2020 200 pm TOC CM spoke to pt's wife and states pt has an appt with PCP this week. She will discuss [facility] vs SNF rehab. Provided wife with CM number and explained HH can be set up. States they have used [facility]. ED provider updated. [place], [place] ED TOC CM [phone]

## 2020-06-20 NOTE — Progress Notes (Signed)
[name], patient's wife following up with the office. She wants to know what Dr Marin Olp stated regarding the initiation of synthroid and the patient's dementia. His response was "I just think this is the progression of his underlying dementia. synthroid would help dementia is hypothyroid is an issue. Laurey Arrow"  Wife has many questions about when to continue vs stop treatment, how long the synthroid will continue, and what the goals of the patient's scans are. I tried to answer her questions, but will give Dr Marin Olp a message to call [name] so that she can get a better sense of goals of treatment, and risk/benefit of continuing. She also mentions that plans have shifted from a facility and she is now working on in-home care.  Dr Marin Olp given message to call patient's wife.   Oncology Nurse Navigator Documentation  Oncology Nurse Navigator Flowsheets 06/20/2020  Abnormal Finding Date -  Diagnosis Status -  Planned Course of Treatment -  Phase of Treatment -  Chemotherapy Pending- Reason: -  Chemotherapy Actual Start Date: -  Navigator Follow Up Date: 07/12/2020  Navigator Follow Up Reason: Follow-up Appointment  Navigator Location [facility]  Referral Date to RadOnc/MedOnc -  Navigator Encounter Type Telephone  Telephone Incoming Call;Patient Update  Treatment Initiated Date -  Patient Visit Type MedOnc  Treatment Phase Active Tx  Barriers/Navigation Needs Anxiety;Family Concerns;Morbidities/Frailty  Education Other  Interventions Psycho-Social Support  Acuity Level 2-Minimal Needs (1-2 Barriers Identified)  Referrals -  Coordination of Care -  Education Method Verbal  Support Groups/Services Friends and Family  Time Spent with Patient 30

## 2020-06-20 NOTE — Telephone Encounter (Signed)
Appointments scheduled calendar printed & mailed per 8/20 los

## 2020-06-21 DIAGNOSIS — Z23 Encounter for immunization: Secondary | ICD-10-CM | POA: Diagnosis not present

## 2020-06-21 DIAGNOSIS — G309 Alzheimer's disease, unspecified: Secondary | ICD-10-CM | POA: Diagnosis not present

## 2020-06-21 DIAGNOSIS — E1122 Type 2 diabetes mellitus with diabetic chronic kidney disease: Secondary | ICD-10-CM | POA: Diagnosis not present

## 2020-06-21 DIAGNOSIS — F0280 Dementia in other diseases classified elsewhere without behavioral disturbance: Secondary | ICD-10-CM | POA: Diagnosis not present

## 2020-06-21 DIAGNOSIS — I129 Hypertensive chronic kidney disease with stage 1 through stage 4 chronic kidney disease, or unspecified chronic kidney disease: Secondary | ICD-10-CM | POA: Diagnosis not present

## 2020-06-22 ENCOUNTER — Other Ambulatory Visit: Payer: Self-pay | Admitting: Hematology & Oncology

## 2020-06-22 DIAGNOSIS — C801 Malignant (primary) neoplasm, unspecified: Secondary | ICD-10-CM

## 2020-06-22 DIAGNOSIS — C8203 Follicular lymphoma grade I, intra-abdominal lymph nodes: Secondary | ICD-10-CM

## 2020-06-22 DIAGNOSIS — C642 Malignant neoplasm of left kidney, except renal pelvis: Secondary | ICD-10-CM

## 2020-06-22 MED ORDER — FENTANYL 25 MCG/HR TD PT72: 1.0000 | MEDICATED_PATCH | TRANSDERMAL | 0 refills | Status: DC

## 2020-06-23 DIAGNOSIS — G301 Alzheimer's disease with late onset: Secondary | ICD-10-CM | POA: Diagnosis not present

## 2020-06-23 DIAGNOSIS — C649 Malignant neoplasm of unspecified kidney, except renal pelvis: Secondary | ICD-10-CM | POA: Diagnosis not present

## 2020-06-23 DIAGNOSIS — L8990 Pressure ulcer of unspecified site, unspecified stage: Secondary | ICD-10-CM | POA: Diagnosis not present

## 2020-06-23 DIAGNOSIS — I639 Cerebral infarction, unspecified: Secondary | ICD-10-CM | POA: Diagnosis not present

## 2020-06-23 DIAGNOSIS — C778 Secondary and unspecified malignant neoplasm of lymph nodes of multiple regions: Secondary | ICD-10-CM | POA: Diagnosis not present

## 2020-06-23 DIAGNOSIS — I5023 Acute on chronic systolic (congestive) heart failure: Secondary | ICD-10-CM | POA: Diagnosis not present

## 2020-06-26 DIAGNOSIS — R32 Unspecified urinary incontinence: Secondary | ICD-10-CM | POA: Diagnosis not present

## 2020-06-26 DIAGNOSIS — G301 Alzheimer's disease with late onset: Secondary | ICD-10-CM | POA: Diagnosis not present

## 2020-06-26 DIAGNOSIS — F0281 Dementia in other diseases classified elsewhere with behavioral disturbance: Secondary | ICD-10-CM | POA: Diagnosis not present

## 2020-06-27 ENCOUNTER — Telehealth: Payer: Self-pay | Admitting: Hematology & Oncology

## 2020-06-27 ENCOUNTER — Encounter: Payer: Self-pay | Admitting: *Deleted

## 2020-06-27 ENCOUNTER — Other Ambulatory Visit: Payer: Self-pay

## 2020-06-27 ENCOUNTER — Telehealth: Payer: Self-pay | Admitting: Cardiovascular Disease

## 2020-06-27 NOTE — Progress Notes (Signed)
Patient's daughter, Tilda Burrow, calling the office to follow up on conversation from last week. Patient's wife and family are discussing ending all treatments, but need to speak with Dr Marin Olp to help them understand the timeline and expectations of each possible decision. They are also potentially interested in palliative or hospice care.   Patient's next appointment is not until the 23rd. Will get patient and his family an appointment sooner to meet with Dr Marin Olp and discuss goals of care and treatment decisions. Spoke to [facility] and for this appointment, we will make an exception and allow for 2 visitors, to allow for wife and daughter. Message sent to scheduling.  Oncology Nurse Navigator Documentation  Oncology Nurse Navigator Flowsheets 06/27/2020  Abnormal Finding Date -  Diagnosis Status -  Planned Course of Treatment -  Phase of Treatment -  Chemotherapy Pending- Reason: -  Chemotherapy Actual Start Date: -  Navigator Follow Up Date: 07/04/2020  Navigator Follow Up Reason: Follow-up Appointment  Navigator Location [facility]  Referral Date to RadOnc/MedOnc -  Navigator Encounter Type Telephone  Telephone Incoming Call;Patient Update;Appt Confirmation/Clarification  Treatment Initiated Date -  Patient Visit Type MedOnc  Treatment Phase Active Tx  Barriers/Navigation Needs Anxiety;Family Concerns;Morbidities/Frailty  Education Other  Interventions Psycho-Social Support  Acuity Level 2-Minimal Needs (1-2 Barriers Identified)  Referrals -  Coordination of Care Appts  Education Method Verbal  Support Groups/Services Friends and Family  Time Spent with Patient 90

## 2020-06-27 NOTE — Telephone Encounter (Signed)
Please prescribe the lasix. -W

## 2020-06-27 NOTE — Telephone Encounter (Signed)
I called and spoke with patient wife regarding appointment scheduled for 9/15.  She was ok with both date/time per 9/8 sch  msg

## 2020-06-27 NOTE — Telephone Encounter (Signed)
    Pt c/o medication issue:  1. Name of Medication:   furosemide (LASIX) 20 MG tablet    2. How are you currently taking this medication (dosage and times per day)? Take 20 mg by mouth daily.  3. Are you having a reaction (difficulty breathing--STAT)?   4. What is your medication issue? Pt's wife asking if Dr. Audie Box can refill this prescription for pt. She is if Dr. Audie Box agreed please send 90 days and send it to [place], [place]

## 2020-06-27 NOTE — Telephone Encounter (Signed)
Patients wife called into office in regards to patients lasix. She stated that patient has been taking 20mg  of lasix once daily and has had no issues, swelling, etc. Patients wife would like to know if it Dr. Audie Box would give them a new prescription for the 20mg  of lasix as the patient took his last dose today, and the patients old cardiologist had ordered the lasix prior. Advised patient I would check with Dr. Audie Box regarding this.

## 2020-06-28 MED ORDER — FUROSEMIDE 20 MG PO TABS
20.0000 mg | ORAL_TABLET | Freq: Every day | ORAL | 3 refills | Status: DC
Start: 2020-06-28 — End: 2020-08-06

## 2020-06-28 NOTE — Telephone Encounter (Signed)
Patient's wife calling to follow up. She states the patient is completely out of the medication.

## 2020-06-28 NOTE — Telephone Encounter (Signed)
Spoke with the patient"s wife and advised her that Dr. Audie Box will send in Rx for lasix 20 mg daily

## 2020-06-29 ENCOUNTER — Ambulatory Visit (INDEPENDENT_AMBULATORY_CARE_PROVIDER_SITE_OTHER): Payer: HMO | Admitting: *Deleted

## 2020-06-29 DIAGNOSIS — I442 Atrioventricular block, complete: Secondary | ICD-10-CM | POA: Diagnosis not present

## 2020-06-29 LAB — CUP PACEART REMOTE DEVICE CHECK
Battery Remaining Longevity: 40 mo
Battery Remaining Percentage: 35 %
Battery Voltage: 2.83 V
Brady Statistic AP VP Percent: 48 %
Brady Statistic AP VS Percent: 1 %
Brady Statistic AS VP Percent: 51 %
Brady Statistic AS VS Percent: 1 %
Brady Statistic RA Percent Paced: 46 %
Brady Statistic RV Percent Paced: 99 %
Date Time Interrogation Session: 20210910110601
Implantable Pulse Generator Implant Date: 20131112
Lead Channel Impedance Value: 330 Ohm
Lead Channel Impedance Value: 340 Ohm
Lead Channel Pacing Threshold Amplitude: 0.625 V
Lead Channel Pacing Threshold Amplitude: 1.25 V
Lead Channel Pacing Threshold Pulse Width: 0.4 ms
Lead Channel Pacing Threshold Pulse Width: 0.4 ms
Lead Channel Sensing Intrinsic Amplitude: 1.9 mV
Lead Channel Sensing Intrinsic Amplitude: 8 mV
Lead Channel Setting Pacing Amplitude: 1.5 V
Lead Channel Setting Pacing Amplitude: 1.625
Lead Channel Setting Pacing Pulse Width: 0.4 ms
Lead Channel Setting Sensing Sensitivity: 4 mV
Pulse Gen Model: 2210
Pulse Gen Serial Number: 7417603

## 2020-07-02 NOTE — Progress Notes (Signed)
Remote pacemaker transmission.   

## 2020-07-03 IMAGING — CT CT RENAL STONE PROTOCOL
2 of 4 series · 16 of 46 positions shown, 18 images · non-contrast
Comparison: CT 03/03/2012

CLINICAL DATA: Low back pain.  LEFT flank pain for 3 weeks.

EXAM:
CT ABDOMEN AND PELVIS WITHOUT CONTRAST
TECHNIQUE: Multidetector CT imaging of the abdomen and pelvis was performed
following the standard protocol without IV contrast.

[Series 2: axial st · axial · 0.75mm/px · z∈[+1084,+1454]mm · 13 of 82 slices shown, 15 images]
[im 4/82  soft-tissue]
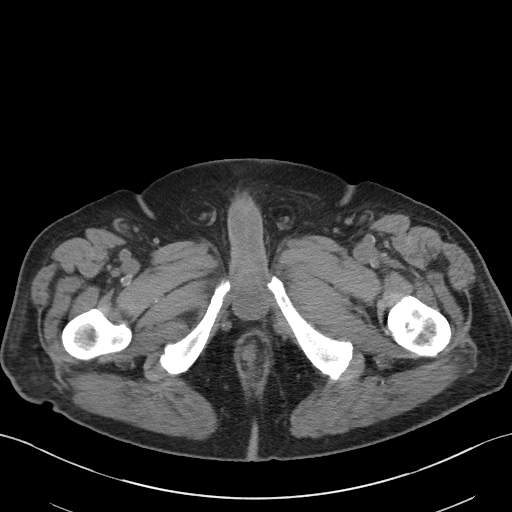
[im 4/82  bone]
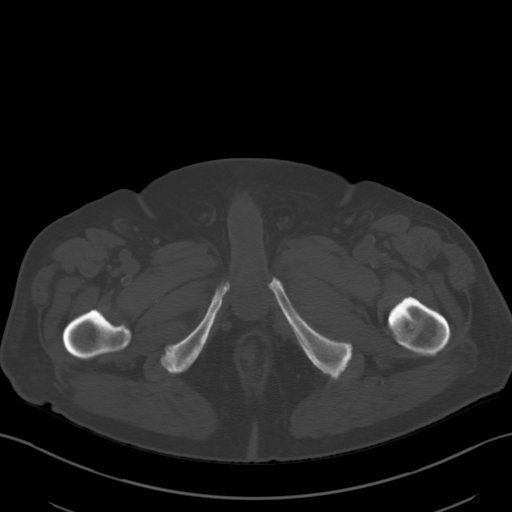
[im 10/82  soft-tissue]
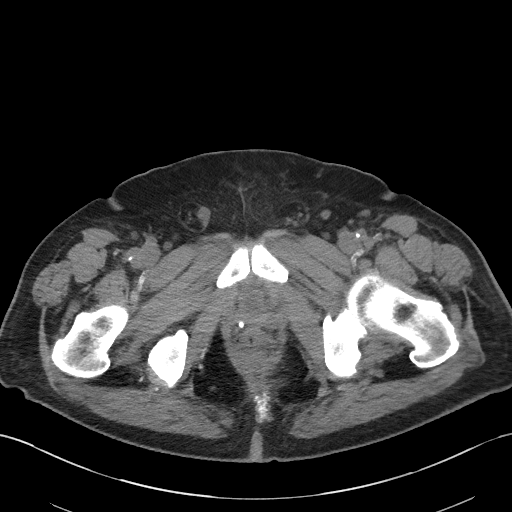
[im 17/82  soft-tissue]
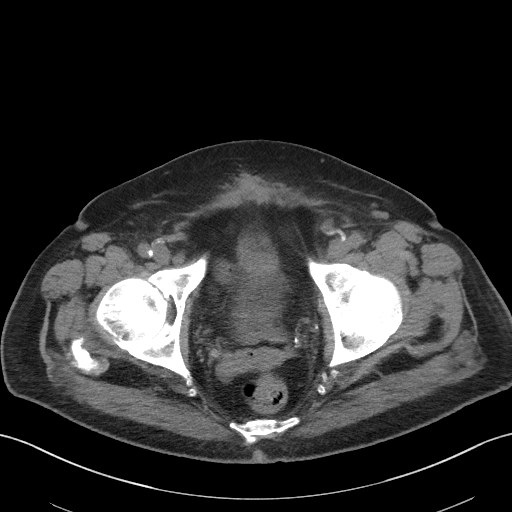
[im 23/82  soft-tissue]
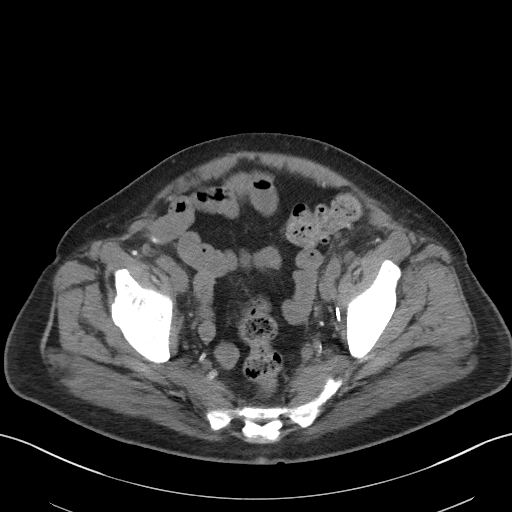
[im 30/82  soft-tissue]
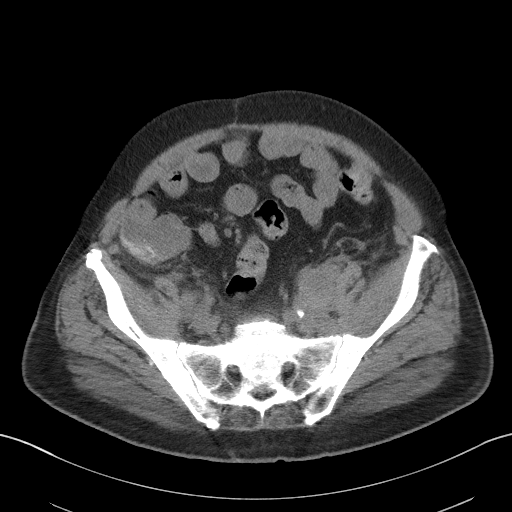
[im 36/82  soft-tissue]
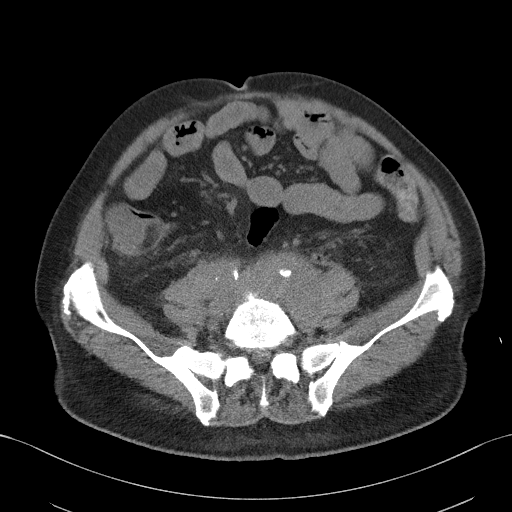
[im 43/82  soft-tissue]
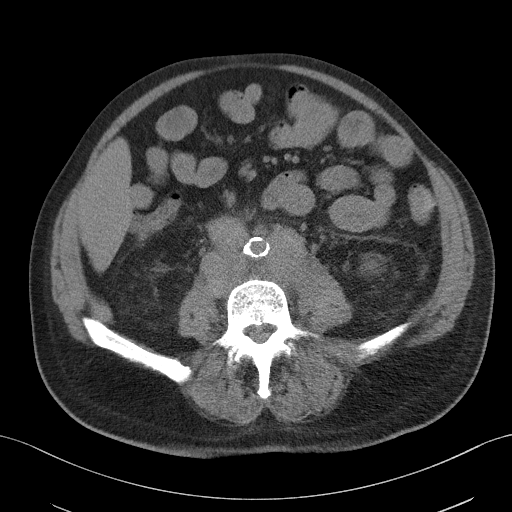
[im 46/82  soft-tissue]
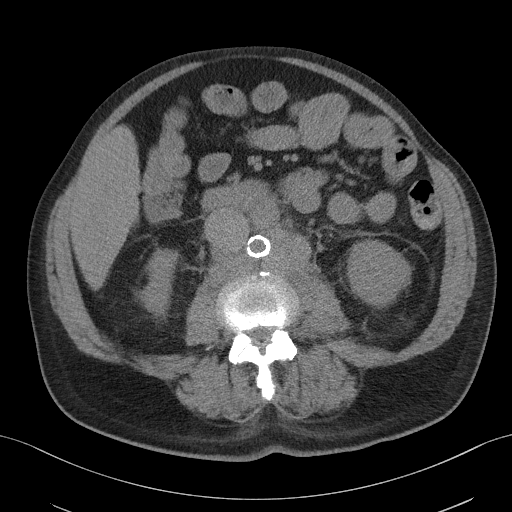
[im 52/82  soft-tissue]
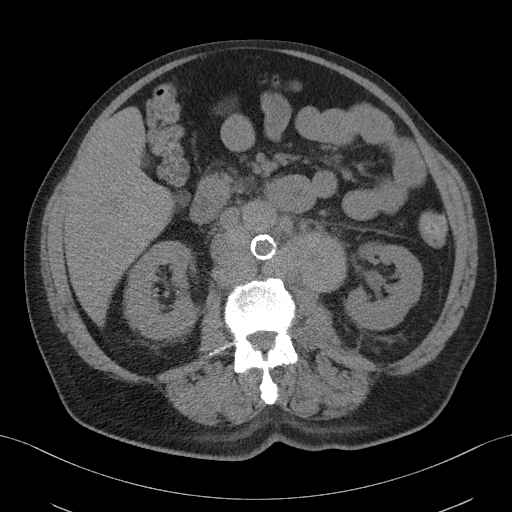
[im 52/82  bone]
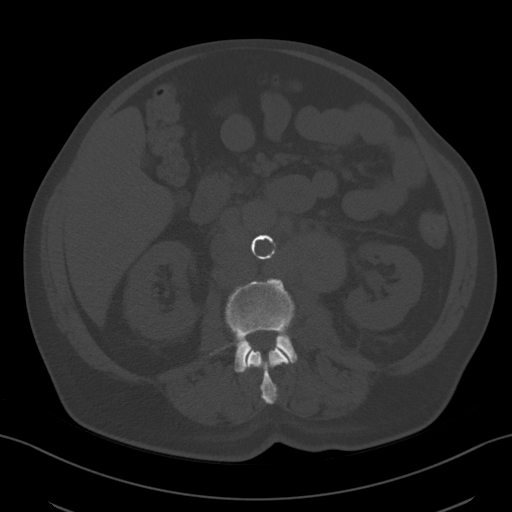
[im 59/82  soft-tissue]
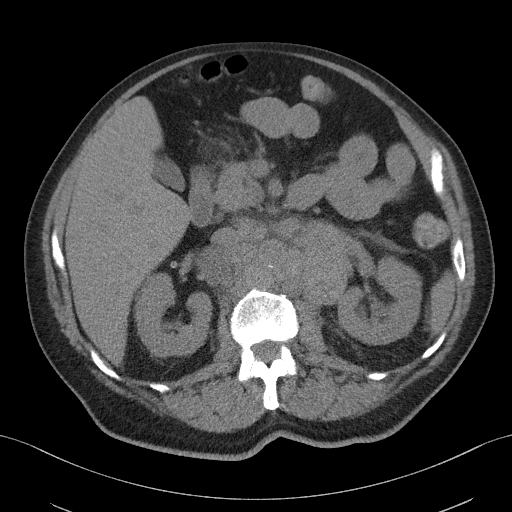
[im 65/82  soft-tissue]
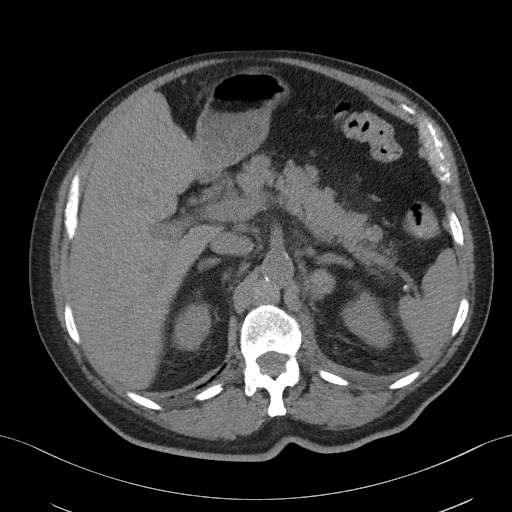
[im 72/82  soft-tissue]
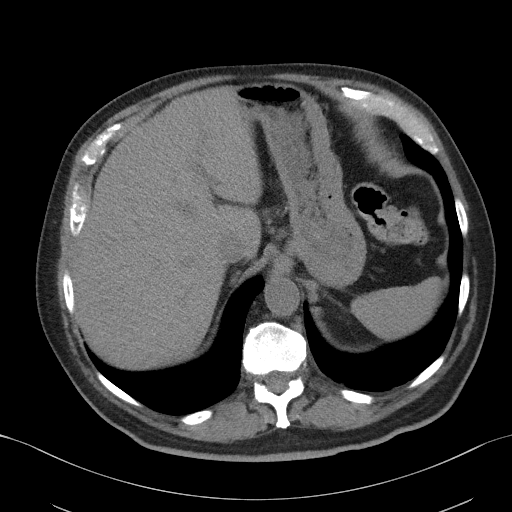
[im 78/82  soft-tissue]
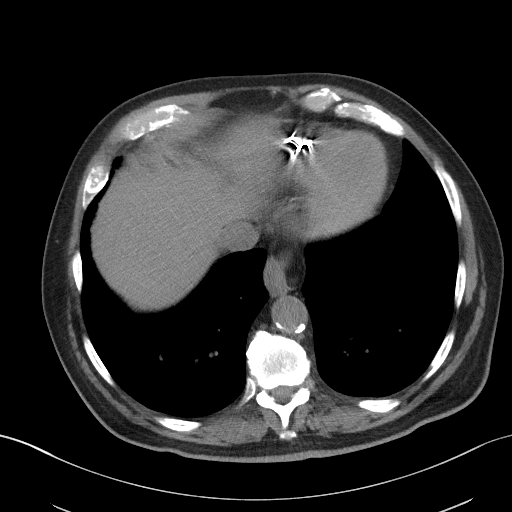

[Series 4: coronal st · coronal · 0.73mm/px · 3 of 103 slices shown]
[im 35/103  soft-tissue]
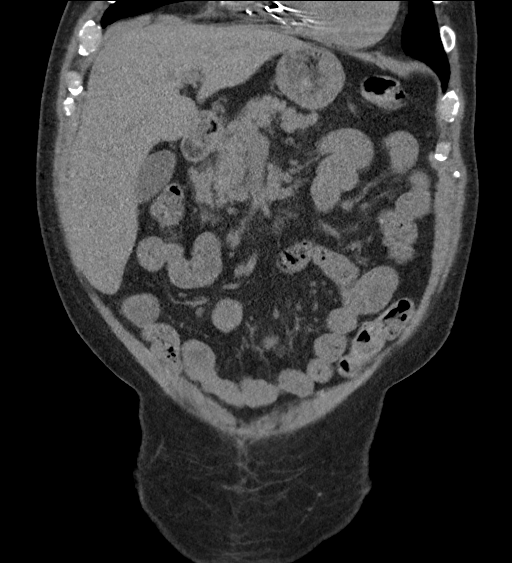
[im 46/103  soft-tissue]
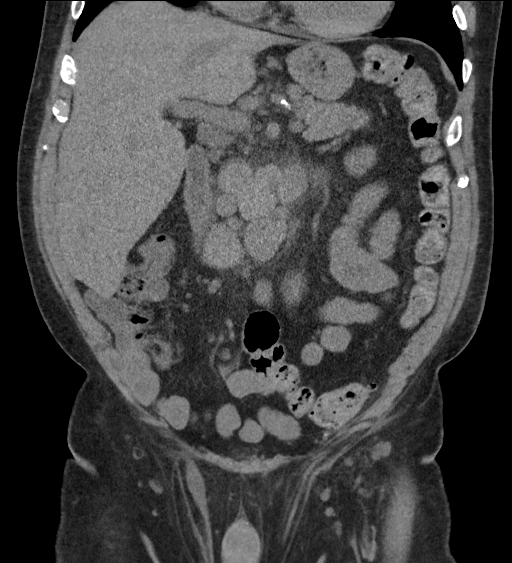
[im 57/103  soft-tissue]
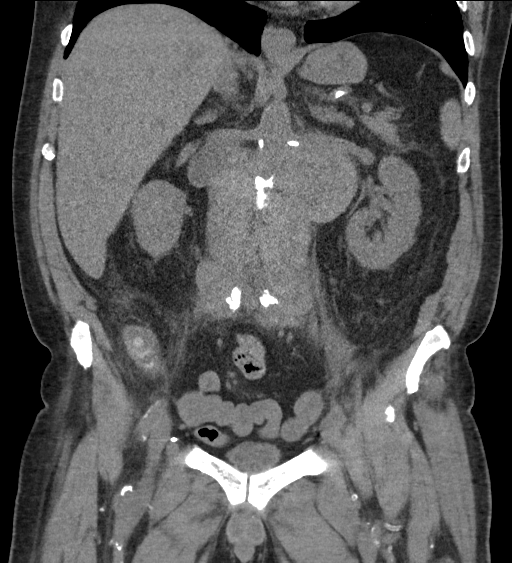

[16 of 46 positions shown; findings below may reference images not displayed]

FINDINGS: Lower chest: Lung bases are clear.

Hepatobiliary: No focal hepatic lesion. No biliary duct dilatation.
Gallbladder is normal. Common bile duct is normal.

Pancreas: Pancreas is normal. No ductal dilatation. No pancreatic
inflammation.

Spleen: Normal spleen

Adrenals/urinary tract: Adrenal glands normal. Two nonobstructing
calculi in the LEFT kidney measuring 4 mm each. Punctate
calcification in the RIGHT kidney measuring 1 mm. No
ureterolithiasis or obstructive uropathy. Bladder normal.

Stomach/Bowel: Stomach, small-bowel and cecum are normal. The
appendix is not identified but there is no pericecal inflammation to
suggest appendicitis. The colon and rectosigmoid colon are normal.

Vascular/Lymphatic: Calcification of the abdominal aorta. Normal
caliber.

There is bulky retroperitoneal periaortic lymphadenopathy which
extends to the common iliac vessels. Lymph nodes surround the aorta
and IVC. Lymph node mass on the LEFT at the level of the kidney
measures 5.7 x 5.6 cm. LEFT common iliac lymph node measures 2.6 cm
(image 43/2). LEFT external iliac lymph node measures 2.8 cm.

No inguinal adenopathy.

Reproductive: Post prostatectomy

Other: No free fluid.

Musculoskeletal: No aggressive osseous lesion.
IMPRESSION: 1. Bulky retroperitoneal periaortic lymphadenopathy extending to the
iliac nodal chains most consistent with LYMPHOMA.
2. Most inferior lymph node is a LEFT external iliac lymph node.
3. Normal volume spleen.
4. Bilateral nephrolithiasis.
5.  Aortic Atherosclerosis ([id number].W).

Findings conveyed [place] on 02/26/2020  at[DATE].

## 2020-07-03 NOTE — Telephone Encounter (Signed)
Home monitor is up to date as of 06/29/20.  Encounter closed.

## 2020-07-04 ENCOUNTER — Encounter: Payer: Self-pay | Admitting: Hematology & Oncology

## 2020-07-04 ENCOUNTER — Encounter: Payer: Self-pay | Admitting: *Deleted

## 2020-07-04 ENCOUNTER — Inpatient Hospital Stay: Payer: HMO

## 2020-07-04 ENCOUNTER — Other Ambulatory Visit: Payer: Self-pay

## 2020-07-04 ENCOUNTER — Inpatient Hospital Stay: Payer: HMO | Attending: Hematology & Oncology | Admitting: Hematology & Oncology

## 2020-07-04 VITALS — BP 127/53 | HR 77 | Temp 97.9°F | Resp 19 | Wt 167.0 lb

## 2020-07-04 DIAGNOSIS — C642 Malignant neoplasm of left kidney, except renal pelvis: Secondary | ICD-10-CM | POA: Diagnosis not present

## 2020-07-04 DIAGNOSIS — D509 Iron deficiency anemia, unspecified: Secondary | ICD-10-CM | POA: Diagnosis not present

## 2020-07-04 DIAGNOSIS — R739 Hyperglycemia, unspecified: Secondary | ICD-10-CM | POA: Diagnosis not present

## 2020-07-04 DIAGNOSIS — Z7984 Long term (current) use of oral hypoglycemic drugs: Secondary | ICD-10-CM | POA: Diagnosis not present

## 2020-07-04 DIAGNOSIS — I509 Heart failure, unspecified: Secondary | ICD-10-CM | POA: Diagnosis not present

## 2020-07-04 DIAGNOSIS — Z79899 Other long term (current) drug therapy: Secondary | ICD-10-CM | POA: Insufficient documentation

## 2020-07-04 DIAGNOSIS — K909 Intestinal malabsorption, unspecified: Secondary | ICD-10-CM | POA: Insufficient documentation

## 2020-07-04 DIAGNOSIS — C8203 Follicular lymphoma grade I, intra-abdominal lymph nodes: Secondary | ICD-10-CM | POA: Diagnosis not present

## 2020-07-04 DIAGNOSIS — C778 Secondary and unspecified malignant neoplasm of lymph nodes of multiple regions: Secondary | ICD-10-CM | POA: Diagnosis not present

## 2020-07-04 DIAGNOSIS — F0390 Unspecified dementia without behavioral disturbance: Secondary | ICD-10-CM | POA: Diagnosis not present

## 2020-07-04 DIAGNOSIS — Z7982 Long term (current) use of aspirin: Secondary | ICD-10-CM | POA: Diagnosis not present

## 2020-07-04 DIAGNOSIS — D500 Iron deficiency anemia secondary to blood loss (chronic): Secondary | ICD-10-CM

## 2020-07-04 LAB — CMP (CANCER CENTER ONLY)
ALT: 14 U/L (ref 0–44)
AST: 19 U/L (ref 15–41)
Albumin: 3.3 g/dL — ABNORMAL LOW (ref 3.5–5.0)
Alkaline Phosphatase: 51 U/L (ref 38–126)
Anion gap: 8 (ref 5–15)
BUN: 23 mg/dL (ref 8–23)
CO2: 27 mmol/L (ref 22–32)
Calcium: 9.3 mg/dL (ref 8.9–10.3)
Chloride: 107 mmol/L (ref 98–111)
Creatinine: 0.8 mg/dL (ref 0.61–1.24)
GFR, Est AFR Am: >60 mL/min (ref >60)
GFR, Estimated: >60 mL/min (ref >60)
Glucose, Bld: 183 mg/dL — ABNORMAL HIGH (ref 70–99)
Potassium: 3.8 mmol/L (ref 3.5–5.1)
Sodium: 142 mmol/L (ref 135–145)
Total Bilirubin: 0.2 mg/dL — ABNORMAL LOW (ref 0.3–1.2)
Total Protein: 6 g/dL — ABNORMAL LOW (ref 6.5–8.1)

## 2020-07-04 LAB — CBC WITH DIFFERENTIAL (CANCER CENTER ONLY)
Abs Immature Granulocytes: 0.05 10*3/uL (ref 0.00–0.07)
Basophils Absolute: 0.1 10*3/uL (ref 0.0–0.1)
Basophils Relative: 1 %
Eosinophils Absolute: 0.6 10*3/uL — ABNORMAL HIGH (ref 0.0–0.5)
Eosinophils Relative: 8 %
HCT: 27.4 % — ABNORMAL LOW (ref 39.0–52.0)
Hemoglobin: 8.5 g/dL — ABNORMAL LOW (ref 13.0–17.0)
Immature Granulocytes: 1 %
Lymphocytes Relative: 9 %
Lymphs Abs: 0.7 10*3/uL (ref 0.7–4.0)
MCH: 28.9 pg (ref 26.0–34.0)
MCHC: 31 g/dL (ref 30.0–36.0)
MCV: 93.2 fL (ref 80.0–100.0)
Monocytes Absolute: 0.6 10*3/uL (ref 0.1–1.0)
Monocytes Relative: 9 %
Neutro Abs: 5.5 10*3/uL (ref 1.7–7.7)
Neutrophils Relative %: 72 %
Platelet Count: 368 10*3/uL (ref 150–400)
RBC: 2.94 MIL/uL — ABNORMAL LOW (ref 4.22–5.81)
RDW: 20.6 % — ABNORMAL HIGH (ref 11.5–15.5)
WBC Count: 7.5 10*3/uL (ref 4.0–10.5)
nRBC: 0.3 % — ABNORMAL HIGH (ref 0.0–0.2)

## 2020-07-04 MED ORDER — SODIUM CHLORIDE 0.9% FLUSH
10.0000 mL | Freq: Once | INTRAVENOUS | Status: AC | PRN
  Administered 2020-07-04: 10 mL
  Filled 2020-07-04: qty 10

## 2020-07-04 MED ORDER — SODIUM CHLORIDE 0.9 % IV SOLN
Freq: Once | INTRAVENOUS | Status: DC
  Filled 2020-07-04: qty 250

## 2020-07-04 MED ORDER — HEPARIN SOD (PORK) LOCK FLUSH 100 UNIT/ML IV SOLN
500.0000 [IU] | Freq: Once | INTRAVENOUS | Status: AC | PRN
  Administered 2020-07-04: 500 [IU]
  Filled 2020-07-04: qty 5

## 2020-07-04 NOTE — Progress Notes (Signed)
Visited with patient, his wife and daughter, Kenneth Lyons, prior to his appointment with Dr Marin Olp. Patient is doing well today. His wounds from his previous fall have healed. He is very confused, but pleasant. His wife and daughter acknowledge his dementia is significantly worse, which is the purpose of today's goals of care appointment.   Oncology Nurse Navigator Documentation  Oncology Nurse Navigator Flowsheets 07/04/2020  Abnormal Finding Date -  Diagnosis Status -  Planned Course of Treatment -  Phase of Treatment -  Chemotherapy Pending- Reason: -  Chemotherapy Actual Start Date: -  Navigator Follow Up Date: 07/12/2020  Navigator Follow Up Reason: Follow-up Appointment  Navigator Location [facility]  Referral Date to RadOnc/MedOnc -  Navigator Encounter Type Follow-up Appt  Telephone -  Treatment Initiated Date -  Patient Visit Type MedOnc  Treatment Phase Active Tx  Barriers/Navigation Needs Anxiety;Family Concerns;Morbidities/Frailty  Education -  Interventions Psycho-Social Support  Acuity Level 2-Minimal Needs (1-2 Barriers Identified)  Referrals -  Coordination of Care -  Education Method -  Support Groups/Services Friends and Family  Time Spent with Patient 38

## 2020-07-04 NOTE — Patient Instructions (Signed)

## 2020-07-04 NOTE — Progress Notes (Signed)
Hematology and Oncology Follow Up Visit  Kenneth Lyons [id number] August 22, 1941 79 y.o. 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  Unfortunately, he is having more problems with dementia.  He has had to go to the emergency room.  He really was having problems with his memory and with orientation.  He has had a hard time at home.  Daughter came down from [place].  She is really helped out quite a bit.  He is starting to get better.  Today, he really looks pretty much baseline.  He has baseline dementia.  This does not appear to be any worse.  I think the real issue is him declining after each treatment.  He is getting harder for his wife to try to take care of him.  As such, I think we probably need to forego any further therapy.  I realize that he has a disease that is treatable but not curable.  I really do not think we have to push treatment at this point.  This is all about quality of life.  I think that if we find that his cancer is progressing, I would think that his anemia would become worse again.  This was a real problem we first saw him.  He does have some elevated blood sugars.  I really do not think that should be a problem to restrict his diet.  He should be able to have what he likes.  He has had no problems with pain.  There is no cough or shortness of breath.  He has congestive heart failure.  This does not appear to have any episodes of  exacerbations.  We talked a lot about quality of life.  I will have to see if we can somehow get Hospice's Palliative Care section to see if they can help out.  I would have to say that overall, his performance status is probably ECOG 2-3.     Medications:  Current Outpatient Medications:  .  alfuzosin (UROXATRAL) 10 MG 24 hr tablet, Take 10 mg by mouth daily with breakfast. , Disp: , Rfl:  .  ASPIRIN 81 PO, Take 81 mg by mouth daily., Disp: , Rfl:  .  Calcium-Magnesium-Vitamin D (CALCIUM 1200+D3 PO), Take 1 tablet by mouth daily., Disp: , Rfl:  .  carvedilol (COREG) 25 MG tablet, Take 25 mg by mouth 2 (two) times daily with a meal. , Disp: , Rfl:  .  cetirizine (ZYRTEC) 10 MG tablet, Take 10 mg by mouth daily. (Patient not taking: Reported on 07/04/2020), Disp: , Rfl:  .  Cholecalciferol (VITAMIN D-1000 MAX ST) 25 MCG (1000 UT) tablet, Take 1 capsule by mouth daily., Disp: , Rfl:  .  Cyanocobalamin 5000 MCG SUBL, Place 5,000 mcg under the tongue daily. Place one dose under the tongue daily , Disp: , Rfl:  .  Dulaglutide (TRULICITY) 0.17 CB/4.4HQ SOPN, Inject 0.75 mg into the skin every Wednesday. , Disp: , Rfl:  .  escitalopram (LEXAPRO) 20 MG tablet, Take 1 tablet (20 mg total) by mouth daily., Disp: 30 tablet, Rfl: 3 .  fentaNYL (DURAGESIC) 25 MCG/HR, Place 1 patch onto the skin every 3 (three) days., Disp: 10 patch, Rfl: 0 .  furosemide (LASIX) 20 MG tablet, Take 1 tablet (20 mg total) by mouth daily., Disp: 90 tablet, Rfl: 3 .  gabapentin (NEURONTIN) 100 MG capsule, Take 100 mg by mouth at bedtime as needed (pain). , Disp: , Rfl:  .  glipiZIDE (GLUCOTROL) 10 MG tablet, Take 10 mg by mouth daily before breakfast. Take with glipizide 5 mg to equal 15 mg, Disp: , Rfl:  .  glipiZIDE (GLUCOTROL) 5 MG tablet, Take 5 mg by mouth daily before breakfast. Take with glipizide 10 mg to equal 15 mg (Patient not taking: Reported on 07/04/2020), Disp: , Rfl:  .  Glucosamine-Chondroitin 250-200 MG TABS,  Take 1 tablet by mouth 2 (two) times daily., Disp: , Rfl:  .  HYDROcodone-acetaminophen (NORCO) 7.5-325 MG tablet, Take 1 tablet by mouth every 6 (six) hours as needed for moderate pain., Disp: 60 tablet, Rfl: 0 .  levothyroxine (SYNTHROID) 50 MCG tablet, Take 1 tablet (50 mcg total) by mouth daily before breakfast., Disp: 30 tablet, Rfl: 4 .  memantine (NAMENDA) 10 MG tablet, Take 10 mg by mouth 2 (two) times daily. , Disp: , Rfl:  .  metFORMIN (GLUCOPHAGE) 1000 MG tablet, Take 1,000 mg by mouth 2 (two) times daily with a meal. , Disp: , Rfl:  .  Multiple Vitamin (MULTIVITAMIN) tablet, Take 1 tablet by mouth daily., Disp: , Rfl:  .  Omega-3 Fatty Acids (FISH OIL PO), Take 2 capsules by mouth daily., Disp: , Rfl:  .  ONETOUCH VERIO test strip, 1 each daily., Disp: , Rfl:  .  rosuvastatin (CRESTOR) 20 MG tablet, Take 20 mg by mouth daily., Disp: , Rfl:  .  sacubitril-valsartan (ENTRESTO) 49-51 MG, Take 1 tablet by mouth 2 (two) times daily., Disp: 60 tablet, Rfl: 11 .  selenium 50 MCG TABS, Take 50 mcg by mouth daily., Disp: , Rfl:  No current facility-administered medications for this visit.  Facility-Administered Medications Ordered in Other Visits:  .  0.9 %  sodium chloride infusion, , Intravenous, Once, [place], Holli Humbles, NP  Allergies: No Known Allergies  Past Medical History, Surgical history, Social history, and Family History were reviewed and updated.  Review of Systems: Review of Systems  Constitutional: Positive for appetite change, fatigue and unexpected weight change.  HENT:  Negative.   Eyes: Negative.   Respiratory: Positive for shortness of breath.   Cardiovascular: Positive for leg swelling.  Gastrointestinal: Positive for diarrhea and nausea.  Endocrine: Negative.   Genitourinary: Negative.    Musculoskeletal: Positive for back pain and neck pain.  Skin: Positive for rash.  Neurological: Negative.   Hematological: Negative.   Psychiatric/Behavioral: Negative.      Physical Exam:  weight is 167 lb (75.8 kg). His oral temperature is 97.9 F (36.6 C). His blood pressure is 127/53 (abnormal) and his pulse is 77. His respiration is 19 and oxygen saturation is 99%.   Wt Readings from Last 3 Encounters:  07/04/20 167 lb (75.8 kg)  06/18/20 160 lb (72.6 kg)  06/15/20 165 lb (74.8 kg)    Physical Exam Vitals reviewed.  Constitutional:      Comments: This is a pale appearing elderly looking white male.  He is is in no obvious distress.  HENT:     Head: Normocephalic and  atraumatic.  Eyes:     Pupils: Pupils are equal, round, and reactive to light.  Cardiovascular:     Rate and Rhythm: Normal rate and regular rhythm.     Heart sounds: Normal heart sounds.     Comments: Cardiac exam shows a regular rate and rhythm.  He has a 1/6 systolic ejection murmur. Pulmonary:     Effort: Pulmonary effort is normal.     Breath sounds: Normal breath sounds.  Abdominal:     General: Bowel sounds are normal.     Palpations: Abdomen is soft.  Musculoskeletal:        General: No tenderness or deformity. Normal range of motion.     Cervical back: Normal range of motion.     Comments: His extremities shows swelling of the left arm and left leg.  This is pitting edema in both upper and lower extremities.  He has some weakness in both arms and legs.  He has decent range of motion.  He has decent pulses in his distal extremities.  Lymphadenopathy:     Cervical: No cervical adenopathy.  Skin:    General: Skin is warm and dry.     Findings: No erythema or rash.     Comments: He does have a mild erythematous rash in both inguinal areas.  I do not see any obvious exudate.  There is no open areas of skin.  Neurological:     Mental Status: He is alert and oriented to person, place, and time.  Psychiatric:        Behavior: Behavior normal.        Thought Content: Thought content normal.        Judgment: Judgment normal.      Lab Results  Component Value Date    WBC 7.5 07/04/2020   HGB 8.5 (L) 07/04/2020   HCT 27.4 (L) 07/04/2020   MCV 93.2 07/04/2020   PLT 368 07/04/2020     Chemistry      Component Value Date/Time   NA 142 07/04/2020 1510   NA 139 04/10/2020 1157   K 3.8 07/04/2020 1510   CL 107 07/04/2020 1510   CO2 27 07/04/2020 1510   BUN 23 07/04/2020 1510   BUN 32 (H) 04/10/2020 1157   CREATININE 0.80 07/04/2020 1510      Component Value Date/Time   CALCIUM 9.3 07/04/2020 1510   ALKPHOS 51 07/04/2020 1510   AST 19 07/04/2020 1510   ALT 14 07/04/2020 1510   BILITOT 0.2 (L) 07/04/2020 1510      Impression and Plan: Kenneth Lyons is a very nice 79 year old white male.  He has metastatic renal cell carcinoma.  Thankfully, the renal cell carcinoma is only in lymph nodes and not in the organs.  I just want quality of life to be our goal.  Again, we have a disease that we cannot cure.  For right now, we are going to see about another scan on him.  I will just get a CT scan on him.  I think this will tell us how things are going.  I know that sometimes with immunotherapy, we can see if prolonged response.  His last scan which was actually a PET scan done in mid August, showed a essentially stable disease.  I just feel bad that Kenneth Lyons has had such a tough time.  I know this can happen with immunotherapy.  This is truly a rarity.  We will try to get the CT scan same day  that we see him.  We will have to watch for his anemia.  We will have to check iron studies.  I spent over 45 minutes with the and his family today.     Volanda Napoleon, MD 9/15/20215:06 PM

## 2020-07-05 ENCOUNTER — Telehealth: Payer: Self-pay | Admitting: Hematology & Oncology

## 2020-07-05 ENCOUNTER — Encounter: Payer: Self-pay | Admitting: *Deleted

## 2020-07-05 ENCOUNTER — Telehealth: Payer: Self-pay | Admitting: Internal Medicine

## 2020-07-05 ENCOUNTER — Telehealth: Payer: Self-pay | Admitting: *Deleted

## 2020-07-05 LAB — LACTATE DEHYDROGENASE: LDH: 275 U/L — ABNORMAL HIGH (ref 98–192)

## 2020-07-05 NOTE — Telephone Encounter (Signed)
Palliative Care consult called to [facility] per order of Dr. Marin Olp.

## 2020-07-05 NOTE — Progress Notes (Signed)
Reviewing patient visit, patient will need a referral to Palliative Care, which was called in by the desk RN. He also needs a CT scan the same day as his follow up appointment. Spoke with Radiology downstairs and appointment scheduled. Follow up appointment with this office also rescheduled per MD instruction.   Oncology Nurse Navigator Documentation  Oncology Nurse Navigator Flowsheets 07/05/2020  Abnormal Finding Date -  Diagnosis Status -  Planned Course of Treatment -  Phase of Treatment -  Chemotherapy Pending- Reason: -  Chemotherapy Actual Start Date: -  Navigator Follow Up Date: 08/02/2020  Navigator Follow Up Reason: Follow-up Appointment;Radiology  Navigator Location [facility]  Referral Date to RadOnc/MedOnc -  Navigator Encounter Type Appt/Treatment Plan Review  Telephone -  Treatment Initiated Date -  Patient Visit Type MedOnc  Treatment Phase Post-Tx Follow-up  Barriers/Navigation Needs Anxiety;Family Concerns;Morbidities/Frailty  Education -  Interventions Coordination of Care  Acuity Level 2-Minimal Needs (1-2 Barriers Identified)  Referrals -  Coordination of Care Appts;Radiology  Education Method -  Support Groups/Services Friends and Family  Time Spent with Patient 30

## 2020-07-05 NOTE — Progress Notes (Signed)
Received request from the patient's family to move palliative care referral to [facility]. They are familiar with that program and wish to use that service. Called and spoke to [place] and gave her information regarding the referral. She will reach out to the family tomorrow and arrange a visit.  Called Erline Levine with [facility] and cancelled referral request with that agency.  Oncology Nurse Navigator Documentation  Oncology Nurse Navigator Flowsheets 07/05/2020  Abnormal Finding Date -  Diagnosis Status -  Planned Course of Treatment -  Phase of Treatment -  Chemotherapy Pending- Reason: -  Chemotherapy Actual Start Date: -  Navigator Follow Up Date: 08/02/2020  Navigator Follow Up Reason: Follow-up Appointment  Navigator Location [facility]  Referral Date to RadOnc/MedOnc -  Navigator Encounter Type Telephone  Telephone Incoming Call  Treatment Initiated Date -  Patient Visit Type MedOnc  Treatment Phase Post-Tx Follow-up  Barriers/Navigation Needs Anxiety;Family Concerns;Morbidities/Frailty  Education -  Interventions Referrals  Acuity Level 2-Minimal Needs (1-2 Barriers Identified)  Referrals Home Health  Coordination of Care -  Education Method -  Support Groups/Services Friends and Family  Time Spent with Patient 30

## 2020-07-05 NOTE — Telephone Encounter (Signed)
Scheduled [facility] Palliative visit for 07-17-20 at 9:30.

## 2020-07-05 NOTE — Telephone Encounter (Signed)
Called and spoke with wife regarding appointments that have been cancelled & rescheduled per 9/15 los

## 2020-07-11 ENCOUNTER — Other Ambulatory Visit: Payer: Self-pay

## 2020-07-11 NOTE — Patient Outreach (Signed)
  [facility]) Care Management Chronic Special Needs Program    07/11/2020  Name: Khalel Alms, DOB: [birth year]  MRN: [id number]   Mr. Knowledge Escandon is enrolled in a chronic special needs plan for Diabetes. Telephone call to client's wife, Galan Ghee. Unable to reach. HIPAA compliant voice message left with call back phone number and return call request.  PLAN; RNCM will attempt 2nd telephone outreach to client in 2 weeks.   Quinn Plowman RN,BSN,CCM [facility] Care Management [phone]

## 2020-07-11 NOTE — Patient Outreach (Addendum)
  [facility]) Care Management Chronic Special Needs Program   07/11/2020  Name: Kenneth Lyons, DOB: 09-Mar-1941  MRN: [id number]  The client was discussed in today's interdisciplinary care team meeting.  The following issues were discussed:  Client's needs, Changes in health status, Key risk triggers/risk stratification, Care Plan, Coordination of care and Issues/barriers to care  Interdisciplinary Care team: Bary Castilla, BSN, MS, CCM Dr. Hollice Espy BSN, CCM Melissa Sandlin BSN, CCM, CDE Jacqlyn Larsen, RN, BSN Thea Silversmith, BSN, MSN, CCM [facility], [facility]/ CMAA Babs Bertin, RN - [place], CDE, Health Coach HTA Janey Genta, RN, BSN La Leanord Hawking RN, BSN Siobhan Jerelene Redden, Mudlogger of Quality, HTA Linus Orn, Government social research officer, CSNP/ HTA Daisy Blossom, Landscape architect, HTA    Plan:  RNCM will continue care coordination and collaboration with the [facility] team. RNCM will continue to follow as clients CSNP care management coordinator.    Quinn Plowman RN,BSN,CCM [facility] [phone]

## 2020-07-12 ENCOUNTER — Other Ambulatory Visit: Payer: HMO

## 2020-07-12 ENCOUNTER — Ambulatory Visit: Payer: HMO | Admitting: Hematology & Oncology

## 2020-07-12 ENCOUNTER — Ambulatory Visit: Payer: HMO

## 2020-07-12 DIAGNOSIS — I5042 Chronic combined systolic (congestive) and diastolic (congestive) heart failure: Secondary | ICD-10-CM | POA: Diagnosis not present

## 2020-07-12 DIAGNOSIS — F0280 Dementia in other diseases classified elsewhere without behavioral disturbance: Secondary | ICD-10-CM | POA: Diagnosis not present

## 2020-07-12 DIAGNOSIS — G309 Alzheimer's disease, unspecified: Secondary | ICD-10-CM | POA: Diagnosis not present

## 2020-07-12 DIAGNOSIS — I130 Hypertensive heart and chronic kidney disease with heart failure and stage 1 through stage 4 chronic kidney disease, or unspecified chronic kidney disease: Secondary | ICD-10-CM | POA: Diagnosis not present

## 2020-07-17 ENCOUNTER — Other Ambulatory Visit: Payer: Self-pay | Admitting: Internal Medicine

## 2020-07-22 IMAGING — DX DG CHEST 1V PORT
1 series · 1 of 1 positions shown · non-contrast
Comparison: Chest radiograph dated 02/10/2020.

CLINICAL DATA: 78-year-old male with shortness of breath.

EXAM:
PORTABLE CHEST 1 VIEW

[chest ap]
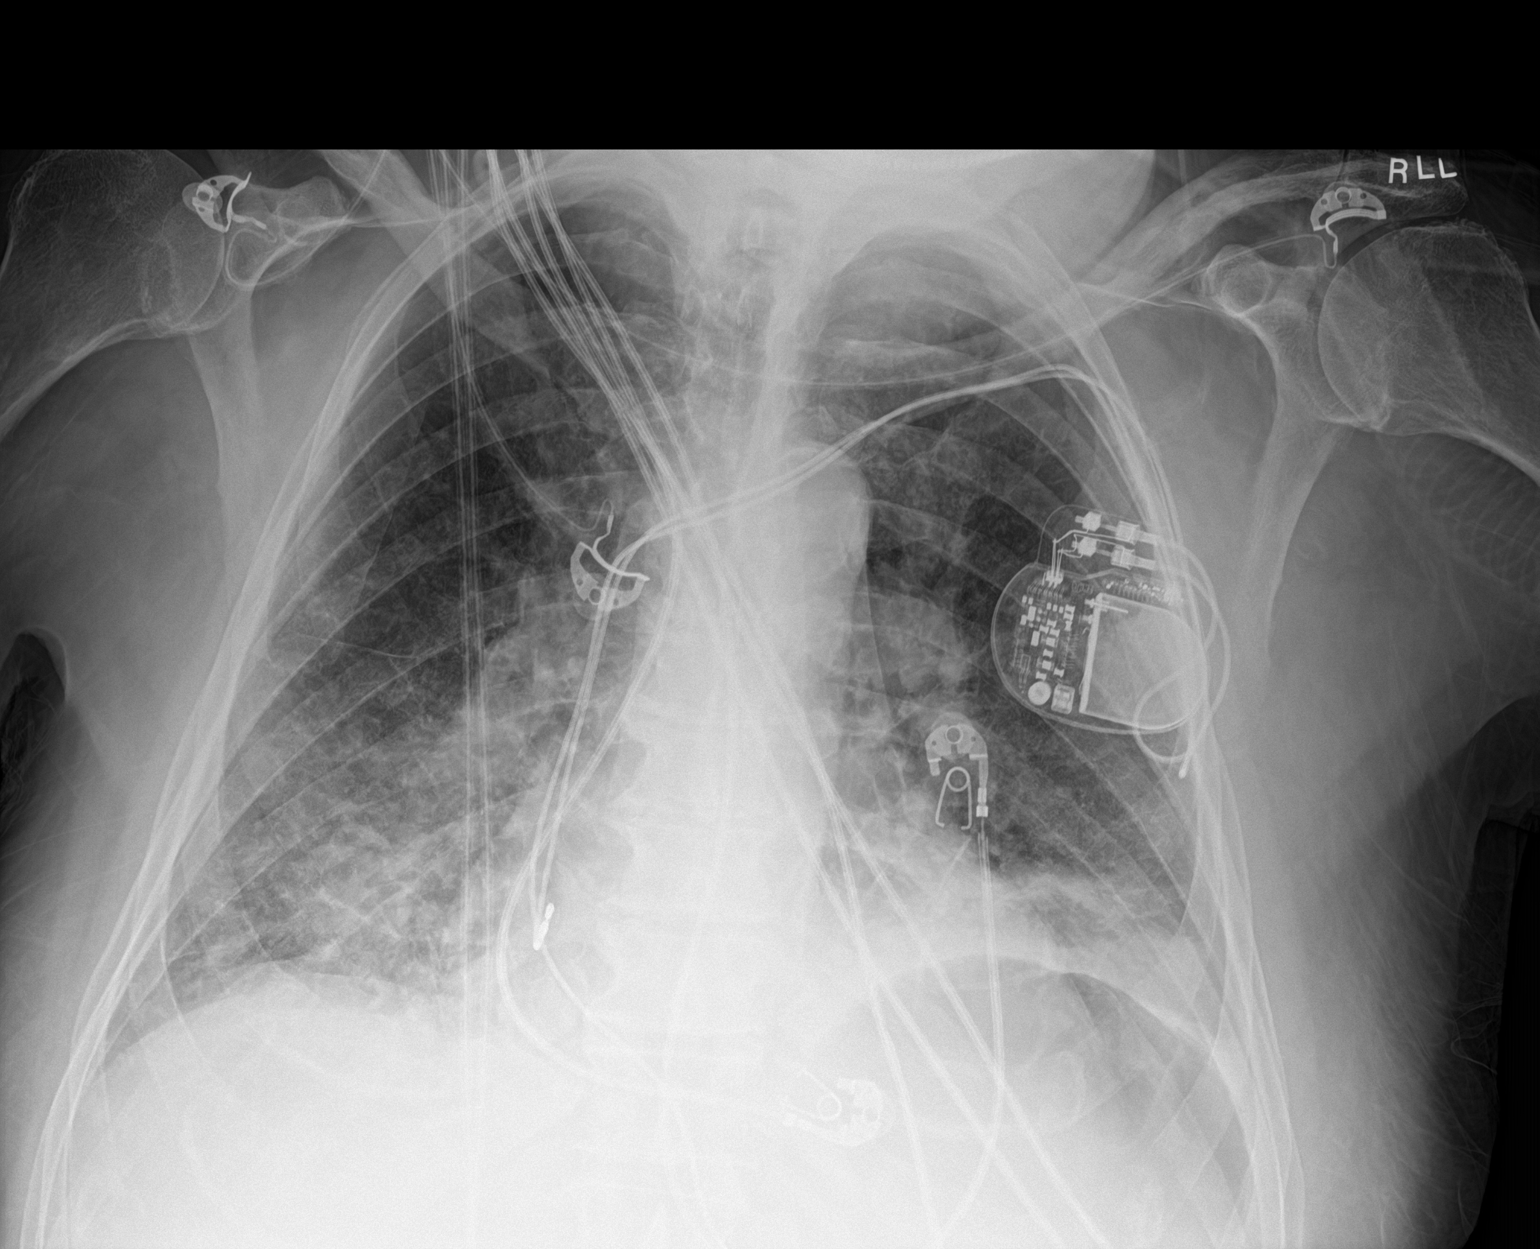

[1 of 1 positions shown; findings below may reference images not displayed]

FINDINGS: Bilateral lower lung field airspace densities concerning for
developing infiltrate. Clinical correlation is recommended. Trace
left pleural effusion may be present. No pneumothorax. Stable
cardiomediastinal silhouette. Left pectoral pacemaker device. No
acute osseous pathology.
IMPRESSION: Bilateral lower lung field airspace densities concerning for
developing infiltrate. Clinical correlation and follow-up
recommended.

## 2020-07-23 DIAGNOSIS — I639 Cerebral infarction, unspecified: Secondary | ICD-10-CM | POA: Diagnosis not present

## 2020-07-23 DIAGNOSIS — C778 Secondary and unspecified malignant neoplasm of lymph nodes of multiple regions: Secondary | ICD-10-CM | POA: Diagnosis not present

## 2020-07-23 DIAGNOSIS — C649 Malignant neoplasm of unspecified kidney, except renal pelvis: Secondary | ICD-10-CM | POA: Diagnosis not present

## 2020-07-23 DIAGNOSIS — L8990 Pressure ulcer of unspecified site, unspecified stage: Secondary | ICD-10-CM | POA: Diagnosis not present

## 2020-07-23 DIAGNOSIS — I5023 Acute on chronic systolic (congestive) heart failure: Secondary | ICD-10-CM | POA: Diagnosis not present

## 2020-07-23 DIAGNOSIS — G301 Alzheimer's disease with late onset: Secondary | ICD-10-CM | POA: Diagnosis not present

## 2020-07-23 IMAGING — DX DG CHEST 1V PORT
1 series · 1 of 1 positions shown · non-contrast
Comparison: 03/16/2020

CLINICAL DATA: Fevers

EXAM:
PORTABLE CHEST 1 VIEW

[chest ap]
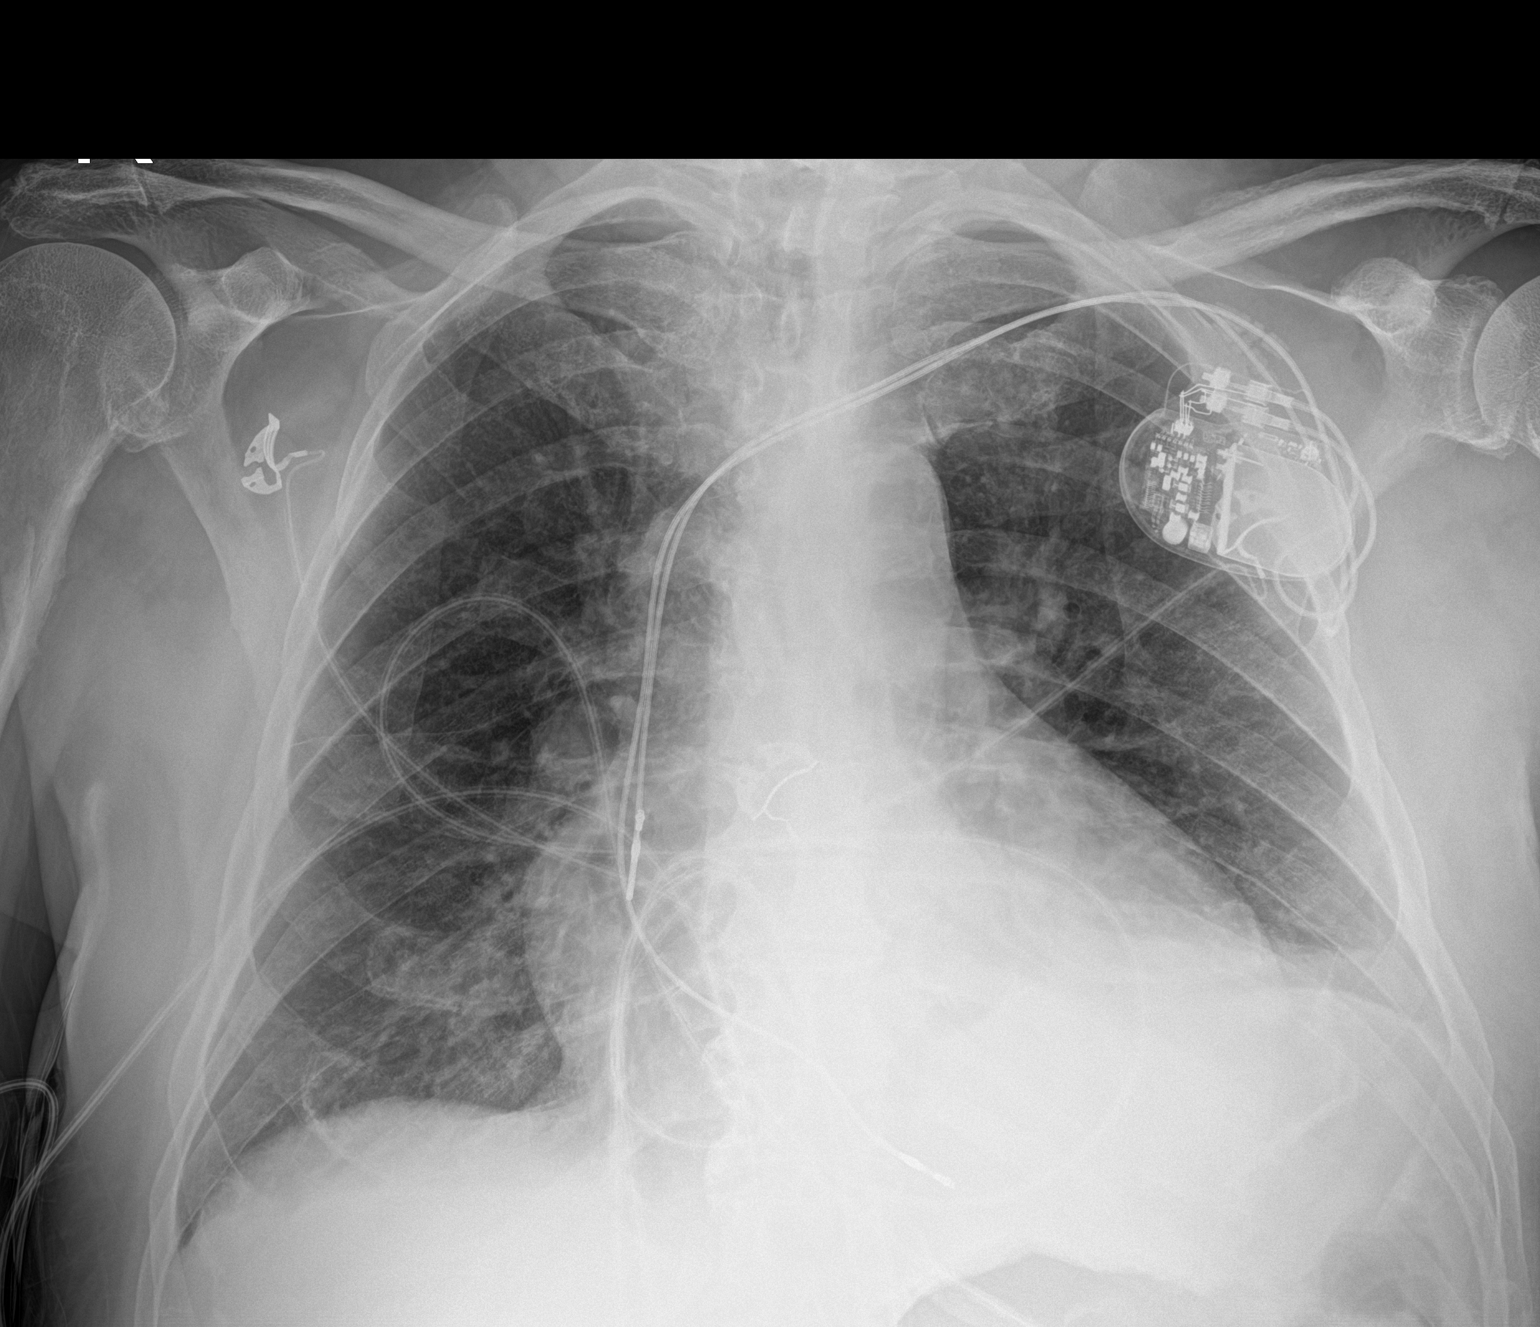

[1 of 1 positions shown; findings below may reference images not displayed]

FINDINGS: Cardiac shadow is enlarged but stable. Pacing device is again seen.
The patchy airspace opacity seen in the bases bilaterally have
improved significantly although residual is noted primarily within
the left lung base. No other focal abnormality is noted. No bony
abnormality is seen.
IMPRESSION: Significant improved aeration when compared with the prior exam.
Residual left basilar opacity is noted.

## 2020-07-25 DIAGNOSIS — Z86008 Personal history of in-situ neoplasm of other site: Secondary | ICD-10-CM | POA: Diagnosis not present

## 2020-07-25 DIAGNOSIS — D0439 Carcinoma in situ of skin of other parts of face: Secondary | ICD-10-CM | POA: Diagnosis not present

## 2020-07-25 DIAGNOSIS — L570 Actinic keratosis: Secondary | ICD-10-CM | POA: Diagnosis not present

## 2020-07-25 DIAGNOSIS — L910 Hypertrophic scar: Secondary | ICD-10-CM | POA: Diagnosis not present

## 2020-07-25 DIAGNOSIS — Z85828 Personal history of other malignant neoplasm of skin: Secondary | ICD-10-CM | POA: Diagnosis not present

## 2020-07-26 IMAGING — US IR IMAGING GUIDED PORT INSERTION
1 series · 1 of 1 positions shown · non-contrast
Comparison: none

CLINICAL DATA: METASTATIC RENAL CELL CARCINOMA

[Series 1: ir imaging guided port insertion · 1 of 1 slices shown]
[im 1/1]
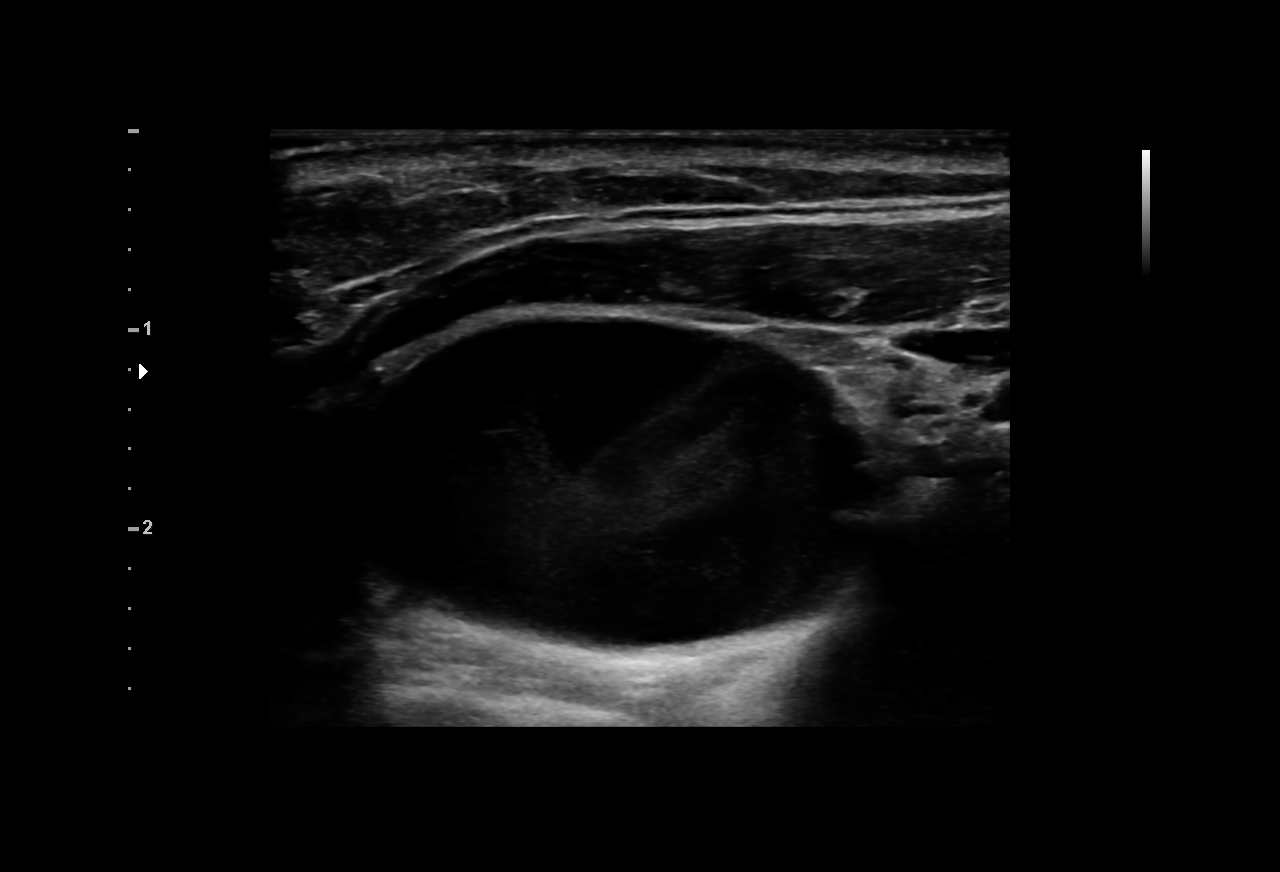

[1 of 1 positions shown; findings below may reference images not displayed]

EXAM:
RIGHT INTERNAL JUGULAR SINGLE LUMEN POWER PORT CATHETER INSERTION

Date:  03/20/2020 03/20/2020 [DATE]

Radiologist:  Ibadzade, Nurlane

Guidance:  Ultrasound and fluoroscopic

MEDICATIONS:
Patient is already receiving IV antibiotics as an inpatient

ANESTHESIA/SEDATION:
Versed 1.0 mg IV; Fentanyl 50 mcg IV;

Moderate Sedation Time:  29 minutes

The patient was continuously monitored during the procedure by the
interventional radiology nurse under my direct supervision.

FLUOROSCOPY TIME:  0 minutes, 42 seconds (3 mGy)

COMPLICATIONS:
None immediate.

CONTRAST:  None.

PROCEDURE:
Informed consent was obtained from the patient following explanation
of the procedure, risks, benefits and alternatives. The patient
understands, agrees and consents for the procedure. All questions
were addressed. A time out was performed.

Maximal barrier sterile technique utilized including caps, mask,
sterile gowns, sterile gloves, large sterile drape, hand hygiene,
and 2% chlorhexidine scrub.

Under sterile conditions and local anesthesia, right internal
jugular micropuncture venous access was performed. Access was
performed with ultrasound. Images were obtained for documentation of
the patent right internal jugular vein. A guide wire was inserted
followed by a transitional dilator. This allowed insertion of a
guide wire and catheter into the IVC. Measurements were obtained
from the SVC / RA junction back to the right IJ venotomy site. In
the right infraclavicular chest, a subcutaneous pocket was created
over the second anterior rib. This was done under sterile conditions
and local anesthesia. 1% lidocaine with epinephrine was utilized for
this. A 2.5 cm incision was made in the skin. Blunt dissection was
performed to create a subcutaneous pocket over the right pectoralis
major muscle. The pocket was flushed with saline vigorously. There
was adequate hemostasis. The port catheter was assembled and checked
for leakage. The port catheter was secured in the pocket with two
retention sutures. The tubing was tunneled subcutaneously to the
right venotomy site and inserted into the SVC/RA junction through a
valved peel-away sheath. Position was confirmed with fluoroscopy.
Images were obtained for documentation. The patient tolerated the
procedure well. No immediate complications. Incisions were closed in
a two layer fashion with 4 - 0 Vicryl suture. Dermabond was applied
to the skin. The port catheter was accessed, blood was aspirated
followed by saline and heparin flushes. Needle was removed. A dry
sterile dressing was applied.
IMPRESSION: Ultrasound and fluoroscopically guided right internal jugular single
lumen power port catheter insertion. Tip in the SVC/RA junction.
Catheter ready for use.

## 2020-07-27 ENCOUNTER — Other Ambulatory Visit: Payer: Self-pay | Admitting: *Deleted

## 2020-07-27 DIAGNOSIS — C642 Malignant neoplasm of left kidney, except renal pelvis: Secondary | ICD-10-CM

## 2020-07-27 DIAGNOSIS — C8203 Follicular lymphoma grade I, intra-abdominal lymph nodes: Secondary | ICD-10-CM

## 2020-07-27 DIAGNOSIS — C801 Malignant (primary) neoplasm, unspecified: Secondary | ICD-10-CM

## 2020-07-27 MED ORDER — FENTANYL 25 MCG/HR TD PT72: 1.0000 | MEDICATED_PATCH | TRANSDERMAL | 0 refills | Status: DC

## 2020-08-01 ENCOUNTER — Ambulatory Visit: Payer: HMO

## 2020-08-02 ENCOUNTER — Inpatient Hospital Stay: Payer: HMO

## 2020-08-02 ENCOUNTER — Ambulatory Visit: Payer: HMO

## 2020-08-02 ENCOUNTER — Ambulatory Visit: Payer: Self-pay

## 2020-08-02 ENCOUNTER — Other Ambulatory Visit: Payer: Self-pay

## 2020-08-02 ENCOUNTER — Encounter: Payer: Self-pay | Admitting: *Deleted

## 2020-08-02 ENCOUNTER — Inpatient Hospital Stay (HOSPITAL_BASED_OUTPATIENT_CLINIC_OR_DEPARTMENT_OTHER): Payer: HMO | Admitting: Hematology & Oncology

## 2020-08-02 ENCOUNTER — Ambulatory Visit: Payer: HMO | Admitting: Hematology & Oncology

## 2020-08-02 ENCOUNTER — Inpatient Hospital Stay: Payer: HMO | Attending: Hematology & Oncology

## 2020-08-02 ENCOUNTER — Ambulatory Visit (HOSPITAL_BASED_OUTPATIENT_CLINIC_OR_DEPARTMENT_OTHER)
Admission: RE | Admit: 2020-08-02 | Discharge: 2020-08-02 | Disposition: A | Discharge status: Home / Self Care | Payer: HMO | Source: Ambulatory Visit | Attending: Hematology & Oncology | Admitting: Hematology & Oncology

## 2020-08-02 ENCOUNTER — Encounter: Payer: Self-pay | Admitting: Hematology & Oncology

## 2020-08-02 ENCOUNTER — Encounter (HOSPITAL_BASED_OUTPATIENT_CLINIC_OR_DEPARTMENT_OTHER): Payer: Self-pay

## 2020-08-02 DIAGNOSIS — C8203 Follicular lymphoma grade I, intra-abdominal lymph nodes: Secondary | ICD-10-CM | POA: Diagnosis not present

## 2020-08-02 DIAGNOSIS — Z79899 Other long term (current) drug therapy: Secondary | ICD-10-CM | POA: Insufficient documentation

## 2020-08-02 DIAGNOSIS — M7989 Other specified soft tissue disorders: Secondary | ICD-10-CM | POA: Insufficient documentation

## 2020-08-02 DIAGNOSIS — C778 Secondary and unspecified malignant neoplasm of lymph nodes of multiple regions: Secondary | ICD-10-CM | POA: Insufficient documentation

## 2020-08-02 DIAGNOSIS — R531 Weakness: Secondary | ICD-10-CM | POA: Insufficient documentation

## 2020-08-02 DIAGNOSIS — C801 Malignant (primary) neoplasm, unspecified: Secondary | ICD-10-CM

## 2020-08-02 DIAGNOSIS — R21 Rash and other nonspecific skin eruption: Secondary | ICD-10-CM | POA: Diagnosis not present

## 2020-08-02 DIAGNOSIS — M542 Cervicalgia: Secondary | ICD-10-CM | POA: Insufficient documentation

## 2020-08-02 DIAGNOSIS — R609 Edema, unspecified: Secondary | ICD-10-CM | POA: Insufficient documentation

## 2020-08-02 DIAGNOSIS — R5383 Other fatigue: Secondary | ICD-10-CM | POA: Diagnosis not present

## 2020-08-02 DIAGNOSIS — E611 Iron deficiency: Secondary | ICD-10-CM | POA: Insufficient documentation

## 2020-08-02 DIAGNOSIS — R110 Nausea: Secondary | ICD-10-CM | POA: Insufficient documentation

## 2020-08-02 DIAGNOSIS — C642 Malignant neoplasm of left kidney, except renal pelvis: Secondary | ICD-10-CM | POA: Diagnosis not present

## 2020-08-02 DIAGNOSIS — M549 Dorsalgia, unspecified: Secondary | ICD-10-CM | POA: Diagnosis not present

## 2020-08-02 DIAGNOSIS — N132 Hydronephrosis with renal and ureteral calculous obstruction: Secondary | ICD-10-CM | POA: Diagnosis not present

## 2020-08-02 DIAGNOSIS — D630 Anemia in neoplastic disease: Secondary | ICD-10-CM | POA: Insufficient documentation

## 2020-08-02 DIAGNOSIS — J9811 Atelectasis: Secondary | ICD-10-CM | POA: Diagnosis not present

## 2020-08-02 DIAGNOSIS — C649 Malignant neoplasm of unspecified kidney, except renal pelvis: Secondary | ICD-10-CM | POA: Diagnosis not present

## 2020-08-02 DIAGNOSIS — I700 Atherosclerosis of aorta: Secondary | ICD-10-CM | POA: Diagnosis not present

## 2020-08-02 DIAGNOSIS — R0602 Shortness of breath: Secondary | ICD-10-CM | POA: Diagnosis not present

## 2020-08-02 DIAGNOSIS — I2510 Atherosclerotic heart disease of native coronary artery without angina pectoris: Secondary | ICD-10-CM | POA: Diagnosis not present

## 2020-08-02 DIAGNOSIS — K5730 Diverticulosis of large intestine without perforation or abscess without bleeding: Secondary | ICD-10-CM | POA: Diagnosis not present

## 2020-08-02 DIAGNOSIS — K909 Intestinal malabsorption, unspecified: Secondary | ICD-10-CM | POA: Diagnosis not present

## 2020-08-02 DIAGNOSIS — J90 Pleural effusion, not elsewhere classified: Secondary | ICD-10-CM | POA: Diagnosis not present

## 2020-08-02 DIAGNOSIS — R197 Diarrhea, unspecified: Secondary | ICD-10-CM | POA: Diagnosis not present

## 2020-08-02 LAB — CMP (CANCER CENTER ONLY)
ALT: 8 U/L (ref 0–44)
AST: 14 U/L — ABNORMAL LOW (ref 15–41)
Albumin: 3.6 g/dL (ref 3.5–5.0)
Alkaline Phosphatase: 52 U/L (ref 38–126)
Anion gap: 9 (ref 5–15)
BUN: 26 mg/dL — ABNORMAL HIGH (ref 8–23)
CO2: 28 mmol/L (ref 22–32)
Calcium: 10 mg/dL (ref 8.9–10.3)
Chloride: 105 mmol/L (ref 98–111)
Creatinine: 0.78 mg/dL (ref 0.61–1.24)
GFR, Estimated: >60 mL/min (ref >60)
Glucose, Bld: 101 mg/dL — ABNORMAL HIGH (ref 70–99)
Potassium: 4 mmol/L (ref 3.5–5.1)
Sodium: 142 mmol/L (ref 135–145)
Total Bilirubin: 0.4 mg/dL (ref 0.3–1.2)
Total Protein: 6.4 g/dL — ABNORMAL LOW (ref 6.5–8.1)

## 2020-08-02 LAB — CBC WITH DIFFERENTIAL (CANCER CENTER ONLY)
Abs Immature Granulocytes: 0.03 10*3/uL (ref 0.00–0.07)
Basophils Absolute: 0.1 10*3/uL (ref 0.0–0.1)
Basophils Relative: 1 %
Eosinophils Absolute: 0.4 10*3/uL (ref 0.0–0.5)
Eosinophils Relative: 6 %
HCT: 26.7 % — ABNORMAL LOW (ref 39.0–52.0)
Hemoglobin: 7.9 g/dL — ABNORMAL LOW (ref 13.0–17.0)
Immature Granulocytes: 0 %
Lymphocytes Relative: 8 %
Lymphs Abs: 0.6 10*3/uL — ABNORMAL LOW (ref 0.7–4.0)
MCH: 27.1 pg (ref 26.0–34.0)
MCHC: 29.6 g/dL — ABNORMAL LOW (ref 30.0–36.0)
MCV: 91.8 fL (ref 80.0–100.0)
Monocytes Absolute: 0.7 10*3/uL (ref 0.1–1.0)
Monocytes Relative: 9 %
Neutro Abs: 5.9 10*3/uL (ref 1.7–7.7)
Neutrophils Relative %: 76 %
Platelet Count: 375 10*3/uL (ref 150–400)
RBC: 2.91 MIL/uL — ABNORMAL LOW (ref 4.22–5.81)
RDW: 16.5 % — ABNORMAL HIGH (ref 11.5–15.5)
WBC Count: 7.7 10*3/uL (ref 4.0–10.5)
nRBC: 0 % (ref 0.0–0.2)

## 2020-08-02 LAB — SAMPLE TO BLOOD BANK

## 2020-08-02 LAB — RETICULOCYTES
Immature Retic Fract: 27.7 % — ABNORMAL HIGH (ref 2.3–15.9)
RBC.: 2.89 MIL/uL — ABNORMAL LOW (ref 4.22–5.81)
Retic Count, Absolute: 82.9 10*3/uL (ref 19.0–186.0)
Retic Ct Pct: 2.9 % (ref 0.4–3.1)

## 2020-08-02 LAB — PREPARE RBC (CROSSMATCH)

## 2020-08-02 MED ORDER — SODIUM CHLORIDE 0.9% FLUSH
10.0000 mL | INTRAVENOUS | Status: DC | PRN
  Administered 2020-08-02: 10 mL via INTRAVENOUS
  Filled 2020-08-02: qty 10

## 2020-08-02 MED ORDER — FENTANYL 25 MCG/HR TD PT72
1.0000 | MEDICATED_PATCH | TRANSDERMAL | 0 refills | Status: AC
Start: 2020-08-02 — End: ?

## 2020-08-02 MED ORDER — HYDROCODONE-ACETAMINOPHEN 7.5-325 MG PO TABS: 1.0000 | ORAL_TABLET | Freq: Four times a day (QID) | ORAL | 0 refills | Status: AC | PRN

## 2020-08-02 MED ORDER — HEPARIN SOD (PORK) LOCK FLUSH 100 UNIT/ML IV SOLN
500.0000 [IU] | Freq: Once | INTRAVENOUS | Status: AC
  Administered 2020-08-02: 500 [IU] via INTRAVENOUS
  Filled 2020-08-02: qty 5

## 2020-08-02 MED ORDER — IOHEXOL 300 MG/ML  SOLN
100.0000 mL | Freq: Once | INTRAMUSCULAR | Status: AC | PRN
  Administered 2020-08-02: 100 mL via INTRAVENOUS

## 2020-08-02 NOTE — Patient Instructions (Signed)

## 2020-08-02 NOTE — Progress Notes (Signed)
Hematology and Oncology Follow Up Visit  Kenneth Lyons [id number] [birth year] 79 y.o. 08/02/2020   Principle Diagnosis:   Metastatic renal cell carcinoma of the left kidney with multiple lymph node metastasis  Anemia secondary to renal cell carcinoma  Iron deficiency anemia secondary to malabsorption  Hypercalcemia secondary to renal cell carcinoma  Current Therapy:    Nivolumab/Cabometyx --s/p cycle 1  - started on 03/27/2020 - s/p cycle #3 of Nivolumab -- d/c on 07/04/2020  Xgeva 120 mg subcu every 3 months -next dose on 07/2020  IV iron as indicated-dose given on 03/16/2020     Interim History:  Kenneth Lyons is back for in early follow-up.  Overall, he actually looks a little bit better.  He seems to be little bit more alert.  It is hard to say how things really are going with the dementia.  His 79th birthday is coming up on Monday.  Sounds like family is coming in.  I am sure he will enjoy his birthday celebration.    He did have a CT scan done today.  I should not be all that surprised as to the results.  The CT scan did show that he has progressive adenopathy.  This is adenopathy in the supraclavicular, mediastinal, periaortic and pelvic nodes.  There is some left hydronephrosis.  Is felt that this probably was removed from extrinsic compression by lymph nodes.  There does not appear to be any areas of new disease that we can see on the CT scan.  He has becoming more anemic.  This is exactly how he initially presented.  He was very anemic.  We had to transfuse him.  Once we started treatment, the anemia improved.  I do think that we probably will have to transfuse him.  I think this would probably be the best way to get his blood count up so he will be able to enjoy his birthday.  He is eating okay.  He has had no nausea or vomiting.  He see his cardiologist I think tomorrow.  I think his heart is been doing pretty well.  I think we can get the hemoglobin higher, his  heart should be able to have less stress.  He has had no fever.  He has had no headaches.  He has had no cough or increased shortness of breath.  He is having more in the way of back discomfort.  This is mostly on the right side.  I am sure this is probably from the adenopathy.  We will increase the fentanyl patch to every 48 hour change.  I would not increase the dose as of yet.  We will refill the Vicodin prescription for him.  He has been followed by the palliative care section of hospice.  Currently, his performance status is ECOG 3.  Medications:  Current Outpatient Medications:  .  alfuzosin (UROXATRAL) 10 MG 24 hr tablet, Take 10 mg by mouth daily with breakfast. , Disp: , Rfl:  .  ASPIRIN 81 PO, Take 81 mg by mouth daily., Disp: , Rfl:  .  carvedilol (COREG) 25 MG tablet, Take 25 mg by mouth 2 (two) times daily with a meal. , Disp: , Rfl:  .  Cyanocobalamin 5000 MCG SUBL, Place 5,000 mcg under the tongue daily. Place one dose under the tongue daily , Disp: , Rfl:  .  Dulaglutide (TRULICITY) 2.72 ZD/6.6YQ SOPN, Inject 0.75 mg into the skin every Wednesday. , Disp: , Rfl:  .  escitalopram (LEXAPRO) 20 MG  tablet, Take 1 tablet (20 mg total) by mouth daily., Disp: 30 tablet, Rfl: 3 .  fentaNYL (DURAGESIC) 25 MCG/HR, Place 1 patch onto the skin every 3 (three) days., Disp: 10 patch, Rfl: 0 .  furosemide (LASIX) 20 MG tablet, Take 1 tablet (20 mg total) by mouth daily., Disp: 90 tablet, Rfl: 3 .  Glucosamine-Chondroitin 250-200 MG TABS, Take 1 tablet by mouth 2 (two) times daily., Disp: , Rfl:  .  HYDROcodone-acetaminophen (NORCO) 7.5-325 MG tablet, Take 1 tablet by mouth every 6 (six) hours as needed for moderate pain., Disp: 60 tablet, Rfl: 0 .  levothyroxine (SYNTHROID) 50 MCG tablet, Take 1 tablet (50 mcg total) by mouth daily before breakfast., Disp: 30 tablet, Rfl: 4 .  memantine (NAMENDA) 10 MG tablet, Take 10 mg by mouth 2 (two) times daily. , Disp: , Rfl:  .  metFORMIN (GLUCOPHAGE)  1000 MG tablet, Take 1,000 mg by mouth 2 (two) times daily with a meal. , Disp: , Rfl:  .  ONETOUCH VERIO test strip, 1 each daily., Disp: , Rfl:  .  rosuvastatin (CRESTOR) 20 MG tablet, Take 20 mg by mouth daily., Disp: , Rfl:  .  sacubitril-valsartan (ENTRESTO) 49-51 MG, Take 1 tablet by mouth 2 (two) times daily., Disp: 60 tablet, Rfl: 11 No current facility-administered medications for this visit.  Facility-Administered Medications Ordered in Other Visits:  .  sodium chloride flush (NS) 0.9 % injection 10 mL, 10 mL, Intravenous, PRN, Volanda Napoleon, MD, 10 mL at 08/02/20 1559  Allergies: No Known Allergies  Past Medical History, Surgical history, Social history, and Family History were reviewed and updated.  Review of Systems: Review of Systems  Constitutional: Positive for appetite change, fatigue and unexpected weight change.  HENT:  Negative.   Eyes: Negative.   Respiratory: Positive for shortness of breath.   Cardiovascular: Positive for leg swelling.  Gastrointestinal: Positive for diarrhea and nausea.  Endocrine: Negative.   Genitourinary: Negative.    Musculoskeletal: Positive for back pain and neck pain.  Skin: Positive for rash.  Neurological: Negative.   Hematological: Negative.   Psychiatric/Behavioral: Negative.     Physical Exam:  height is 5\' 7"  (1.702 m) and weight is 172 lb (78 kg). His oral temperature is 97.6 F (36.4 C). His blood pressure is 160/77 (abnormal) and his pulse is 70. His respiration is 18 and oxygen saturation is 100%.   Wt Readings from Last 3 Encounters:  08/02/20 172 lb (78 kg)  07/04/20 167 lb (75.8 kg)  06/18/20 160 lb (72.6 kg)    Physical Exam Vitals reviewed.  Constitutional:      Comments: This is a pale appearing elderly looking white male.  He is is in no obvious distress.  HENT:     Head: Normocephalic and atraumatic.  Eyes:     Pupils: Pupils are equal, round, and reactive to light.  Cardiovascular:     Rate and  Rhythm: Normal rate and regular rhythm.     Heart sounds: Normal heart sounds.     Comments: Cardiac exam shows a regular rate and rhythm.  He has a 1/6 systolic ejection murmur. Pulmonary:     Effort: Pulmonary effort is normal.     Breath sounds: Normal breath sounds.  Abdominal:     General: Bowel sounds are normal.     Palpations: Abdomen is soft.  Musculoskeletal:        General: No tenderness or deformity. Normal range of motion.     Cervical back:  Normal range of motion.     Comments: His extremities shows swelling of the left arm and left leg.  This is pitting edema in both upper and lower extremities.  He has some weakness in both arms and legs.  He has decent range of motion.  He has decent pulses in his distal extremities.  Lymphadenopathy:     Cervical: No cervical adenopathy.  Skin:    General: Skin is warm and dry.     Findings: No erythema or rash.     Comments: He does have a mild erythematous rash in both inguinal areas.  I do not see any obvious exudate.  There is no open areas of skin.  Neurological:     Mental Status: He is alert and oriented to person, place, and time.  Psychiatric:        Behavior: Behavior normal.        Thought Content: Thought content normal.        Judgment: Judgment normal.      Lab Results  Component Value Date   WBC 7.7 08/02/2020   HGB 7.9 (L) 08/02/2020   HCT 26.7 (L) 08/02/2020   MCV 91.8 08/02/2020   PLT 375 08/02/2020     Chemistry      Component Value Date/Time   NA 142 08/02/2020 1321   NA 139 04/10/2020 1157   K 4.0 08/02/2020 1321   CL 105 08/02/2020 1321   CO2 28 08/02/2020 1321   BUN 26 (H) 08/02/2020 1321   BUN 32 (H) 04/10/2020 1157   CREATININE 0.78 08/02/2020 1321      Component Value Date/Time   CALCIUM 10.0 08/02/2020 1321   ALKPHOS 52 08/02/2020 1321   AST 14 (L) 08/02/2020 1321   ALT 8 08/02/2020 1321   BILITOT 0.4 08/02/2020 1321      Impression and Plan: Mr. Donley is a very nice 79 year old  white male.  He has metastatic renal cell carcinoma.  Thankfully, the renal cell carcinoma is only in lymph nodes and not in the organs.  I just want quality of life to be our goal.  Again, we have a disease that we cannot cure.  For right now, we will transfuse him.  We will give him 2 units of blood on Friday, October 15.  I will also see what his iron studies show.  He may need some iron.  The MCV is a little bit lower.  Again, we are not going to entertain any therapy on him.  His performance status is not that great.  His quality of life is much more important.  I probably will have him back in 3 weeks so we can check his labs and see how everything looks.    I spent over 45 minutes with Mr. Sheley and his wife.  A daughter was on the cell phone listening.   Volanda Napoleon, MD 10/14/20215:13 PM

## 2020-08-03 ENCOUNTER — Inpatient Hospital Stay: Payer: HMO

## 2020-08-03 ENCOUNTER — Telehealth: Payer: Self-pay | Admitting: Hematology & Oncology

## 2020-08-03 VITALS — BP 161/67 | HR 69 | Temp 97.7°F | Resp 17

## 2020-08-03 DIAGNOSIS — C642 Malignant neoplasm of left kidney, except renal pelvis: Secondary | ICD-10-CM

## 2020-08-03 DIAGNOSIS — D500 Iron deficiency anemia secondary to blood loss (chronic): Secondary | ICD-10-CM

## 2020-08-03 DIAGNOSIS — D630 Anemia in neoplastic disease: Secondary | ICD-10-CM

## 2020-08-03 LAB — LACTATE DEHYDROGENASE: LDH: 274 U/L — ABNORMAL HIGH (ref 98–192)

## 2020-08-03 LAB — IRON AND TIBC
Iron: 29 ug/dL — ABNORMAL LOW (ref 42–163)
Saturation Ratios: 11 % — ABNORMAL LOW (ref 20–55)
TIBC: 269 ug/dL (ref 202–409)
UIBC: 239 ug/dL (ref 117–376)

## 2020-08-03 LAB — FERRITIN: Ferritin: 639 ng/mL — ABNORMAL HIGH (ref 24–336)

## 2020-08-03 MED ORDER — SODIUM CHLORIDE 0.9% FLUSH
10.0000 mL | INTRAVENOUS | Status: AC | PRN
  Administered 2020-08-03: 10 mL
  Filled 2020-08-03: qty 10

## 2020-08-03 MED ORDER — SODIUM CHLORIDE 0.9% IV SOLUTION
250.0000 mL | Freq: Once | INTRAVENOUS | Status: AC
  Administered 2020-08-03: 250 mL via INTRAVENOUS
  Filled 2020-08-03: qty 250

## 2020-08-03 MED ORDER — DENOSUMAB 120 MG/1.7ML ~~LOC~~ SOLN: 120.0000 mg | Freq: Once | SUBCUTANEOUS | Status: DC

## 2020-08-03 MED ORDER — SODIUM CHLORIDE 0.9 % IV SOLN
200.0000 mg | Freq: Once | INTRAVENOUS | Status: AC
  Administered 2020-08-03: 200 mg via INTRAVENOUS
  Filled 2020-08-03: qty 200

## 2020-08-03 MED ORDER — DENOSUMAB 120 MG/1.7ML ~~LOC~~ SOLN
SUBCUTANEOUS | Status: AC
  Filled 2020-08-03: qty 1.7

## 2020-08-03 MED ORDER — HEPARIN SOD (PORK) LOCK FLUSH 100 UNIT/ML IV SOLN
500.0000 [IU] | Freq: Every day | INTRAVENOUS | Status: AC | PRN
  Administered 2020-08-03: 500 [IU]
  Filled 2020-08-03: qty 5

## 2020-08-03 NOTE — Patient Instructions (Signed)

## 2020-08-03 NOTE — Progress Notes (Signed)
Pt discharged in no apparent distress. Pt left ambulatory without assistance. Pt aware of discharge instructions and verbalized understanding and had no further questions.  

## 2020-08-03 NOTE — Progress Notes (Signed)
Oncology Nurse Navigator Documentation  Oncology Nurse Navigator Flowsheets 08/02/2020  Abnormal Finding Date -  Diagnosis Status -  Planned Course of Treatment -  Phase of Treatment -  Chemotherapy Pending- Reason: -  Chemotherapy Actual Start Date: -  Navigator Follow Up Date: 08/23/2020  Navigator Follow Up Reason: Follow-up Appointment  Navigator Location [facility]  Referral Date to RadOnc/MedOnc -  Navigator Encounter Type Follow-up Appt;Appt/Treatment Plan Review  Telephone -  Treatment Initiated Date -  Patient Visit Type MedOnc  Treatment Phase Post-Tx Follow-up  Barriers/Navigation Needs Anxiety;Family Concerns;Morbidities/Frailty  Education -  Interventions Psycho-Social Support  Acuity Level 2-Minimal Needs (1-2 Barriers Identified)  Referrals -  Coordination of Care -  Education Method -  Support Groups/Services Friends and Family  Time Spent with Patient 15

## 2020-08-03 NOTE — Telephone Encounter (Signed)
Appointments scheduled calendar printed & mailed per 10/14 los

## 2020-08-04 LAB — TYPE AND SCREEN
ABO/RH(D): O POS
Antibody Screen: NEGATIVE
Unit division: 0
Unit division: 0

## 2020-08-04 LAB — BPAM RBC
Blood Product Expiration Date: 202111122359
Blood Product Expiration Date: 202111142359
ISSUE DATE / TIME: 202110150748
ISSUE DATE / TIME: 202110150748
Unit Type and Rh: 5100
Unit Type and Rh: 5100

## 2020-08-05 NOTE — Progress Notes (Signed)
Cardiology Office Note:   Date:  08/06/2020  NAME:  Kenneth Lyons    MRN: [id number] DOB:  January 24, 1941   PCP:  Nicola Girt, DO  Cardiologist:  Evalina Field, MD   Referring MD: Nicola Girt, DO   Chief Complaint  Patient presents with  . Congestive Heart Failure   History of Present Illness:   Kenneth Lyons is a 79 y.o. male with a hx of CAD, systolic HF, HTN, DM, metastatic RCC, anemia requiring intermittent transfusion who presents for follow-up.   He presents with his wife.  They report they have stopped cancer treatment.  Most recent CT scan shows progression of cancer.  He also has compression of the ureter.  No issues with urination.  He has gained nearly 10 pound since her last visit.  Apparently has had liberal intake of salt and fluid.  Is also had several transfusions due to his chronic anemia.  Apparently they have entered the hospice and palliative care arena.  This is new to them.  They have not been given a timeframe of life expectancy.  He denies any shortness of breath.  Still getting quite fatigued.  Anemia and fluid retention a big issue here.  They present with a blood pressure log.  Blood pressures have ranged from the 120s to 160s.  He is still on Coreg and Entresto.  He is taking Lasix 20 mg daily.  We did go over the fact that his life expectancy is limited.  Given his progressive cancer and underlying conditions hospice and palliative approach are warranted.  I recommended a less aggressive approach.  He should be allowed to do things he enjoys.  If things get worse they have expressed a desire to not return to the hospital.  I think they need to be guided better by the palliative and hospice unit.  He denies any chest pain or shortness of breath today in office.  Lower extremities with 2+ pitting edema.  JVD present.  Problem List 1. Systolic HF, EF 60-10% with WMA -presumed ischemic etiology but due to anemia and RCC not cath candidate  2.  CAD -NSTEMI 03/19/2020 with WMA consistent with LAD infarct -unable to treat due to anemia  3. CHB s/p ppm 4. Metastatic RCC -c/b anemia  5. HTN 6. DM -A1c 6.5 7. Mild aortic stenosis   Past Medical History: Past Medical History:  Diagnosis Date  . Alzheimer's dementia ([place])   . Anemia associated with left renal cell cancer treated with erythropoietin ([place]) 03/14/2020  . Cancer ([place])   . Diabetes mellitus   . GERD (gastroesophageal reflux disease)   . Goals of care, counseling/discussion 02/28/2020  . High cholesterol   . Hypercalcemia of malignancy 03/14/2020  . Hypertension   . Iron deficiency anemia due to chronic blood loss 02/29/2020  . Kidney cancer, primary, with metastasis from kidney to other site, left ([place]) 03/14/2020  . Lymphoma ([place])   . 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 Malignant neoplasm of kidney metastatic to lymph nodes of multiple regions ([place]) 03/14/2020  . Stroke [facility])     Past Surgical History: Past Surgical History:  Procedure Laterality Date  . APPENDECTOMY    . IR IMAGING GUIDED PORT INSERTION  03/20/2020  . IR US GUIDE BX ASP/DRAIN  03/08/2020  . PACEMAKER INSERTION Left 08/31/2012   SJM model Accent RF PM2210 (SN [id number]) dual chamber pacemaker implanted by Dr Jonetta Osgood for complete heart block  . PROSTATE SURGERY    .  TONSILLECTOMY      Current Medications: Current Meds  Medication Sig  . alfuzosin (UROXATRAL) 10 MG 24 hr tablet Take 10 mg by mouth daily with breakfast.   . ASPIRIN 81 PO Take 81 mg by mouth daily.  . carvedilol (COREG) 25 MG tablet Take 25 mg by mouth 2 (two) times daily with a meal.   . Cyanocobalamin 5000 MCG SUBL Place 5,000 mcg under the tongue daily. Place one dose under the tongue daily   . Dulaglutide (TRULICITY) 8.18 EX/9.3ZJ SOPN Inject 0.75 mg into the skin every Wednesday.   . escitalopram (LEXAPRO) 20 MG tablet Take 1 tablet (20 mg total) by mouth daily.  . fentaNYL (DURAGESIC) 25 MCG/HR Place 1 patch  onto the skin every other day.  . furosemide (LASIX) 40 MG tablet Take 1 tablet (40 mg total) by mouth daily.  . Glucosamine-Chondroitin 250-200 MG TABS Take 1 tablet by mouth 2 (two) times daily.  Marland Kitchen HYDROcodone-acetaminophen (NORCO) 7.5-325 MG tablet Take 1 tablet by mouth every 6 (six) hours as needed for moderate pain.  Marland Kitchen levothyroxine (SYNTHROID) 50 MCG tablet Take 1 tablet (50 mcg total) by mouth daily before breakfast.  . memantine (NAMENDA) 10 MG tablet Take 10 mg by mouth 2 (two) times daily.   . metFORMIN (GLUCOPHAGE) 1000 MG tablet Take 1,000 mg by mouth 2 (two) times daily with a meal.   . ONETOUCH VERIO test strip 1 each daily.  . rosuvastatin (CRESTOR) 20 MG tablet Take 20 mg by mouth daily.  . sacubitril-valsartan (ENTRESTO) 49-51 MG Take 1 tablet by mouth 2 (two) times daily.  . [DISCONTINUED] furosemide (LASIX) 20 MG tablet Take 1 tablet (20 mg total) by mouth daily.     Allergies:    Patient has no known allergies.   Social History: Social History   Socioeconomic History  . Marital status: Married    Spouse name: Not on file  . Number of children: Not on file  . Years of education: Not on file  . Highest education level: Not on file  Occupational History    Comment: Retired Engineer, maintenance (IT)  Tobacco Use  . Smoking status: Former Research scientist (life sciences)  . Smokeless tobacco: Never Used  Vaping Use  . Vaping Use: Never used  Substance and Sexual Activity  . Alcohol use: Yes    Comment: occ  . Drug use: No  . Sexual activity: Not on file  Other Topics Concern  . Not on file  Social History Narrative  . Not on file   Social Determinants of Health   Financial Resource Strain: Low Risk   . Difficulty of Paying Living Expenses: Not hard at all  Food Insecurity: No Food Insecurity  . Worried About Charity fundraiser in the Last Year: Never true  . Ran Out of Food in the Last Year: Never true  Transportation Needs: No Transportation Needs  . Lack of Transportation (Medical): No  . Lack  of Transportation (Non-Medical): No  Physical Activity: Inactive  . Days of Exercise per Week: 0 days  . Minutes of Exercise per Session: 0 min  Stress: No Stress Concern Present  . Feeling of Stress : Only a little  Social Connections: Unknown  . Frequency of Communication with Friends and Family: More than three times a week  . Frequency of Social Gatherings with Friends and Family: Not on file  . Attends Religious Services: Not on file  . Active Member of Clubs or Organizations: Not on file  . Attends Club  or Organization Meetings: Not on file  . Marital Status: Married     Family History: The patient's family history includes Breast cancer in his daughter; Cancer in his daughter and father; Cancer - Prostate in his father; Cataracts in his father, maternal grandfather, maternal grandmother, mother, paternal grandfather, and paternal grandmother. There is no history of Other, Anxiety disorder, Ataxia, Chorea, Dementia, Depression, Diabetes, Headache, Heart disease, Intellectual disability, Multiple sclerosis, Neurofibromatosis, Neuropathy, Parkinsonism, Seizures, or Stroke.  ROS:   All other ROS reviewed and negative. Pertinent positives noted in the HPI.     EKGs/Labs/Other Studies Reviewed:   The following studies were personally reviewed by me today:  TTE 03/17/2020 1. Definity used; anteroseptal and apical akinesis with overall moderate  LV dysfunction; grade 1 diastolic dysfunction; mild LVH; mild AS (mean  gradient 11 mmHg); mild MR; moderate LAE.  2. Left ventricular ejection fraction, by estimation, is 35 to 40%. The  left ventricle has moderately decreased function. The left ventricle  demonstrates regional wall motion abnormalities (see scoring  diagram/findings for description). There is mild  left ventricular hypertrophy. Left ventricular diastolic parameters are  consistent with Grade I diastolic dysfunction (impaired relaxation).  Elevated left atrial pressure.   3. Right ventricular systolic function is normal. The right ventricular  size is normal. There is moderately elevated pulmonary artery systolic  pressure.  4. Left atrial size was moderately dilated.  5. The mitral valve is normal in structure. Mild mitral valve  regurgitation. No evidence of mitral stenosis.  6. The aortic valve is tricuspid. Aortic valve regurgitation is not  visualized. Mild aortic valve stenosis.  7. The inferior vena cava is dilated in size with <50% respiratory  variability, suggesting right atrial pressure of 15 mmHg.   Recent Labs: 03/16/2020: B Natriuretic Peptide 512.5 03/17/2020: Magnesium 2.0 06/15/2020: TSH 2.603 08/02/2020: ALT 8; BUN 26; Creatinine 0.78; Hemoglobin 7.9; Platelet Count 375; Potassium 4.0; Sodium 142   Recent Lipid Panel No results found for: CHOL, TRIG, HDL, CHOLHDL, VLDL, LDLCALC, LDLDIRECT  Physical Exam:   VS:  BP (!) 154/56   Pulse 67   Ht 5\' 7"  (1.702 m)   Wt 175 lb 9.6 oz (79.7 kg)   SpO2 96%   BMI 27.50 kg/m    Wt Readings from Last 3 Encounters:  08/06/20 175 lb 9.6 oz (79.7 kg)  08/02/20 172 lb (78 kg)  07/04/20 167 lb (75.8 kg)    General: Well nourished, well developed, in no acute distress Heart: Atraumatic, normal size  Eyes: PEERLA, EOMI  Neck: Supple, +JVD Endocrine: No thryomegaly Cardiac: Normal S1, S2; RRR; no murmurs, rubs, or gallops Lungs: Clear to auscultation bilaterally, no wheezing, rhonchi or rales  Abd: Soft, nontender, no hepatomegaly  Ext: 1+ lower extremity edema Musculoskeletal: No deformities, BUE and BLE strength normal and equal Skin: Warm and dry, no rashes   Neuro: Alert and oriented to person, place, time, and situation, CNII-XII grossly intact, no focal deficits  Psych: Normal mood and affect   ASSESSMENT:   Kenneth Lyons is a 79 y.o. male who presents for the following: 1. Chronic systolic heart failure ([place])   2. Coronary artery disease involving native coronary artery of  native heart without angina pectoris   3. Essential hypertension   4. Nonrheumatic aortic valve stenosis     PLAN:   1. Chronic systolic heart failure (H[facility]) -EF 35-40% wall motion abnormalities present.  He has likely an ischemic or myopathy in the setting of non-STEMI.  Unable to  undergo cardiac cath due to profound anemia.  He has renal cell carcinoma that is widely metastatic.  He is not able to tolerate aspirin.  Continues to have issues with anemia.  It appears they are entering the hospice umbrella.  I recommended no further aggressive blood pressure monitoring.  Have also recommended a more stable approach to his care.  The focus should be on quality and comfort and not aggressive markers. -He is volume up.  We will increase his Lasix to 40 mg daily.  This will be an acceptable dose. -He is on Coreg 25 mg twice daily and Entresto 49-51 mg twice daily.  He can continue these for now.  As his cancer progresses and his overall condition deteriorates I would recommend these medications to be held.  None of these are an stone.  I did reiterate this to the wife today.  2. Coronary artery disease involving native coronary artery of native heart without angina pectoris -Recent non-STEMI.  Cannot tolerate aspirin due to persistent anemia from renal cell carcinoma.  He is entering hospice and palliative care.  He can stop his Crestor.  The overall focus should be comfort and quality.  3. Essential hypertension -BP a bit elevated today.  He will continue his Coreg and Entresto.  See above discussion about palliative care.  He can stop these medications as he needs.  4. Nonrheumatic aortic valve stenosis -No further monitoring as he will pursue hospice and palliative care.  Life expectancy is less than 6 months given his widely metastatic cancer.  Mild aortic stenosis.  Disposition: Return in about 3 months (around 11/06/2020).  Medication Adjustments/Labs and Tests Ordered: Current medicines are  reviewed at length with the patient today.  Concerns regarding medicines are outlined above.  No orders of the defined types were placed in this encounter.  Meds ordered this encounter  Medications  . furosemide (LASIX) 40 MG tablet    Sig: Take 1 tablet (40 mg total) by mouth daily.    Dispense:  90 tablet    Refill:  3    Patient Instructions  Medication Instructions:  Increase your lasix to 40 mg daily *If you need a refill on your cardiac medications before your next appointment, please call your pharmacy*   Lab Work: None ordered If you have labs (blood work) drawn today and your tests are completely normal, you will receive your results only by: Marland Kitchen MyChart Message (if you have MyChart) OR . A paper copy in the mail If you have any lab test that is abnormal or we need to change your treatment, we will call you to review the results.   Testing/Procedures: None ordered   Follow-Up: At [facility], you and your health needs are our priority.  As part of our continuing mission to provide you with exceptional heart care, we have created designated Provider Care Teams.  These Care Teams include your primary Cardiologist (physician) and Advanced Practice Providers (APPs -  Physician Assistants and Nurse Practitioners) who all work together to provide you with the care you need, when you need it.  We recommend signing up for the patient portal called "MyChart".  Sign up information is provided on this After Visit Summary.  MyChart is used to connect with patients for Virtual Visits (Telemedicine).  Patients are able to view lab/test results, encounter notes, upcoming appointments, etc.  Non-urgent messages can be sent to your provider as well.   To learn more about what you can do with MyChart,  go to [email or web address].    Your next appointment:   3 month(s)  The format for your next appointment:   Virtual Visit   Provider:   Eleonore Chiquito, MD   Other  Instructions None     Time Spent with Patient: I have spent a total of 25 minutes with patient reviewing hospital notes, telemetry, EKGs, labs and examining the patient as well as establishing an assessment and plan that was discussed with the patient.  > 50% of time was spent in direct patient care.  Signed, Addison Naegeli. Audie Box, [place]  [address], [place], [place] [address] [phone]  08/06/2020 11:52 AM

## 2020-08-06 ENCOUNTER — Encounter: Payer: Self-pay | Admitting: Cardiovascular Disease

## 2020-08-06 ENCOUNTER — Other Ambulatory Visit: Payer: Self-pay | Admitting: Hematology & Oncology

## 2020-08-06 ENCOUNTER — Ambulatory Visit: Payer: HMO | Admitting: Cardiovascular Disease

## 2020-08-06 ENCOUNTER — Other Ambulatory Visit: Payer: Self-pay

## 2020-08-06 VITALS — BP 154/56 | HR 67 | Ht 67.0 in | Wt 175.6 lb

## 2020-08-06 DIAGNOSIS — I5022 Chronic systolic (congestive) heart failure: Secondary | ICD-10-CM

## 2020-08-06 DIAGNOSIS — I10 Essential (primary) hypertension: Secondary | ICD-10-CM

## 2020-08-06 DIAGNOSIS — I350 Nonrheumatic aortic (valve) stenosis: Secondary | ICD-10-CM | POA: Diagnosis not present

## 2020-08-06 DIAGNOSIS — I2510 Atherosclerotic heart disease of native coronary artery without angina pectoris: Secondary | ICD-10-CM

## 2020-08-06 MED ORDER — FUROSEMIDE 40 MG PO TABS: 40.0000 mg | ORAL_TABLET | Freq: Every day | ORAL | 3 refills | Status: AC

## 2020-08-06 NOTE — Patient Instructions (Signed)
Medication Instructions:  Increase your lasix to 40 mg daily *If you need a refill on your cardiac medications before your next appointment, please call your pharmacy*   Lab Work: None ordered If you have labs (blood work) drawn today and your tests are completely normal, you will receive your results only by: Marland Kitchen MyChart Message (if you have MyChart) OR . A paper copy in the mail If you have any lab test that is abnormal or we need to change your treatment, we will call you to review the results.   Testing/Procedures: None ordered   Follow-Up: At [facility], you and your health needs are our priority.  As part of our continuing mission to provide you with exceptional heart care, we have created designated Provider Care Teams.  These Care Teams include your primary Cardiologist (physician) and Advanced Practice Providers (APPs -  Physician Assistants and Nurse Practitioners) who all work together to provide you with the care you need, when you need it.  We recommend signing up for the patient portal called "MyChart".  Sign up information is provided on this After Visit Summary.  MyChart is used to connect with patients for Virtual Visits (Telemedicine).  Patients are able to view lab/test results, encounter notes, upcoming appointments, etc.  Non-urgent messages can be sent to your provider as well.   To learn more about what you can do with MyChart, go to [email or web address].    Your next appointment:   3 month(s)  The format for your next appointment:   Virtual Visit   Provider:   Eleonore Chiquito, MD   Other Instructions None

## 2020-08-07 ENCOUNTER — Ambulatory Visit: Payer: Self-pay

## 2020-08-07 ENCOUNTER — Other Ambulatory Visit: Payer: Self-pay

## 2020-08-07 NOTE — Patient Outreach (Signed)
  [facility]) Care Management Chronic Special Needs Program    08/07/2020  Name: Kenneth Lyons, DOB: 1941-01-26  MRN: [id number]   [name] is enrolled in a chronic special needs plan. Telephone call to wife, Mrs. Yash Cacciola for client CSNP assessment update. Unable to reach. HIPAA compliant voice message left with call back phone number and return call request.   PLAN; RNCM will attempt 2nd telephone call to client in 1 week.   Quinn Plowman RN,BSN,CCM [facility] [phone]

## 2020-08-08 ENCOUNTER — Other Ambulatory Visit: Payer: Self-pay

## 2020-08-08 NOTE — Patient Outreach (Signed)
[facility]) Care Management Chronic Special Needs Program  08/08/2020  Name: Kenneth Lyons DOB: 1941-01-11  MRN: [id number]  [name] is enrolled in a chronic special needs plan for Heart Failure. Telephone call to wife/ designated party release, Kenneth Lyons. HIPAA verified by wife for client. Wife states client continues to see his doctors regularly. She reports client saw his cardiologist on 08/06/20.  Wife reports client was informed he was obtaining weight/ fluid at his cardiology visit so client's fluid medication was adjusted.  Wife reports client is now receiving care with [facility].  Wife reports clients cancer treatment has been discontinued and he has been advised to stop his medication supplements. Wife states client continues to receive services with [facility].  Wife states she has a supportive family regarding client's care and she has hired an outside source to assist. Wife denies any further needs at this time.  Reviewed and updated care plan.   Goals Addressed            This Visit's Progress   . Caregiver will  verbalizes knowledge of Heart Attack self management skills in 6 months   On track    RN case manager will send client education article: Heart disease in diabetes and heart attack Take your medication as prescribed Follow up with your doctor as recommended  Call your doctor if you have questions     . COMPLETED: Caregiver will report notifying primary care provider of client's low blood sugar readings (36 and 50's) within 3 months.   On track    Wife reports discussing low blood sugars with provider and medications adjustments were made. Wife states clients blood sugars have been stable.     . Caregiver will verbalize knowledge of Heart Failure disease self management skills in 6 months   On track    RN case manager will send client/ caregiver education article: Heart failure, adult Review [employer] calendar sent in the mail for Heart failure information Read the 'color' heart zones and know if you are in the 'red, yellow or Ondra Deboard zones' and be prepared to discuss with your RN case manager  Weigh daily and record weights. Follow a low salt meal plan.    . Caregiver will verbalize knowledge of self management of Hypertension as evidences by BP reading of 140/90 or less; or as defined by provider   On track    Continue to check blood pressure regularly. Reviewed blood pressure targets with wife/ caregiver:  Recent blood pressure readings   Continue to take your blood pressure medication as prescribed.  Contact your doctor if you experience symptoms or have concerns regarding your blood pressure.        . Client understands the importance of follow-up with providers by attending scheduled visits   On track    Most recent primary care provider visit: 07/12/20, 02/24/20, 02/14/20, 12/13/19 Oncology follow up visits completed on 08/02/20, 04/19/20, 04/11/20, 03/14/20, and 02/28/20 Cardiology 08/06/20 Wife reports client continues to have follow up with [facility] Client admitted to palliative care services on 07/12/20 Contact [facility] or [facility] palliative care if you have questions or concerns.     . COMPLETED: Client will not report change from baseline and no repeated symptoms of stroke with in the next 3 months       Wife reports no change in clients baseline status or repeated symptoms of stroke.       . Client  will report no fall or injuries in the next 6 months   Not on track    Wife reports client has sustained 2 falls. Wife reports client is currently using a walker for ambulation . Make sure there is good lighting throughout your home . Make sure walkways are clear of clutter, cords and throw rugs . Make sure furniture is secure, sturdy and does not swivel . Grab bars are suggested near the toilet, tub and shower . Use an assistive device ( cane/ walker) if  advised by your doctor.  . Turn on the lights when you go into a dark area.  Use night- lights . Keep items that you use often in easy to reach places.  . Review medicines with your doctor. Some medicines can make you feel dizzy.  This can increase your chance of falling.    . Client will report no worsening of symptoms related to heart disease within the next 6 months       . Notify provider for symptoms of chest pain, sweating, nausea/vomiting, irregular heartbeat, palpitations, rapid heart rate, shortness of breath or dizziness or fainting. . Call 911 for severe symptoms of chest pain or shortness of breath. . Take medications as prescribed.  . Follow a low salt meal plan, limit or avoid drinks with alcohol.  . Client reported no signs or symptoms of cardiac issues.  . RN case manager will send client education article: Heart disease in diabetics    . Client/Caregiver will verbalize understanding of instructions related to self-care and safety   On track    RN case manager re-discussed fall prevention/ safety:  . Inside your home: Don't use throw rugs, use plenty of lighting, keep walkways clear of clutter, and remove tripping hazards such as cords. Be aware of small pets when walking. . For outside safety:  Slow down and look ahead, avoid problem areas avoid uneven or slippery pavement, watch out for slippery flooring, especially polished surfaces, wear proper non-rubber soled shoes, rather than flip flops or high heels and carry a cell phone and/ or medical alert device with you.  Continue to follow up with your doctor for worsening symptoms or noticeable side effects from medication.     . COMPLETED: HEMOGLOBIN A1C < 7       Congratulation Your Hgb A1c is at goal: 6.5%    . Maintain timely refills of diabetic medication as prescribed within the year .   On track    Wife reports client receives timely refills of his medications.  Medication review from medical record completed with  client's wife Continue to take your medications as prescribed.        . Maintain timely refills of Heart Failure medication as prescribed within the year        Wife reports client maintains timely refills of his heart failure medications.  Wife reports assisting client with managing his medications.  Continue to take your medications as prescribed.  Contact your RN case manager if you have difficulty obtaining your medications.     . Obtain Annual Eye (retinal)  Exam    On track    Your last documented eye exam was 08/11/19 Wife reports client has upcoming eye exam in 1 week.       . Obtain annual screen for micro albuminuria (urine) , nephropathy (kidney problems)   Not on track    Discuss with provider need for annual micro albuminuria screening.   Diabetes can affect your kidneys. It  is important for your doctor to check your urine at least once a year.  These tests show how your kidney's are working.      . COMPLETED: Obtain Hemoglobin A1C at least 2 times per year       Hgb A1C done on  12/13/19 results 7.0% and 03/12/20 results 6.5%      . Visit Primary Care Provider or Cardiologist at least 2 times per year   On track    Most recent primary care provider visit: 07/12/20, 02/24/20, 02/14/20, 12/13/19 Cardiology 08/06/20 Continue to follow up with your doctor as recommended.     . COMPLETED: Wife wil report knowledge of hypoglycemic symptoms and management in 3 months.       Wife reports knowledge of hypoglycemic symptoms. Wife reports client's low blood sugars have been addressed with provider and diabetic medication was adjusted.     . COMPLETED: wife will report client having repeat PET scan to determine prognosis in 3 months.       Per chart review PET scan completed on 06/06/20 Wife reports client being followed by [facility] palliative care. Clients cancer treatment has been discontinued.        ASSESSMENT:  Client CSNP program changed to Heart failure after  discuss with wife. Wife advised to call RNCM if assistance needed. Confirmed wife has HTA 24 hour nurse line contact phone number if needed.   Plan:  Send successful outreach letter with a copy of their individualized care plan, Send individual care plan to provider and Send educational material  Client will be outreached by [employer] RNCM in 6 months per tier level.  [employer] case manager team will follow member moving forward with assessments, care plans and documentation.      Quinn Plowman RN,BSN,CCM Chronic Care Management Coordinator [facility] [phone]    .

## 2020-08-09 ENCOUNTER — Telehealth: Payer: Self-pay | Admitting: Hematology & Oncology

## 2020-08-09 NOTE — Telephone Encounter (Signed)
I received a call from [facility] asking if OK to transfer patient to Hospice.  Per Charlsie Merles, RN OK for transfer and have Dr Marin Olp as the physician on his care.

## 2020-08-13 ENCOUNTER — Telehealth: Payer: Self-pay | Admitting: Cardiovascular Disease

## 2020-08-13 NOTE — Telephone Encounter (Signed)
I called Mr. Raymundo his wife.  They have transition to comfort care.  He is on IV morphine.  They have inquired about continuing Entresto.  I recommended to discontinue this.  We did discuss that things have progressed rapidly and he will not benefit from long-term aggressive cardiovascular care.  This includes medications.  I have recommended to stop Entresto.  They will follow up with the hospice nurse.  We are available for any other questions or concerns that they have.  I did reiterate that the focus should be on comfort and not strictly adhering to any cardiovascular medication regimen.  [place] T. Audie Box, [place]  [address], [place], [place] [address] [phone]  9:23 AM

## 2020-08-13 NOTE — Telephone Encounter (Signed)
I called the patient's wife back.  We are stopping Entresto.  No need for aggressive cardiovascular medications at this time.  They are pursuing comfort measures for his advanced metastatic malignancy.  [place] T. Audie Box, [place]  [address] [place], [place] [address] [phone]  9:24 AM

## 2020-08-15 ENCOUNTER — Encounter: Payer: Self-pay | Admitting: *Deleted

## 2020-08-15 NOTE — Progress Notes (Signed)
Patient's wife, [name], calling to inquire whether or not they should keep the 11/4 appointment as scheduled, now that patient is on hospice.  Reviewed with [name] that we can certainly see [facility] if needed, but that it was also okay for her to cancel the appointment if she felt there would be no benefit. She discusses how difficult the last transfusion was on patient and states that they would not do that again. Explained that if they were no longer interested in supportive care, that coming into the office may be of little benefit and more disruptive than helpful. She will think about it, but will likely cancel his appointment. She will call back when a decision is made.    Oncology Nurse Navigator Documentation  Oncology Nurse Navigator Flowsheets 08/15/2020  Abnormal Finding Date -  Diagnosis Status -  Planned Course of Treatment -  Phase of Treatment -  Chemotherapy Pending- Reason: -  Chemotherapy Actual Start Date: -  Navigator Follow Up Date: 08/23/2020  Navigator Follow Up Reason: Follow-up Appointment  Navigator Location [facility]  Referral Date to RadOnc/MedOnc -  Navigator Encounter Type Telephone  Telephone Appt Confirmation/Clarification;Incoming Call  Treatment Initiated Date -  Patient Visit Type MedOnc  Treatment Phase Post-Tx Follow-up  Barriers/Navigation Needs Anxiety;Family Concerns;Morbidities/Frailty  Education Other  Interventions Education;Psycho-Social Support  Acuity Level 2-Minimal Needs (1-2 Barriers Identified)  Referrals -  Coordination of Care -  Education Method Verbal  Support Groups/Services Friends and Family  Time Spent with Patient 30

## 2020-08-20 ENCOUNTER — Telehealth: Payer: Self-pay | Admitting: *Deleted

## 2020-08-20 NOTE — Telephone Encounter (Signed)
Received a call from patient wife stating that she would like to cancel the appt for lab and MD this week.  Patient does not want anymore lab or treatment.  appts cancelled.

## 2020-08-23 ENCOUNTER — Encounter: Payer: Self-pay | Admitting: *Deleted

## 2020-08-23 ENCOUNTER — Inpatient Hospital Stay: Payer: HMO | Admitting: Hematology & Oncology

## 2020-08-23 ENCOUNTER — Inpatient Hospital Stay: Payer: HMO

## 2020-08-23 NOTE — Progress Notes (Signed)
Oncology Nurse Navigator Documentation  Oncology Nurse Navigator Flowsheets 08/23/2020  Abnormal Finding Date -  Diagnosis Status -  Planned Course of Treatment -  Phase of Treatment -  Chemotherapy Pending- Reason: -  Chemotherapy Actual Start Date: -  Navigator Follow Up Date: -  Navigator Follow Up Reason: -  Navigation Complete Date: 08/23/2020  Post Navigation: Continue to Follow Patient? No  Reason Not Navigating Patient: Hospice/Death  Navigator Location [facility]  Referral Date to RadOnc/MedOnc -  Navigator Encounter Type Appt/Treatment Plan Review  Telephone -  Treatment Initiated Date -  Patient Visit Type MedOnc  Treatment Phase Post-Tx Follow-up  Barriers/Navigation Needs Anxiety;Family Concerns;Morbidities/Frailty  Education -  Interventions None Required  Acuity Level 2-Minimal Needs (1-2 Barriers Identified)  Referrals -  Coordination of Care -  Education Method -  Support Groups/Services Friends and Family  Time Spent with Patient 15

## 2020-08-27 ENCOUNTER — Other Ambulatory Visit: Payer: Self-pay | Admitting: Family Medicine

## 2020-08-27 DIAGNOSIS — R600 Localized edema: Secondary | ICD-10-CM

## 2020-08-28 ENCOUNTER — Ambulatory Visit
Admission: RE | Admit: 2020-08-28 | Discharge: 2020-08-28 | Disposition: A | Discharge status: Home / Self Care | Payer: HMO | Source: Ambulatory Visit | Attending: Family Medicine | Admitting: Family Medicine

## 2020-08-28 DIAGNOSIS — R600 Localized edema: Secondary | ICD-10-CM | POA: Diagnosis not present

## 2020-09-18 ENCOUNTER — Other Ambulatory Visit: Payer: Self-pay

## 2020-09-18 NOTE — Patient Outreach (Signed)
  [facility]) Care Management Chronic Special Needs Program    09/18/2020  Name: Makih Stefanko, DOB: 21-May-1941  MRN: [id number]   Mr. Glenda Kunst is enrolled in a chronic special needs plan for Diabetes.  [facility] will continue to provide services for this member through 10/19/20.  The [employer] care management team will assume care 10/20/2020.   Quinn Plowman RN,BSN,CCM [facility] [phone]

## 2020-09-28 ENCOUNTER — Ambulatory Visit: Payer: HMO

## 2020-10-01 LAB — CUP PACEART REMOTE DEVICE CHECK
Battery Remaining Longevity: 29 mo
Battery Remaining Percentage: 25 %
Battery Voltage: 2.8 V
Brady Statistic AP VP Percent: 45 %
Brady Statistic AP VS Percent: 1 %
Brady Statistic AS VP Percent: 54 %
Brady Statistic AS VS Percent: 1 %
Brady Statistic RA Percent Paced: 44 %
Brady Statistic RV Percent Paced: 99 %
Date Time Interrogation Session: 20211210124054
Implantable Pulse Generator Implant Date: 20131112
Lead Channel Impedance Value: 310 Ohm
Lead Channel Impedance Value: 350 Ohm
Lead Channel Pacing Threshold Amplitude: 0.625 V
Lead Channel Pacing Threshold Amplitude: 1.375 V
Lead Channel Pacing Threshold Pulse Width: 0.4 ms
Lead Channel Pacing Threshold Pulse Width: 0.4 ms
Lead Channel Sensing Intrinsic Amplitude: 12 mV
Lead Channel Sensing Intrinsic Amplitude: 2.5 mV
Lead Channel Setting Pacing Amplitude: 1.625
Lead Channel Setting Pacing Amplitude: 1.625
Lead Channel Setting Pacing Pulse Width: 0.4 ms
Lead Channel Setting Sensing Sensitivity: 4 mV
Pulse Gen Model: 2210
Pulse Gen Serial Number: 7417603

## 2020-10-08 ENCOUNTER — Telehealth: Payer: Self-pay | Admitting: *Deleted

## 2020-10-08 NOTE — Telephone Encounter (Signed)
Spoke with Janelle at [facility] regarding patient's death. He passed away October 20, 2020 at 2:40 pm. Message will be given to MD.

## 2020-10-09 ENCOUNTER — Other Ambulatory Visit: Payer: Self-pay

## 2020-10-09 NOTE — Patient Outreach (Signed)
  [facility]) Care Management Chronic Special Needs Program    10/09/2020  Name: Kenneth Lyons, DOB: 1941-04-03  MRN: [id number]   Notification received. Client deceased.  Case closed.   Quinn Plowman RN,BSN,CCM [facility] [phone]

## 2020-10-20 DEATH — deceased

## 2020-11-09 ENCOUNTER — Telehealth: Payer: HMO | Admitting: Cardiovascular Disease

## 2021-01-03 IMAGING — US US EXTREM LOW VENOUS*L*
1 series · 13 of 24 positions shown · non-contrast
Comparison: None.

CLINICAL DATA: 79-year-old male with history of left leg edema

EXAM:
LEFT LOWER EXTREMITY VENOUS DOPPLER ULTRASOUND
TECHNIQUE: Gray-scale sonography with graded compression, as well as color
Doppler and duplex ultrasound were performed to evaluate the left
lower extremity deep venous systems from the level of the common
femoral vein and including the common femoral, femoral, profunda
femoral, popliteal and calf veins including the posterior tibial,
peroneal and gastrocnemius veins when visible. Spectral Doppler was
utilized to evaluate flow at rest and with distal augmentation
maneuvers in the common femoral, femoral and popliteal veins. The
contralateral common femoral vein was also evaluated for comparison.

[Series 1: us extrem low venous*left* · 0.08mm/px · 13 of 34 slices shown]
[im 1/34]
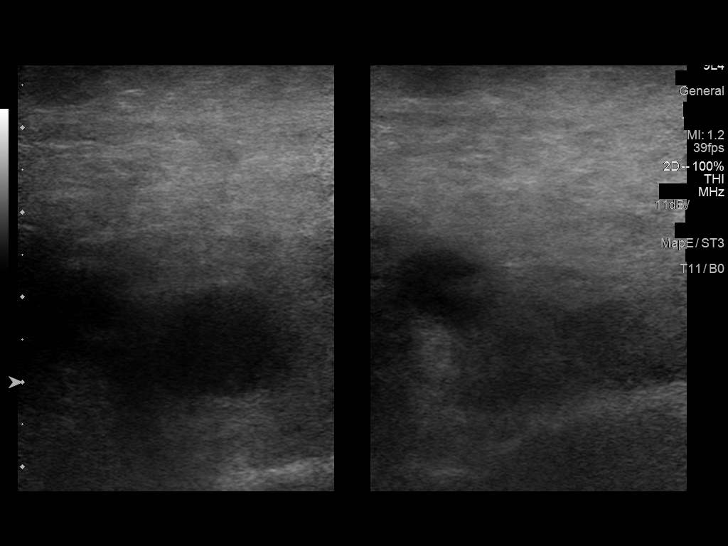
[im 3/34]
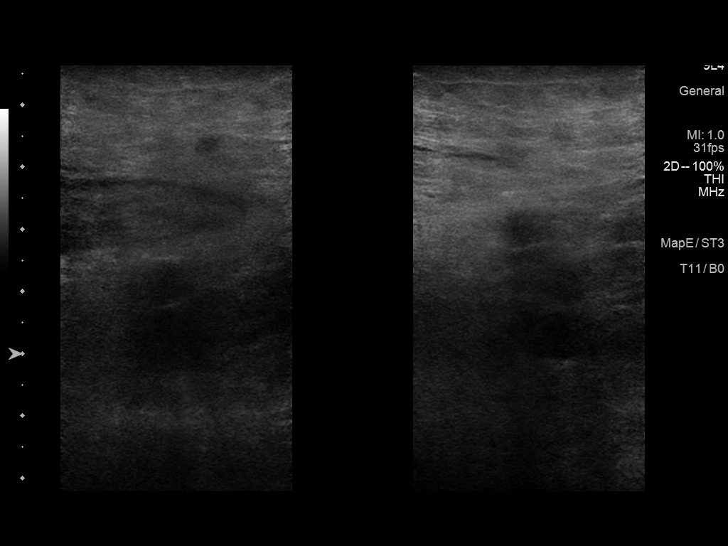
[im 6/34]
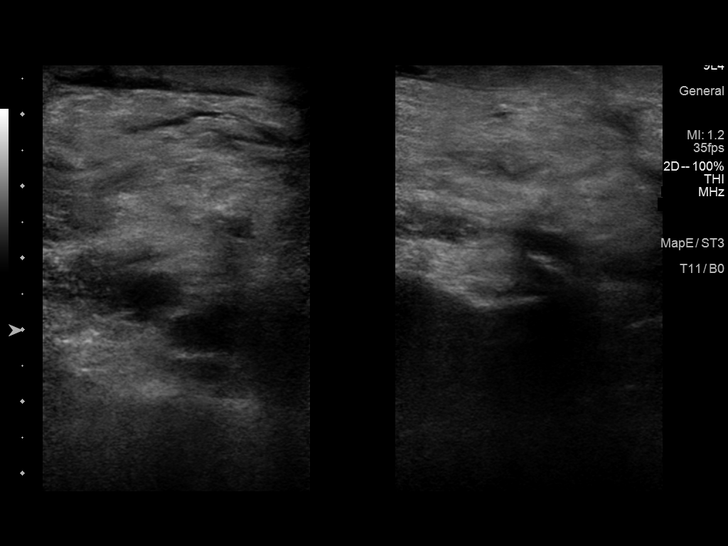
[im 9/34]
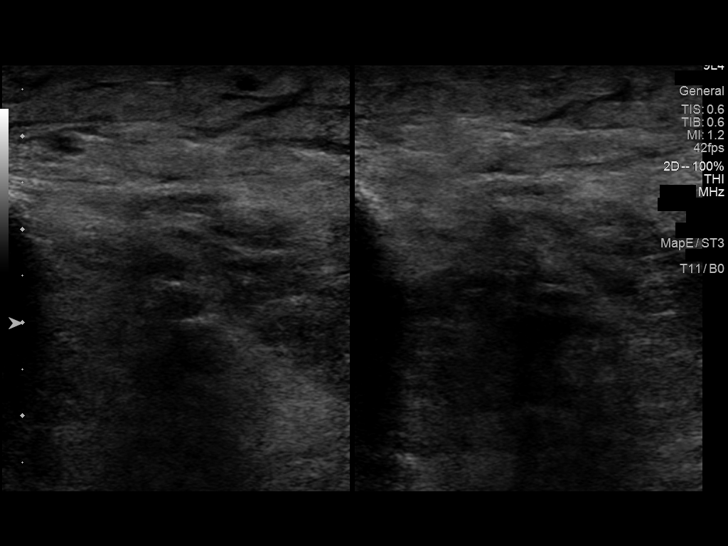
[im 12/34]
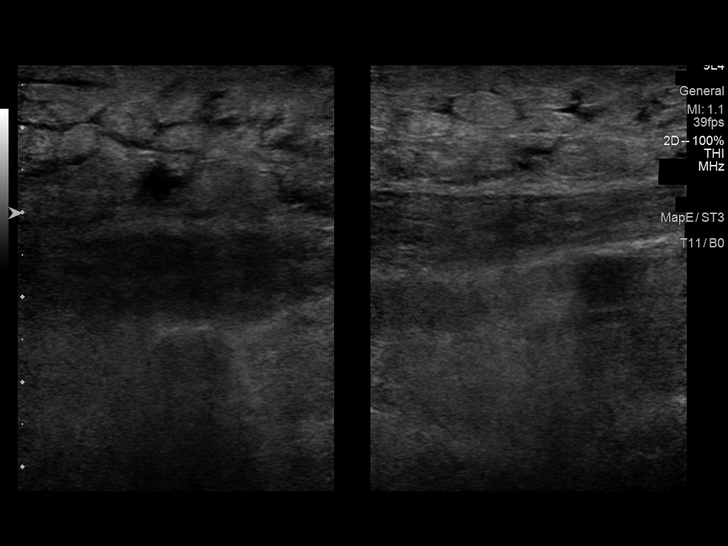
[im 15/34]
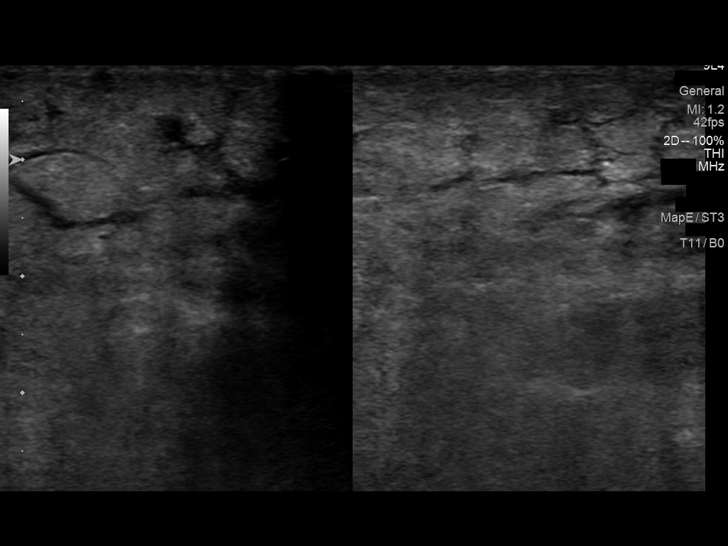
[im 18/34]
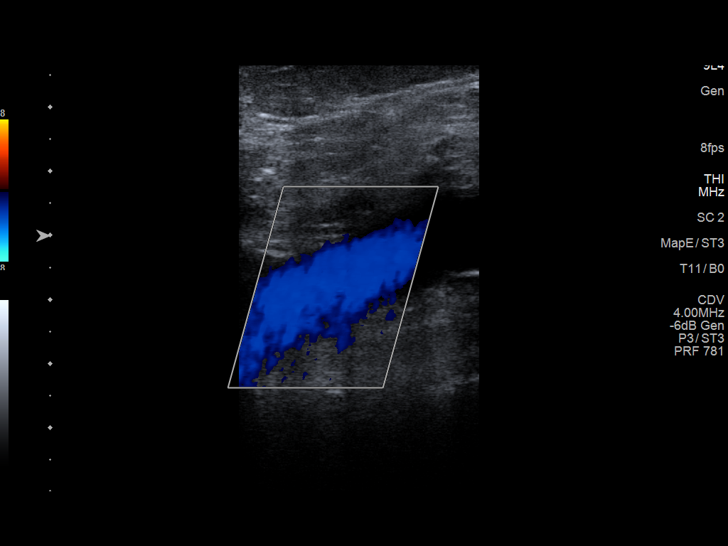
[im 19/34]
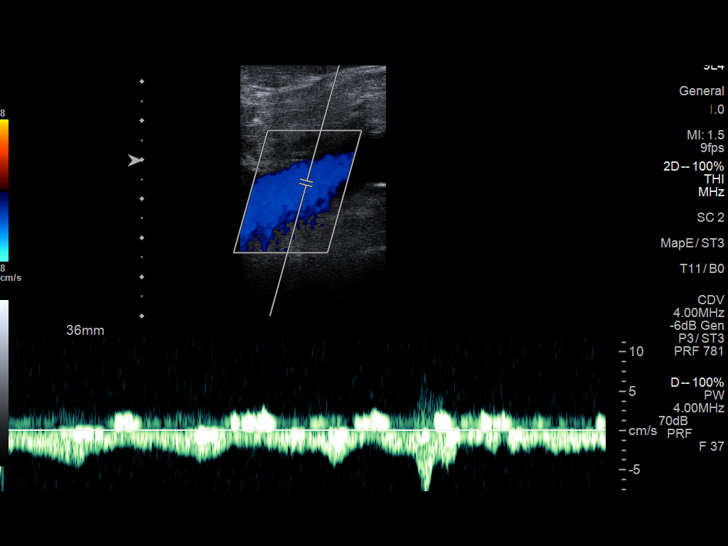
[im 22/34]
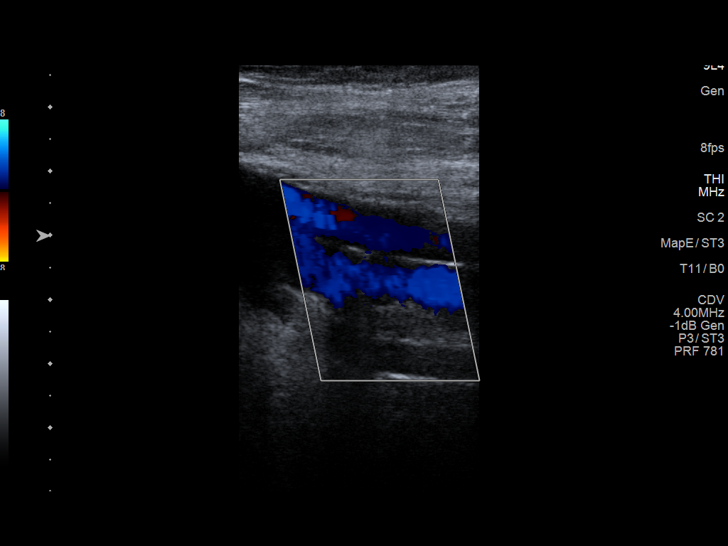
[im 25/34]
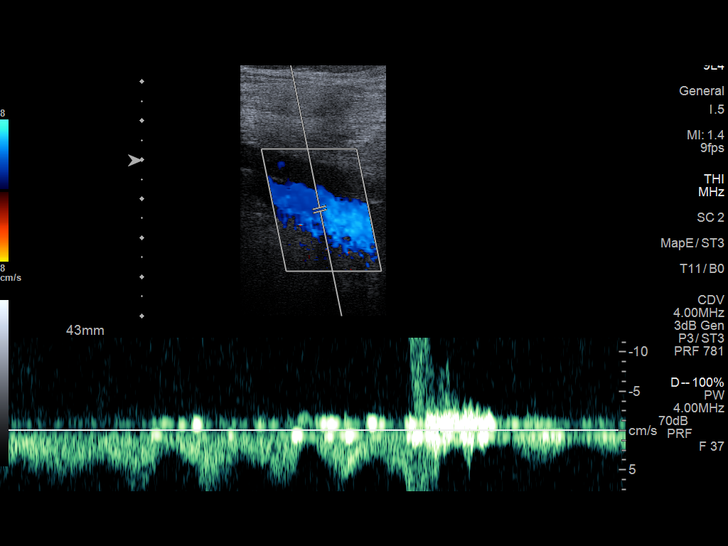
[im 28/34]
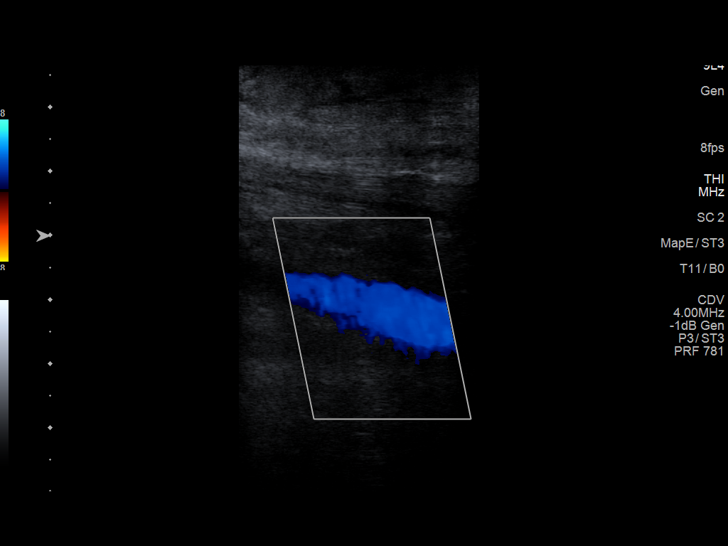
[im 31/34]
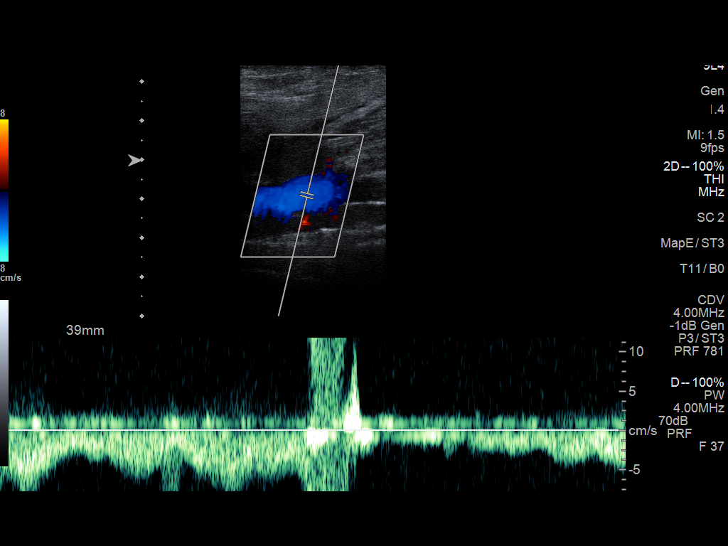
[im 34/34]
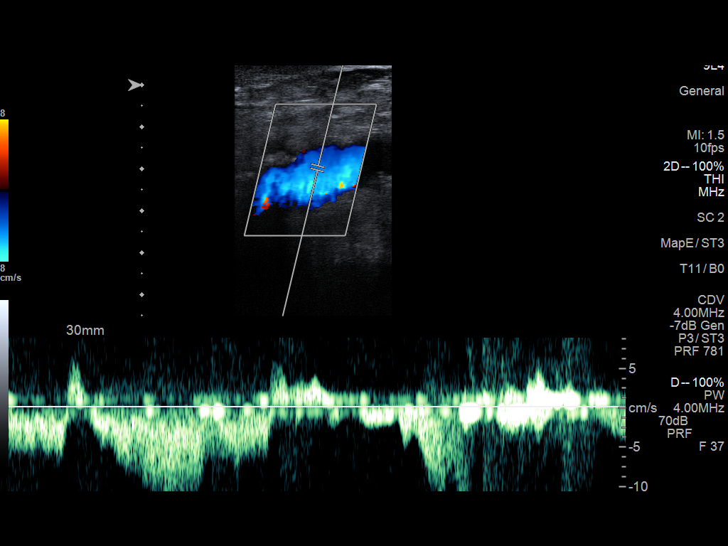

[13 of 24 positions shown; findings below may reference images not displayed]

FINDINGS: LEFT LOWER EXTREMITY

Common Femoral Vein: No evidence of thrombus. Normal
compressibility, respiratory phasicity and response to augmentation.

Central Greater Saphenous Vein: No evidence of thrombus. Normal
compressibility and flow on color Doppler imaging.

Central Profunda Femoral Vein: No evidence of thrombus. Normal
compressibility and flow on color Doppler imaging.

Femoral Vein: No evidence of thrombus. Normal compressibility,
respiratory phasicity and response to augmentation.

Popliteal Vein: No evidence of thrombus. Normal compressibility,
respiratory phasicity and response to augmentation.

Calf Veins: No evidence of thrombus. Normal compressibility and flow
on color Doppler imaging.

Other Findings: Subcutaneous edema is noted in the region of the
knee and calf.

RIGHT LOWER EXTREMITY

Common Femoral Vein: No evidence of thrombus. Normal
compressibility, respiratory phasicity and response to augmentation.
IMPRESSION: No evidence of left lower extremity deep venous thrombosis.

## 2021-01-25 ENCOUNTER — Ambulatory Visit: Payer: HMO
